# Patient Record
Sex: Male | Born: 1980 | Race: Black or African American | Hispanic: No | Marital: Married | Smoking: Current some day smoker
Health system: Southern US, Community
[De-identification: ages and names within clinical notes are randomized; demographics above are authoritative.]

## PROBLEM LIST (undated history)

## (undated) DIAGNOSIS — K5792 Diverticulitis of intestine, part unspecified, without perforation or abscess without bleeding: Secondary | ICD-10-CM

## (undated) DIAGNOSIS — F419 Anxiety disorder, unspecified: Secondary | ICD-10-CM

## (undated) DIAGNOSIS — K219 Gastro-esophageal reflux disease without esophagitis: Secondary | ICD-10-CM

## (undated) HISTORY — PX: ABDOMINAL SURGERY: SHX537

---

## 2005-01-16 ENCOUNTER — Emergency Department: Payer: Self-pay | Admitting: Emergency Medicine

## 2007-04-15 ENCOUNTER — Emergency Department: Payer: Self-pay | Admitting: Emergency Medicine

## 2007-04-25 ENCOUNTER — Ambulatory Visit: Payer: Self-pay | Admitting: Otolaryngology

## 2008-09-25 HISTORY — PX: NOSE SURGERY: SHX723

## 2010-08-03 ENCOUNTER — Emergency Department: Payer: Self-pay | Admitting: Unknown Physician Specialty

## 2010-09-14 ENCOUNTER — Emergency Department: Payer: Self-pay | Admitting: Emergency Medicine

## 2011-05-23 ENCOUNTER — Emergency Department: Payer: Self-pay | Admitting: *Deleted

## 2011-12-27 DIAGNOSIS — K219 Gastro-esophageal reflux disease without esophagitis: Secondary | ICD-10-CM | POA: Insufficient documentation

## 2011-12-27 DIAGNOSIS — R030 Elevated blood-pressure reading, without diagnosis of hypertension: Secondary | ICD-10-CM | POA: Insufficient documentation

## 2011-12-27 DIAGNOSIS — H6190 Disorder of external ear, unspecified, unspecified ear: Secondary | ICD-10-CM | POA: Insufficient documentation

## 2012-05-15 ENCOUNTER — Emergency Department: Payer: Self-pay | Admitting: *Deleted

## 2012-05-15 LAB — COMPREHENSIVE METABOLIC PANEL
Alkaline Phosphatase: 67 U/L (ref 50–136)
Anion Gap: 5 — ABNORMAL LOW (ref 7–16)
Calcium, Total: 8.7 mg/dL (ref 8.5–10.1)
Co2: 29 mmol/L (ref 21–32)
EGFR (Non-African Amer.): >60
Osmolality: 282 (ref 275–301)
Potassium: 3.7 mmol/L (ref 3.5–5.1)
SGPT (ALT): 30 U/L (ref 12–78)
Sodium: 141 mmol/L (ref 136–145)
Total Protein: 7.8 g/dL (ref 6.4–8.2)

## 2012-05-15 LAB — URINALYSIS, COMPLETE
Bilirubin,UR: NEGATIVE
Glucose,UR: NEGATIVE mg/dL (ref 0–75)
Ketone: NEGATIVE
Nitrite: NEGATIVE
Protein: NEGATIVE
RBC,UR: 1 /HPF (ref 0–5)
Squamous Epithelial: <1
WBC UR: <1 /HPF (ref 0–5)

## 2012-05-15 LAB — CBC
HCT: 44.4 % (ref 40.0–52.0)
HGB: 15.2 g/dL (ref 13.0–18.0)
MCH: 29.7 pg (ref 26.0–34.0)
MCHC: 34.3 g/dL (ref 32.0–36.0)
RDW: 13.3 % (ref 11.5–14.5)

## 2012-08-23 ENCOUNTER — Emergency Department: Payer: Self-pay | Admitting: Emergency Medicine

## 2012-08-23 LAB — CBC WITH DIFFERENTIAL/PLATELET
Basophil #: 0 10*3/uL (ref 0.0–0.1)
Basophil %: 0.4 %
Eosinophil #: 0 10*3/uL (ref 0.0–0.7)
HCT: 47 % (ref 40.0–52.0)
HGB: 16.6 g/dL (ref 13.0–18.0)
Lymphocyte %: 22.5 %
MCHC: 35.3 g/dL (ref 32.0–36.0)
Monocyte %: 10.8 %
Neutrophil #: 7 10*3/uL — ABNORMAL HIGH (ref 1.4–6.5)
Neutrophil %: 65.9 %
RBC: 5.55 10*6/uL (ref 4.40–5.90)
RDW: 13.1 % (ref 11.5–14.5)

## 2012-08-23 LAB — URINALYSIS, COMPLETE
Bacteria: NONE SEEN
Nitrite: NEGATIVE
Ph: 5 (ref 4.5–8.0)
Protein: NEGATIVE
Specific Gravity: 1.027 (ref 1.003–1.030)
Squamous Epithelial: <1
WBC UR: 4 /HPF (ref 0–5)

## 2012-08-23 LAB — COMPREHENSIVE METABOLIC PANEL
Anion Gap: 7 (ref 7–16)
BUN: 18 mg/dL (ref 7–18)
Bilirubin,Total: 1.6 mg/dL — ABNORMAL HIGH (ref 0.2–1.0)
Chloride: 108 mmol/L — ABNORMAL HIGH (ref 98–107)
Co2: 23 mmol/L (ref 21–32)
Creatinine: 1 mg/dL (ref 0.60–1.30)
EGFR (African American): >60
Glucose: 91 mg/dL (ref 65–99)
Osmolality: 277 (ref 275–301)
Potassium: 3.7 mmol/L (ref 3.5–5.1)
SGOT(AST): 50 U/L — ABNORMAL HIGH (ref 15–37)
Sodium: 138 mmol/L (ref 136–145)
Total Protein: 8.5 g/dL — ABNORMAL HIGH (ref 6.4–8.2)

## 2012-08-23 LAB — LIPASE, BLOOD: Lipase: 94 U/L (ref 73–393)

## 2012-11-28 ENCOUNTER — Emergency Department: Payer: Self-pay | Admitting: Emergency Medicine

## 2012-11-28 LAB — COMPREHENSIVE METABOLIC PANEL
Anion Gap: 9 (ref 7–16)
Bilirubin,Total: 0.7 mg/dL (ref 0.2–1.0)
Chloride: 104 mmol/L (ref 98–107)
Creatinine: 0.99 mg/dL (ref 0.60–1.30)
EGFR (Non-African Amer.): >60
Osmolality: 274 (ref 275–301)
Potassium: 3.7 mmol/L (ref 3.5–5.1)
SGOT(AST): 74 U/L — ABNORMAL HIGH (ref 15–37)
SGPT (ALT): 104 U/L — ABNORMAL HIGH (ref 12–78)
Sodium: 137 mmol/L (ref 136–145)
Total Protein: 8.2 g/dL (ref 6.4–8.2)

## 2012-11-28 LAB — CBC
HGB: 15.3 g/dL (ref 13.0–18.0)
MCV: 85 fL (ref 80–100)
Platelet: 188 10*3/uL (ref 150–440)
RBC: 5.28 10*6/uL (ref 4.40–5.90)
WBC: 5.8 10*3/uL (ref 3.8–10.6)

## 2012-11-28 LAB — LIPASE, BLOOD: Lipase: 99 U/L (ref 73–393)

## 2013-03-20 ENCOUNTER — Emergency Department: Payer: Self-pay | Admitting: Emergency Medicine

## 2013-03-20 LAB — URINALYSIS, COMPLETE
Bilirubin,UR: NEGATIVE
Blood: NEGATIVE
Glucose,UR: NEGATIVE mg/dL (ref 0–75)
Leukocyte Esterase: NEGATIVE
Nitrite: NEGATIVE
Protein: NEGATIVE
RBC,UR: 1 /HPF (ref 0–5)

## 2013-03-20 LAB — COMPREHENSIVE METABOLIC PANEL
Albumin: 4.1 g/dL (ref 3.4–5.0)
Alkaline Phosphatase: 81 U/L (ref 50–136)
Anion Gap: 6 — ABNORMAL LOW (ref 7–16)
BUN: 16 mg/dL (ref 7–18)
Bilirubin,Total: 1.5 mg/dL — ABNORMAL HIGH (ref 0.2–1.0)
Co2: 27 mmol/L (ref 21–32)
EGFR (Non-African Amer.): >60
Osmolality: 278 (ref 275–301)
SGOT(AST): 41 U/L — ABNORMAL HIGH (ref 15–37)
SGPT (ALT): 63 U/L (ref 12–78)
Sodium: 139 mmol/L (ref 136–145)

## 2013-03-20 LAB — ETHANOL: Ethanol: <3 mg/dL

## 2013-03-20 LAB — CBC
MCH: 29 pg (ref 26.0–34.0)
Platelet: 191 10*3/uL (ref 150–440)
RDW: 13.6 % (ref 11.5–14.5)
WBC: 4.6 10*3/uL (ref 3.8–10.6)

## 2013-03-20 LAB — DRUG SCREEN, URINE
Barbiturates, Ur Screen: NEGATIVE (ref ?–200)
Cannabinoid 50 Ng, Ur ~~LOC~~: POSITIVE (ref ?–50)
Opiate, Ur Screen: NEGATIVE (ref ?–300)
Phencyclidine (PCP) Ur S: NEGATIVE (ref ?–25)

## 2013-03-20 LAB — TSH: Thyroid Stimulating Horm: 1.21 u[IU]/mL

## 2013-06-10 ENCOUNTER — Emergency Department: Payer: Self-pay | Admitting: Internal Medicine

## 2013-11-07 LAB — COMPREHENSIVE METABOLIC PANEL
ALBUMIN: 4.1 g/dL (ref 3.4–5.0)
Alkaline Phosphatase: 73 U/L
Anion Gap: 8 (ref 7–16)
BUN: 16 mg/dL (ref 7–18)
Bilirubin,Total: 0.7 mg/dL (ref 0.2–1.0)
Calcium, Total: 9 mg/dL (ref 8.5–10.1)
Chloride: 108 mmol/L — ABNORMAL HIGH (ref 98–107)
Co2: 23 mmol/L (ref 21–32)
Creatinine: 1.53 mg/dL — ABNORMAL HIGH (ref 0.60–1.30)
Glucose: 89 mg/dL (ref 65–99)
Osmolality: 278 (ref 275–301)
POTASSIUM: 3.6 mmol/L (ref 3.5–5.1)
SGOT(AST): 14 U/L — ABNORMAL LOW (ref 15–37)
SGPT (ALT): 27 U/L (ref 12–78)
Sodium: 139 mmol/L (ref 136–145)
TOTAL PROTEIN: 9.1 g/dL — AB (ref 6.4–8.2)

## 2013-11-07 LAB — CBC
HCT: 53.3 % — AB (ref 40.0–52.0)
HGB: 18.1 g/dL — AB (ref 13.0–18.0)
MCH: 29.5 pg (ref 26.0–34.0)
MCHC: 33.9 g/dL (ref 32.0–36.0)
MCV: 87 fL (ref 80–100)
Platelet: 206 10*3/uL (ref 150–440)
RBC: 6.13 10*6/uL — ABNORMAL HIGH (ref 4.40–5.90)
RDW: 13.5 % (ref 11.5–14.5)
WBC: 4.9 10*3/uL (ref 3.8–10.6)

## 2013-11-07 LAB — LIPASE, BLOOD: Lipase: 167 U/L (ref 73–393)

## 2013-11-08 LAB — HEMOGLOBIN
HGB: 14.5 g/dL (ref 13.0–18.0)
HGB: 14.2 g/dL (ref 13.0–18.0)

## 2013-11-08 LAB — URINALYSIS, COMPLETE
BACTERIA: NONE SEEN
BILIRUBIN, UR: NEGATIVE
Blood: NEGATIVE
Glucose,UR: NEGATIVE mg/dL (ref 0–75)
Ketone: NEGATIVE
LEUKOCYTE ESTERASE: NEGATIVE
Nitrite: NEGATIVE
PH: 5 (ref 4.5–8.0)
Protein: NEGATIVE
RBC,UR: 1 /HPF (ref 0–5)
Specific Gravity: 1.021 (ref 1.003–1.030)
Squamous Epithelial: NONE SEEN

## 2013-11-08 LAB — SEDIMENTATION RATE
ERYTHROCYTE SED RATE: 2 mm/h (ref 0–15)
ERYTHROCYTE SED RATE: 8 mm/h (ref 0–15)

## 2013-11-08 LAB — CREATININE, SERUM
CREATININE: 1.53 mg/dL — AB (ref 0.60–1.30)
EGFR (African American): >60

## 2013-11-09 ENCOUNTER — Inpatient Hospital Stay: Payer: Self-pay | Admitting: Internal Medicine

## 2013-11-09 LAB — CBC WITH DIFFERENTIAL/PLATELET
Basophil #: 0 10*3/uL (ref 0.0–0.1)
Basophil %: 0.3 %
EOS ABS: 0 10*3/uL (ref 0.0–0.7)
Eosinophil %: 1.1 %
HCT: 40.7 % (ref 40.0–52.0)
HGB: 13.8 g/dL (ref 13.0–18.0)
Lymphocyte #: 1.2 10*3/uL (ref 1.0–3.6)
Lymphocyte %: 28.1 %
MCH: 29.2 pg (ref 26.0–34.0)
MCHC: 34 g/dL (ref 32.0–36.0)
MCV: 86 fL (ref 80–100)
Monocyte #: 0.6 x10 3/mm (ref 0.2–1.0)
Monocyte %: 15.2 %
Neutrophil #: 2.3 10*3/uL (ref 1.4–6.5)
Neutrophil %: 55.3 %
Platelet: 155 10*3/uL (ref 150–440)
RBC: 4.73 10*6/uL (ref 4.40–5.90)
RDW: 13.7 % (ref 11.5–14.5)
WBC: 4.1 10*3/uL (ref 3.8–10.6)

## 2013-11-09 LAB — BASIC METABOLIC PANEL
Anion Gap: 5 — ABNORMAL LOW (ref 7–16)
BUN: 6 mg/dL — AB (ref 7–18)
CALCIUM: 8.4 mg/dL — AB (ref 8.5–10.1)
CHLORIDE: 110 mmol/L — AB (ref 98–107)
CO2: 25 mmol/L (ref 21–32)
Creatinine: 1.4 mg/dL — ABNORMAL HIGH (ref 0.60–1.30)
EGFR (African American): >60
EGFR (Non-African Amer.): >60
GLUCOSE: 85 mg/dL (ref 65–99)
OSMOLALITY: 276 (ref 275–301)
Potassium: 3.5 mmol/L (ref 3.5–5.1)
Sodium: 140 mmol/L (ref 136–145)

## 2013-11-09 LAB — MAGNESIUM: Magnesium: 1.7 mg/dL — ABNORMAL LOW

## 2013-11-09 LAB — PROTEIN, TOTAL: Total Protein: 6.1 g/dL — ABNORMAL LOW (ref 6.4–8.2)

## 2013-11-10 LAB — BASIC METABOLIC PANEL
ANION GAP: 5 — AB (ref 7–16)
BUN: 4 mg/dL — AB (ref 7–18)
CHLORIDE: 108 mmol/L — AB (ref 98–107)
Calcium, Total: 8 mg/dL — ABNORMAL LOW (ref 8.5–10.1)
Co2: 26 mmol/L (ref 21–32)
Creatinine: 1.22 mg/dL (ref 0.60–1.30)
EGFR (Non-African Amer.): >60
Glucose: 86 mg/dL (ref 65–99)
OSMOLALITY: 274 (ref 275–301)
Potassium: 3.3 mmol/L — ABNORMAL LOW (ref 3.5–5.1)
SODIUM: 139 mmol/L (ref 136–145)

## 2013-11-10 LAB — HEMOGLOBIN: HGB: 13.9 g/dL (ref 13.0–18.0)

## 2013-11-10 LAB — PROT IMMUNOELECTROPHORES(ARMC)

## 2014-05-31 ENCOUNTER — Emergency Department: Payer: Self-pay | Admitting: Student

## 2014-05-31 LAB — COMPREHENSIVE METABOLIC PANEL
Albumin: 3.9 g/dL (ref 3.4–5.0)
Alkaline Phosphatase: 83 U/L
Anion Gap: 4 — ABNORMAL LOW (ref 7–16)
BILIRUBIN TOTAL: 1 mg/dL (ref 0.2–1.0)
BUN: 17 mg/dL (ref 7–18)
CO2: 25 mmol/L (ref 21–32)
Calcium, Total: 8.7 mg/dL (ref 8.5–10.1)
Chloride: 108 mmol/L — ABNORMAL HIGH (ref 98–107)
Creatinine: 1.18 mg/dL (ref 0.60–1.30)
Glucose: 83 mg/dL (ref 65–99)
Osmolality: 275 (ref 275–301)
Potassium: 3.5 mmol/L (ref 3.5–5.1)
SGOT(AST): 16 U/L (ref 15–37)
SGPT (ALT): 34 U/L
Sodium: 137 mmol/L (ref 136–145)
Total Protein: 8 g/dL (ref 6.4–8.2)

## 2014-05-31 LAB — CBC WITH DIFFERENTIAL/PLATELET
Basophil #: 0 10*3/uL (ref 0.0–0.1)
Basophil %: 0.5 %
EOS PCT: 0.3 %
Eosinophil #: 0 10*3/uL (ref 0.0–0.7)
HCT: 46.6 % (ref 40.0–52.0)
HGB: 15.4 g/dL (ref 13.0–18.0)
LYMPHS PCT: 25 %
Lymphocyte #: 1.6 10*3/uL (ref 1.0–3.6)
MCH: 28.6 pg (ref 26.0–34.0)
MCHC: 32.9 g/dL (ref 32.0–36.0)
MCV: 87 fL (ref 80–100)
MONO ABS: 0.8 x10 3/mm (ref 0.2–1.0)
MONOS PCT: 12.9 %
NEUTROS ABS: 3.9 10*3/uL (ref 1.4–6.5)
Neutrophil %: 61.3 %
Platelet: 191 10*3/uL (ref 150–440)
RBC: 5.38 10*6/uL (ref 4.40–5.90)
RDW: 13.2 % (ref 11.5–14.5)
WBC: 6.4 10*3/uL (ref 3.8–10.6)

## 2014-05-31 LAB — URINALYSIS, COMPLETE
BLOOD: NEGATIVE
Bilirubin,UR: NEGATIVE
GLUCOSE, UR: NEGATIVE mg/dL (ref 0–75)
Ketone: NEGATIVE
Leukocyte Esterase: NEGATIVE
NITRITE: NEGATIVE
Ph: 5 (ref 4.5–8.0)
SPECIFIC GRAVITY: 1.033 (ref 1.003–1.030)
SQUAMOUS EPITHELIAL: NONE SEEN
WBC UR: NONE SEEN /HPF (ref 0–5)

## 2014-05-31 LAB — LIPASE, BLOOD: Lipase: 106 U/L (ref 73–393)

## 2015-01-15 NOTE — Consult Note (Signed)
Brief Consult Note: Diagnosis: major depression.   Patient was seen by consultant.   Consult note dictated.   Discussed with Attending MD.   Comments: Psychiatry: Patient seen. Patient currently getting treatment for depression and anxiety. Felt panicy and had passing SI but now denies any SI and shows good insight and motivation for treatment. No indication for commitment. Patient prefers to go home and has place to stay. Will suggest release from ER.  Electronic Signatures: Audery Amellapacs, Erabella Kuipers T (MD)  (Signed 27-Jun-14 17:31)  Authored: Brief Consult Note   Last Updated: 27-Jun-14 17:31 by Audery Amellapacs, Renley Gutman T (MD)

## 2015-01-16 NOTE — Consult Note (Signed)
Pt with chills and sweats, soaked his shirt.  VSS afebrile.  WBC 4.1, hgb 13.8, plt 155, creat 1.4, abd still tender LLQ, bowel sounds good.  Plan to continue treatment, often takes 3-4 days to turn diverticulitis around and switch then to oral meds.  Electronic Signatures: Scot JunElliott, Valentine Barney T (MD)  (Signed on 15-Feb-15 08:54)  Authored  Last Updated: 15-Feb-15 08:54 by Scot JunElliott, Gerilynn Mccullars T (MD)

## 2015-01-16 NOTE — H&P (Signed)
PATIENT NAME:  Matthew Chaney, Matthew Chaney MR#:  [id number] DATE OF BIRTH:  10/28/1980  DATE OF ADMISSION:  11/08/2013  REFERRING PHYSICIAN: Mark R. Quale, MD  PRIMARY CARE PHYSICIAN OR CLINIC: None.   CHIEF COMPLAINT: Abdominal pain, bright red blood per rectum.   HISTORY OF PRESENT ILLNESS: This is a 34-year-old male with significant past medical history of gastroesophageal reflux disease, history of 2 episodes of diverticulitis in the past, managed with p.o. antibiotics a few years ago. The patient presents with complaints of abdominal pain over the last 2 to 3 days, nausea, but denies any vomiting, any coffee-ground emesis. As well, the patient reported that he started having bright red blood per rectum as well, having some cold chills as well, which prompted him to come to ED. The patient reports dark tarry stools with bright red blood as well with it. The patient was afebrile upon presentation, but had tachycardia upon presentation at 123. His hemoglobin was elevated at 18.1, and creatinine 1.53. The patient received a total of 2 liters fluid boluses in the ED. Had CT abdomen and pelvis which did show evidence of diverticulosis, but no diverticulitis. Repeat hemoglobin came back at 14.5 after the fluid boluses. The patient still complains of abdominal pain, so ED discussed with GI on-call, Dr. Elliott, who recommended the patient to be admitted, be started on IV antibiotics and to be evaluated by him in the morning. The patient denies any lightheadedness, dizziness, fainting or near-syncope.   PAST MEDICAL HISTORY: GERD.   PAST SURGICAL HISTORY: None.   SOCIAL HISTORY: No smoking. No alcohol. No illicit drug use. Unemployed.   FAMILY HISTORY: Significant for diabetes mellitus. Denies any family history of inflammatory bowel disease.   ALLERGIES: VICODIN.   HOME MEDICATIONS: Nexium.    REVIEW OF SYSTEMS:  CONSTITUTIONAL: Denies any fever. Complains of chills, fatigue, weakness. Denies weight gain,  weight loss.  EYES: Denies blurry vision, double vision, inflammation, glaucoma.  ENT: Denies tinnitus, ear pain, hearing loss, epistaxis or discharge.  RESPIRATORY: Denies cough, wheezing, hemoptysis, dyspnea.  CARDIOVASCULAR: Denies chest pain, edema, arrhythmia, palpitation, syncope.  GASTROINTESTINAL: Denies any coffee-ground emesis or hematemesis. Complains of nausea, had 1 episode of vomiting. Denies any diarrhea. Reports bright red blood per rectum and dark-colored stool.  GENITOURINARY: Denies dysuria, hematuria or renal colic.  ENDOCRINE: Denies polyuria, polydipsia, heat or cold intolerance.  HEMATOLOGY: Denies anemia, easy bruising, bleeding diathesis.  INTEGUMENTARY: Denies acne, rash or skin lesion.  MUSCULOSKELETAL: Denies any swelling, gout or cramps.  NEUROLOGIC: Denies CVA, TIA, ataxia, vertigo, tremor.  PSYCHIATRIC: Denies anxiety, insomnia or depression.   PHYSICAL EXAMINATION:  VITAL SIGNS: Temperature 98.3, pulse 95, respiratory rate 18, blood pressure 128/72, saturating 98% on room air.  GENERAL: Well-nourished male who looks comfortable, in no apparent distress.  HEENT: Head atraumatic, normocephalic. Pupils equally reactive to light. Pink conjunctivae. Anicteric sclerae. Dry oral mucosa.  NECK: Supple. No thyromegaly. No JVD.  CHEST: Good air entry bilaterally. No wheezing, rales, rhonchi.  CARDIOVASCULAR: S1, S2 heard. No rubs, murmurs or gallops.  ABDOMEN: Had tenderness in bilateral right and left lower quadrant. No rebound, no guarding, and hyperperistaltic bowel sounds. No hepatosplenomegaly could be appreciated. No inguinal lymph nodes could be palpated as well.  EXTREMITIES: No edema. No clubbing. No cyanosis.  SKIN: Dry, delay in turgor.  PSYCHIATRIC: Appropriate affect. Awake, alert x3. Intact judgment and insight.  NEUROLOGIC: Cranial nerves grossly intact. Motor 5 out of 5. No focal deficits.  LYMPHATICS: No cervical lymphadenopathy. No inguinal    lymphadenopathy.  MUSCULOSKELETAL: No joint effusion or erythema.   PERTINENT LABORATORY DATA: Glucose 89, BUN 16, creatinine 1.53, sodium 139, potassium 3.6, chloride 108, ALT 27, AST 14, alkaline phosphatase 73. White blood cells 4.9, hemoglobin 18.1, repeat hemoglobin was 14.5 but this was after 2 liters of IV normal saline fluid bolus, hematocrit 53.3, platelets 206. Urinalysis negative for leukocyte esterase and nitrite.   IMAGING STUDIES: CT abdomen and pelvis with contrast showing no acute abnormality in the abdomen or pelvis to explain the patient's clinical symptoms. Moderate colonic diverticulosis, which is advanced for age; however, no CT evidence of active inflammation to suggest acute diverticulitis.   ASSESSMENT AND PLAN:  1. Abdominal pain, nausea and bright red blood per rectum. The patient is most likely having a lower gastrointestinal bleed related to his diverticulosis. His hemoglobin had significant drop, but initially it appears his hemoglobin was elevated due to dehydration, and this is supported by his elevated creatinine level, and most likely the drop in his hemoglobin is dilutional as well. The patient will be started on IV antibiotics as per GI recommendation, unclear if due to diverticulitis or possible inflammatory bowel disease as well. Will continue to monitor his hemoglobin every 8 hours. Will follow the trend as well. Will keep him on clear liquid diet. Will have him on IV Protonix 40 b.i.d. and will add ESR and CRP with the next blood work as well. Will have him on p.r.n. pain and nausea medicine.  2. Gastroesophageal reflux disease. The patient is on PPI.  3. Deep vein thrombosis prophylaxis. Will add on sequential compression devices. Will avoid chemical anticoagulation due to lower gastrointestinal bleed.   CODE STATUS: Full code.   TOTAL TIME SPENT ON ADMISSION AND PATIENT CARE: 45 minutes.   ____________________________ Albertine Patricia,  MD dse:lb D: 11/08/2013 05:15:36 ET T: 11/08/2013 05:44:57 ET JOB#: [id number]  cc: Albertine Patricia, MD, <Dictator> Kyrielle Urbanski Graciela Husbands MD ELECTRONICALLY SIGNED 11/09/2013 0:05

## 2015-01-16 NOTE — Discharge Summary (Signed)
PATIENT NAME:  Matthew LodgeBALDWIN, Ram MR#:  [id number] DATE OF BIRTH:  Feb 18, 1981  DATE OF ADMISSION:  11/09/2013 DATE OF DISCHARGE:  11/11/2013  DISCHARGE DIAGNOSES: 1.  Acute diverticulitis.  2.  Acute blood loss anemia.  3.  Acute renal failure acute renal failure secondary to acute tubular necrosis and dehydration.  4.  Hypokalemia.   CONSULTATIONS:  Dr. Mechele CollinElliott with GI.   PROCEDURES: None.   IMAGING STUDIES DONE: Include a CT scan of the abdomen and pelvis with contrast which showed significant colonic diverticulosis which is advanced for his age. No acute abnormalities.   HISTORY OF PRESENTING ILLNESS: A 34 year old [place] patient with history of prior diverticulitis, presented to the hospital complaining of blood in his stools, acute onset of abdominal pain of the left lower quadrant with fever. The patient was admitted to the hospitalist service with high suspicion for diverticulitis with GI bleed, for acute diverticulitis clinically in spite of being CT negative. The patient was started on broad-spectrum antibiotics. Dr. Mechele CollinElliott with GI was consulted.  The patient, by the day of discharge, has tolerated his food well, does not have any further bleeding. His bleeding was thought to be secondary to diverticulitis. Hemoglobin has decreased secondary to dilution and also blood loss but presently is at 30.5. The patient feels well with minimal abdominal pain well controlled with pain medications and is being discharged back home. The patient will follow up with Dr. Mechele CollinElliott for colonoscopy once his acute illness resolves.   The patient also had acute renal failure secondary to severe dehydration. This has resolved with IV fluids. His hypokalemia was corrected through IV and orally.   Prior to discharge, the patient has mild tenderness in the left lower quadrant, much improved from admission.   DISCHARGE MEDICATIONS: Include:  1.  Nexium 20 mg oral once a day.  2.  Oxycodone 5 mg 1  to 2 tablets every 8 hours as needed, 5-day prescription given.  3.  Ciprofloxacin 500 mg oral 2 times a day for 1 week.  4.  Flagyl 500 mg oral 3 times a day for 1 week.   DISCHARGE INSTRUCTIONS: Regular diet, regular activity. Follow up with Dr. Mechele CollinElliott in 2 to 4 weeks and PCP in 1 week.   TIME SPENT ON DAY OF DISCHARGE IN DISCHARGE ACTIVITY: Was 40 minutes.   ____________________________ Molinda BailiffSrikar R. Rosely Fernandez, MD srs:cs D: 11/12/2013 13:53:54 ET T: 11/12/2013 14:05:43 ET JOB#: [id number]  cc: Wardell HeathSrikar R. Elpidio AnisSudini, MD, <Dictator> Scot Junobert T. Elliott, MD Orie FishermanSRIKAR R Ah Bott MD ELECTRONICALLY SIGNED 11/13/2013 14:14

## 2015-01-16 NOTE — Consult Note (Signed)
Pt consult done.  he had BRBPR and black but he took Pepto Bismol over last 2 days.  His serum protein is elevated so i will order a SPIE, hep C, and also get CRP and sed rate.  Probable diverticulitis not seen on CT.  Will require eventual colonoscopy to rule out other causes of LGI bleeding.  Electronic Signatures: Scot JunElliott, Aaira Oestreicher T (MD)  (Signed on 14-Feb-15 14:29)  Authored  Last Updated: 14-Feb-15 14:29 by Scot JunElliott, Jaquann Guarisco T (MD)

## 2015-01-16 NOTE — Consult Note (Signed)
PATIENT NAME:  Matthew LodgeBALDWIN, Garwood MR#:  [id number] DATE OF BIRTH:  1980-10-05  DATE OF CONSULTATION:  11/08/2013  CONSULTING PHYSICIAN:  Scot Junobert T. Elliott, MD  The patient is a 34 year old black male who presented with abdominal pain over the last few days. Started having bright red blood per rectum. Also reported dark tarry stools with the red blood. Came to the ER, was found to have a tachycardia at 124. Creatinine 1.53. Hemoglobin 18. He received a couple of liters of fluids. Repeat hemoglobin was 14.5. A CAT scan showed diverticulosis but not diverticulitis. I was consulted at 4:30 in the morning by phone and recommended that because of his GI bleeding and CAT scan showing diverticular disease that he should be admitted to the hospital and covered for possible diverticulitis with antibiotics. I was asked to see him in consultation.   FAMILY HISTORY: Mother's side had breast cancer in it. His grandmother had diverticulitis.   The patient has been passing black stools for a day or two, but he also took Pepto-Bismol over the last couple of days.   He complains of heartburn and GERD,  and if he misses Nexium even one day, he gets significant symptoms of heartburn. He denies any normal constipation or diarrhea or recurrent rectal bleeding.   He has had 3 spells of diverticulitis in the past. The last one was 3 years ago. He has not had a colonoscopy before.  REVIEW OF SYSTEMS: Done by the hospitalist and reviewed. Positive for chills, fatigue, weakness. Positive for nausea, vomiting once. Positive for bright red blood and dark-colored stools. The dark could have been coming from Pepto-Bismol. Otherwise, 10-system review of systems was negative.   PHYSICAL EXAMINATION: GENERAL: Black male in no acute distress.  VITAL SIGNS: Temperature 98.3, pulse 90, respirations 18, blood pressure 109/66, pulse oximetry 96% on room air.  HEENT: Sclerae anicteric. Conjunctivae negative. Tongue is negative. Head is  atraumatic.  CHEST: Clear.  HEART: No murmurs, gallops, clicks, or rubs I can hear.  ABDOMEN: Some mild tenderness in the left lower quadrant.  EXTREMITIES: No edema.  SKIN: Warm and dry.  PSYCHIATRIC: Mood and affect are appropriate.   LABORATORY AND RADIOLOGICAL DATA: Glucose 89, BUN 16, creatinine 1.53, sodium 139, potassium 3.6, chloride 108, ALT 27, AST 14, alkaline phosphatase 73. White count 4.9, hemoglobin 18.1; repeat is 14.5 with hydration. Urinalysis negative for indications of infection. CAT scan of the abdomen showed no acute diverticulitis, did show diverticulosis.   ASSESSMENT AND PLAN: Probable gastrointestinal bleeding from diverticular disease. Sometimes a CAT scan can miss focal mild diverticulitis. I have seen this phenomenon a few times. I think it would be reasonable to keep him in the hospital a few days on IV antibiotics and monitor his hemoglobin.   He does have an elevated total protein of 9.1, and even though his liver functions are normal, it might be reasonable to get a hepatitis C study because of the elevated total protein and to get a serum protein immunoelectrophoresis. Recommend a CBC again tomorrow.   I would hold colonoscopy at this time because of the possibility of a colonoscopic perforation being increased with diverticulitis. He should have a colonoscopy sometime in the future, several weeks from now.  ____________________________ Scot Junobert T. Elliott, MD rte:jcm D: 11/08/2013 14:26:52 ET T: 11/08/2013 15:06:50 ET JOB#: [id number]  cc: Scot Junobert T. Elliott, MD, <Dictator> Mark R. Fanny BienQuale, MD Scot JunOBERT T ELLIOTT MD ELECTRONICALLY SIGNED 12/04/2013 16:36

## 2015-06-20 ENCOUNTER — Emergency Department
Admission: EM | Admit: 2015-06-20 | Discharge: 2015-06-20 | Disposition: A | Discharge status: Home / Self Care | Payer: Self-pay | Attending: Student | Admitting: Student

## 2015-06-20 ENCOUNTER — Encounter: Payer: Self-pay | Admitting: Emergency Medicine

## 2015-06-20 DIAGNOSIS — R0981 Nasal congestion: Secondary | ICD-10-CM | POA: Insufficient documentation

## 2015-06-20 DIAGNOSIS — J029 Acute pharyngitis, unspecified: Secondary | ICD-10-CM | POA: Insufficient documentation

## 2015-06-20 HISTORY — DX: Gastro-esophageal reflux disease without esophagitis: K21.9

## 2015-06-20 LAB — POCT RAPID STREP A: STREPTOCOCCUS, GROUP A SCREEN (DIRECT): NEGATIVE

## 2015-06-20 MED ORDER — LIDOCAINE VISCOUS 2 % MT SOLN
OROMUCOSAL | Status: AC
  Filled 2015-06-20: qty 15

## 2015-06-20 MED ORDER — LIDOCAINE VISCOUS 2 % MT SOLN
15.0000 mL | Freq: Once | OROMUCOSAL | Status: AC
  Administered 2015-06-20: 15 mL via OROMUCOSAL

## 2015-06-20 MED ORDER — MAGIC MOUTHWASH: 5.0000 mL | Freq: Four times a day (QID) | ORAL | Status: DC

## 2015-06-20 NOTE — ED Provider Notes (Signed)
[facility] Department Allyana Vogan Note  ____________________________________________  Time seen: Approximately 4:13 PM  I have reviewed the triage vital signs and the nursing notes.   HISTORY  Chief Complaint Sore Throat  HPI Matthew Chaney is a 34 y.o. male is here with complaint of sore throat for the last several days. He is unaware of any fever or chills. He has had some nasal congestion and some mild sneezing. He has not taken any over-the-counter medication for his pain. He denies any exposure to strep throat that he is aware of. Swallowing increases his pain and nothing has helped it.Currently he rates his pain as a 6 or 7 out of 10.   Past Medical History  Diagnosis Date  . GERD (gastroesophageal reflux disease)     There are no active problems to display for this patient.   History reviewed. No pertinent past surgical history.  Current Outpatient Rx  Name  Route  Sig  Dispense  Refill  . magic mouthwash SOLN   Oral   Take 5 mLs by mouth 4 (four) times daily.   100 mL   0     Allergies Vicodin  No family history on file.  Social History Social History  Substance Use Topics  . Smoking status: Never Smoker   . Smokeless tobacco: None  . Alcohol Use: No    Review of Systems Constitutional: No fever/chills Eyes: No visual changes. ENT: Positive sore throat. Cardiovascular: Denies chest pain. Respiratory: Denies shortness of breath. Gastrointestinal:   No nausea, no vomiting.  Musculoskeletal: Negative for back pain. Skin: Negative for rash. Neurological: Negative for headaches, focal weakness or numbness.  10-point ROS otherwise negative.  ____________________________________________   PHYSICAL EXAM:  VITAL SIGNS: ED Triage Vitals  Enc Vitals Group     BP 06/20/15 1433 145/110 mmHg     Pulse Rate 06/20/15 1433 95     Resp 06/20/15 1433 18     Temp 06/20/15 1433 98.5 F (36.9 C)     Temp Source 06/20/15  1433 Oral     SpO2 06/20/15 1433 98 %     Weight 06/20/15 1433 180 lb (81.647 kg)     Height 06/20/15 1433  (1.753 m)     Head Cir --      Peak Flow --      Pain Score --      Pain Loc --      Pain Edu? --      Excl. in GC? --     Constitutional: Alert and oriented. Well appearing and in no acute distress. Eyes: Conjunctivae are normal. PERRL. EOMI. Head: Atraumatic. Nose: No congestion/rhinnorhea.   EACs and TMs are clear bilaterally Mouth/Throat: Mucous membranes are moist.  Oropharynx non-erythematous. Moderate posterior drainage was seen. No exudate is noted Neck: No stridor.   Hematological/Lymphatic/Immunilogical: No cervical lymphadenopathy. Cardiovascular: Normal rate, regular rhythm. Grossly normal heart sounds.  Good peripheral circulation. Respiratory: Normal respiratory effort.  No retractions. Lungs CTAB. Gastrointestinal: Soft and nontender. No distention. Musculoskeletal: No lower extremity tenderness nor edema.  No joint effusions. Neurologic:  Normal speech and language. No gross focal neurologic deficits are appreciated. No gait instability. Skin:  Skin is warm, dry and intact. No rash noted. Psychiatric: Mood and affect are normal. Speech and behavior are normal.  ____________________________________________   LABS (all labs ordered are listed, but only abnormal results are displayed)  Labs Reviewed  CULTURE, GROUP A STREP ([facility] ONLY)  POCT RAPID STREP A  _ PROCEDURES  Procedure(s) performed: None  Critical Care performed: No  ____________________________________________   INITIAL IMPRESSION / ASSESSMENT AND PLAN / ED COURSE  Pertinent labs & imaging results that were available during my care of the patient were reviewed by me and considered in my medical decision making (see chart for details).  Strep test was negative. Patient was told to begin taking Claritin as he has in the past for allergies. He is given prescription for Magic  mouthwash to use when necessary pain prior to eating. He is also to increase fluids. ____________________________________________   FINAL CLINICAL IMPRESSION(S) / ED DIAGNOSES  Final diagnoses:  Acute pharyngitis, unspecified pharyngitis type      Tommi Rumps, PA-C 06/20/15 2117  Gayla Doss, MD 06/21/15 2320

## 2015-06-20 NOTE — Discharge Instructions (Signed)
Again taking Claritin-D for allergies Use Magic mouthwash as needed for mouth pain Tylenol or ibuprofen as needed for throat pain

## 2015-06-20 NOTE — ED Notes (Signed)
Sore throat for the past couple of days  

## 2015-06-22 LAB — CULTURE, GROUP A STREP (THRC)

## 2015-12-23 ENCOUNTER — Emergency Department
Admission: EM | Admit: 2015-12-23 | Discharge: 2015-12-23 | Disposition: A | Discharge status: Home / Self Care | Payer: No Typology Code available for payment source | Attending: Emergency Medicine | Admitting: Emergency Medicine

## 2015-12-23 ENCOUNTER — Emergency Department: Payer: No Typology Code available for payment source

## 2015-12-23 ENCOUNTER — Encounter: Payer: Self-pay | Admitting: Emergency Medicine

## 2015-12-23 DIAGNOSIS — F17200 Nicotine dependence, unspecified, uncomplicated: Secondary | ICD-10-CM | POA: Insufficient documentation

## 2015-12-23 DIAGNOSIS — K5732 Diverticulitis of large intestine without perforation or abscess without bleeding: Secondary | ICD-10-CM | POA: Insufficient documentation

## 2015-12-23 DIAGNOSIS — Z79899 Other long term (current) drug therapy: Secondary | ICD-10-CM | POA: Insufficient documentation

## 2015-12-23 HISTORY — DX: Diverticulitis of intestine, part unspecified, without perforation or abscess without bleeding: K57.92

## 2015-12-23 LAB — URINALYSIS COMPLETE WITH MICROSCOPIC (ARMC ONLY)
Bacteria, UA: NONE SEEN
Bilirubin Urine: NEGATIVE
Glucose, UA: NEGATIVE mg/dL
Hgb urine dipstick: NEGATIVE
KETONES UR: NEGATIVE mg/dL
Leukocytes, UA: NEGATIVE
Nitrite: NEGATIVE
PROTEIN: NEGATIVE mg/dL
Specific Gravity, Urine: 1.034 — ABNORMAL HIGH (ref 1.005–1.030)
pH: 5 (ref 5.0–8.0)

## 2015-12-23 LAB — COMPREHENSIVE METABOLIC PANEL
ALBUMIN: 4.2 g/dL (ref 3.5–5.0)
ALK PHOS: 71 U/L (ref 38–126)
ALT: 31 U/L (ref 17–63)
AST: 27 U/L (ref 15–41)
Anion gap: 6 (ref 5–15)
BUN: 20 mg/dL (ref 6–20)
CALCIUM: 8.6 mg/dL — AB (ref 8.9–10.3)
CO2: 26 mmol/L (ref 22–32)
CREATININE: 1.14 mg/dL (ref 0.61–1.24)
Chloride: 105 mmol/L (ref 101–111)
GFR calc Af Amer: >60 mL/min (ref >60)
GFR calc non Af Amer: >60 mL/min (ref >60)
GLUCOSE: 97 mg/dL (ref 65–99)
Potassium: 3.3 mmol/L — ABNORMAL LOW (ref 3.5–5.1)
SODIUM: 137 mmol/L (ref 135–145)
Total Bilirubin: 1.1 mg/dL (ref 0.3–1.2)
Total Protein: 7.9 g/dL (ref 6.5–8.1)

## 2015-12-23 LAB — CBC
HCT: 44.1 % (ref 40.0–52.0)
HEMOGLOBIN: 14.8 g/dL (ref 13.0–18.0)
MCH: 28.3 pg (ref 26.0–34.0)
MCHC: 33.7 g/dL (ref 32.0–36.0)
MCV: 84.1 fL (ref 80.0–100.0)
PLATELETS: 185 10*3/uL (ref 150–440)
RBC: 5.24 MIL/uL (ref 4.40–5.90)
RDW: 13.7 % (ref 11.5–14.5)
WBC: 7.6 10*3/uL (ref 3.8–10.6)

## 2015-12-23 LAB — LIPASE, BLOOD: Lipase: 24 U/L (ref 11–51)

## 2015-12-23 MED ORDER — DIATRIZOATE MEGLUMINE & SODIUM 66-10 % PO SOLN
15.0000 mL | Freq: Once | ORAL | Status: AC
  Administered 2015-12-23: 15 mL via ORAL

## 2015-12-23 MED ORDER — OXYCODONE-ACETAMINOPHEN 5-325 MG PO TABS: 1.0000 | ORAL_TABLET | ORAL | Status: DC | PRN

## 2015-12-23 MED ORDER — CIPROFLOXACIN HCL 500 MG PO TABS: 500.0000 mg | ORAL_TABLET | Freq: Two times a day (BID) | ORAL | Status: AC

## 2015-12-23 MED ORDER — ONDANSETRON HCL 4 MG/2ML IJ SOLN
4.0000 mg | Freq: Once | INTRAMUSCULAR | Status: AC
  Administered 2015-12-23: 4 mg via INTRAVENOUS
  Filled 2015-12-23: qty 2

## 2015-12-23 MED ORDER — MORPHINE SULFATE (PF) 4 MG/ML IV SOLN
4.0000 mg | Freq: Once | INTRAVENOUS | Status: AC
  Administered 2015-12-23: 4 mg via INTRAVENOUS
  Filled 2015-12-23: qty 1

## 2015-12-23 MED ORDER — METRONIDAZOLE 500 MG PO TABS
500.0000 mg | ORAL_TABLET | Freq: Once | ORAL | Status: AC
  Administered 2015-12-23: 500 mg via ORAL
  Filled 2015-12-23: qty 1

## 2015-12-23 MED ORDER — METRONIDAZOLE 500 MG PO TABS: 500.0000 mg | ORAL_TABLET | Freq: Two times a day (BID) | ORAL | Status: AC

## 2015-12-23 MED ORDER — CIPROFLOXACIN HCL 500 MG PO TABS
500.0000 mg | ORAL_TABLET | Freq: Once | ORAL | Status: AC
Start: 2015-12-23 — End: 2015-12-23
  Administered 2015-12-23: 500 mg via ORAL
  Filled 2015-12-23: qty 1

## 2015-12-23 MED ORDER — IOPAMIDOL (ISOVUE-300) INJECTION 61%
100.0000 mL | Freq: Once | INTRAVENOUS | Status: AC | PRN
  Administered 2015-12-23: 100 mL via INTRAVENOUS

## 2015-12-23 NOTE — ED Provider Notes (Signed)
[facility] Emergency Department Provider Note  ____________________________________________  Time seen: 5:00 AM  I have reviewed the triage vital signs and the nursing notes.   HISTORY  Chief Complaint Abdominal Pain     HPI Jontez Redfield is a 35 y.o. male with history of GERD and diverticulitis presents with left lower quadrant abdominal pain that is currently 9 out of 10 accompanied by nausea however no vomiting or diarrhea. Patient states that he's had a history of diverticulitis in the past which required hospitalization. Patient denies any fever afebrile on presentation 97.4 heart rate of 102.     Past Medical History  Diagnosis Date  . GERD (gastroesophageal reflux disease)   . Diverticulitis     There are no active problems to display for this patient.   Past Surgical History  Procedure Laterality Date  . Nose surgery      Current Outpatient Rx  Name  Route  Sig  Dispense  Refill  . magic mouthwash SOLN   Oral   Take 5 mLs by mouth 4 (four) times daily.   100 mL   0     Allergies Vicodin  No family history on file.  Social History Social History  Substance Use Topics  . Smoking status: Current Some Day Smoker  . Smokeless tobacco: None  . Alcohol Use: Yes    Review of Systems  Constitutional: Negative for fever. Eyes: Negative for visual changes. ENT: Negative for sore throat. Cardiovascular: Negative for chest pain. Respiratory: Negative for shortness of breath. Gastrointestinal: Positive for abdominal pain, negative for vomiting and diarrhea. Genitourinary: Negative for dysuria. Musculoskeletal: Negative for back pain. Skin: Negative for rash. Neurological: Negative for headaches, focal weakness or numbness.   10-point ROS otherwise negative.  ____________________________________________   PHYSICAL EXAM:  VITAL SIGNS: ED Triage Vitals  Enc Vitals Group     BP 12/23/15 0008 107/85 mmHg     Pulse Rate  12/23/15 0008 102     Resp 12/23/15 0008 20     Temp 12/23/15 0008 97.9 F (36.6 C)     Temp Source 12/23/15 0008 Oral     SpO2 12/23/15 0008 97 %     Weight 12/23/15 0008 185 lb (83.915 kg)     Height 12/23/15 0008  (1.753 m)     Head Cir --      Peak Flow --      Pain Score 12/23/15 0009 10     Pain Loc --      Pain Edu? --      Excl. in GC? --      Constitutional: Alert and oriented. Well appearing and in no distress. Eyes: Conjunctivae are normal. PERRL. Normal extraocular movements. ENT   Head: Normocephalic and atraumatic.   Nose: No congestion/rhinnorhea.   Mouth/Throat: Mucous membranes are moist.   Neck: No stridor. Hematological/Lymphatic/Immunilogical: No cervical lymphadenopathy. Cardiovascular: Normal rate, regular rhythm. Normal and symmetric distal pulses are present in all extremities. No murmurs, rubs, or gallops. Respiratory: Normal respiratory effort without tachypnea nor retractions. Breath sounds are clear and equal bilaterally. No wheezes/rales/rhonchi. Gastrointestinal:Left upper quadrant/left lower quadrant tenderness to palpation. No distention. There is no CVA tenderness. Genitourinary: deferred Musculoskeletal: Nontender with normal range of motion in all extremities. No joint effusions.  No lower extremity tenderness nor edema. Neurologic:  Normal speech and language. No gross focal neurologic deficits are appreciated. Speech is normal.  Skin:  Skin is warm, dry and intact. No rash noted. Psychiatric: Mood and  affect are normal. Speech and behavior are normal. Patient exhibits appropriate insight and judgment.  ____________________________________________    LABS (pertinent positives/negatives)  Labs Reviewed  COMPREHENSIVE METABOLIC PANEL - Abnormal; Notable for the following:    Potassium 3.3 (*)    Calcium 8.6 (*)    All other components within normal limits  URINALYSIS COMPLETEWITH MICROSCOPIC ([facility] ONLY) - Abnormal;  Notable for the following:    Color, Urine YELLOW (*)    APPearance CLEAR (*)    Specific Gravity, Urine 1.034 (*)    Squamous Epithelial / LPF 0-5 (*)    All other components within normal limits  LIPASE, BLOOD  CBC       RADIOLOGY CT Abdomen Pelvis W Contrast (Final result) Result time: 12/23/15 07:04:30   Final result by Rad Results In Interface (12/23/15 07:04:30)   Narrative:   CLINICAL DATA: Left upper and lower quadrant pain.  EXAM: CT ABDOMEN AND PELVIS WITH CONTRAST  TECHNIQUE: Multidetector CT imaging of the abdomen and pelvis was performed using the standard protocol following bolus administration of intravenous contrast.  CONTRAST: 100mL ISOVUE-300 IOPAMIDOL (ISOVUE-300) INJECTION 61%  COMPARISON: 10/29/2013  FINDINGS: Lower chest and abdominal wall: Simple intramuscular lipoma in the right posterior body wall measuring 20 mm.  Hepatobiliary: No focal liver abnormality.No evidence of biliary obstruction or stone.  Pancreas: Unremarkable.  Spleen: Unremarkable.  Adrenals/Urinary Tract: Negative adrenals. No hydronephrosis or stone. Unremarkable bladder.  Reproductive:No pathologic findings.  Stomach/Bowel: Extensive colonic diverticulosis, especially for age, with active inflammation around a mid descending colonic diverticulum. In this region there is a short segment of colonic wall thickening with submucosal low density edematous appearance. There is no abscess or perforation. Probable internal hemorrhoids. No appendicitis.  Vascular/Lymphatic: No acute vascular abnormality. No mass or adenopathy.  Peritoneal: No pneumoperitoneum  Musculoskeletal: No acute abnormalities.  IMPRESSION: 1. Uncomplicated descending diverticulitis. 2. Extensive colonic diverticulosis, especially for age.   Electronically Signed By: Marnee SpringJonathon Watts M.D. On: 12/23/2015 07:04           INITIAL IMPRESSION / ASSESSMENT AND PLAN / ED  COURSE  Pertinent labs & imaging results that were available during my care of the patient were reviewed by me and considered in my medical decision making (see chart for details).  Patient received IV morphine and Zofran for analgesia and antiemetic respectively. Given history physical exam we'll obtain CT scan of the abdomen to evaluate for diverticulitis  ____________________________________________   FINAL CLINICAL IMPRESSION(S) / ED DIAGNOSES  Final diagnoses:  Diverticulitis of large intestine without perforation or abscess without bleeding      Darci Currentandolph N Brown, MD 12/23/15 0730

## 2015-12-23 NOTE — Discharge Instructions (Signed)
Diverticulitis °Diverticulitis is inflammation or infection of small pouches in your colon that form when you have a condition called diverticulosis. The pouches in your colon are called diverticula. Your colon, or large intestine, is where water is absorbed and stool is formed. °Complications of diverticulitis can include: °· Bleeding. °· Severe infection. °· Severe pain. °· Perforation of your colon. °· Obstruction of your colon. °CAUSES  °Diverticulitis is caused by bacteria. °Diverticulitis happens when stool becomes trapped in diverticula. This allows bacteria to grow in the diverticula, which can lead to inflammation and infection. °RISK FACTORS °People with diverticulosis are at risk for diverticulitis. Eating a diet that does not include enough fiber from fruits and vegetables may make diverticulitis more likely to develop. °SYMPTOMS  °Symptoms of diverticulitis may include: °· Abdominal pain and tenderness. The pain is normally located on the left side of the abdomen, but may occur in other areas. °· Fever and chills. °· Bloating. °· Cramping. °· Nausea. °· Vomiting. °· Constipation. °· Diarrhea. °· Blood in your stool. °DIAGNOSIS  °Your health care provider will ask you about your medical history and do a physical exam. You may need to have tests done because many medical conditions can cause the same symptoms as diverticulitis. Tests may include: °· Blood tests. °· Urine tests. °· Imaging tests of the abdomen, including X-rays and CT scans. °When your condition is under control, your health care provider may recommend that you have a colonoscopy. A colonoscopy can show how severe your diverticula are and whether something else is causing your symptoms. °TREATMENT  °Most cases of diverticulitis are mild and can be treated at home. Treatment may include: °· Taking over-the-counter pain medicines. °· Following a clear liquid diet. °· Taking antibiotic medicines by mouth for 7-10 days. °More severe cases may  be treated at a hospital. Treatment may include: °· Not eating or drinking. °· Taking prescription pain medicine. °· Receiving antibiotic medicines through an IV tube. °· Receiving fluids and nutrition through an IV tube. °· Surgery. °HOME CARE INSTRUCTIONS  °· Follow your health care provider's instructions carefully. °· Follow a full liquid diet or other diet as directed by your health care provider. After your symptoms improve, your health care provider may tell you to change your diet. He or she may recommend you eat a high-fiber diet. Fruits and vegetables are good sources of fiber. Fiber makes it easier to pass stool. °· Take fiber supplements or probiotics as directed by your health care provider. °· Only take medicines as directed by your health care provider. °· Keep all your follow-up appointments. °SEEK MEDICAL CARE IF:  °· Your pain does not improve. °· You have a hard time eating food. °· Your bowel movements do not return to normal. °SEEK IMMEDIATE MEDICAL CARE IF:  °· Your pain becomes worse. °· Your symptoms do not get better. °· Your symptoms suddenly get worse. °· You have a fever. °· You have repeated vomiting. °· You have bloody or black, tarry stools. °MAKE SURE YOU:  °· Understand these instructions. °· Will watch your condition. °· Will get help right away if you are not doing well or get worse. °  °This information is not intended to replace advice given to you by your health care provider. Make sure you discuss any questions you have with your health care provider. °  °Document Released: 06/21/2005 Document Revised: 09/16/2013 Document Reviewed: 08/06/2013 °Elsevier Interactive Patient Education ©2016 [employer] ° °

## 2015-12-23 NOTE — ED Notes (Signed)
Pt discharged home after verbalizing understanding of discharge instructions; nad noted. 

## 2015-12-23 NOTE — ED Notes (Signed)
[place] alert & oriented with NAD noted; states that his pain medicine "wore off really fast" and he would like some more.
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
## 2016-03-21 DIAGNOSIS — K5732 Diverticulitis of large intestine without perforation or abscess without bleeding: Secondary | ICD-10-CM | POA: Insufficient documentation

## 2016-03-21 DIAGNOSIS — Z79899 Other long term (current) drug therapy: Secondary | ICD-10-CM | POA: Insufficient documentation

## 2016-03-21 DIAGNOSIS — F17200 Nicotine dependence, unspecified, uncomplicated: Secondary | ICD-10-CM | POA: Insufficient documentation

## 2016-03-22 ENCOUNTER — Emergency Department: Payer: Self-pay

## 2016-03-22 ENCOUNTER — Emergency Department
Admission: EM | Admit: 2016-03-22 | Discharge: 2016-03-22 | Disposition: A | Discharge status: Home / Self Care | Payer: Self-pay | Attending: Emergency Medicine | Admitting: Emergency Medicine

## 2016-03-22 DIAGNOSIS — R1032 Left lower quadrant pain: Secondary | ICD-10-CM

## 2016-03-22 DIAGNOSIS — K5732 Diverticulitis of large intestine without perforation or abscess without bleeding: Secondary | ICD-10-CM

## 2016-03-22 LAB — URINALYSIS COMPLETE WITH MICROSCOPIC (ARMC ONLY)
BILIRUBIN URINE: NEGATIVE
Bacteria, UA: NONE SEEN
GLUCOSE, UA: NEGATIVE mg/dL
HGB URINE DIPSTICK: NEGATIVE
Ketones, ur: NEGATIVE mg/dL
Leukocytes, UA: NEGATIVE
NITRITE: NEGATIVE
Protein, ur: NEGATIVE mg/dL
RBC / HPF: NONE SEEN RBC/hpf (ref 0–5)
Specific Gravity, Urine: 1.018 (ref 1.005–1.030)
Squamous Epithelial / LPF: NONE SEEN
pH: 5 (ref 5.0–8.0)

## 2016-03-22 LAB — COMPREHENSIVE METABOLIC PANEL
ALT: 34 U/L (ref 17–63)
ANION GAP: 8 (ref 5–15)
AST: 26 U/L (ref 15–41)
Albumin: 4.3 g/dL (ref 3.5–5.0)
Alkaline Phosphatase: 64 U/L (ref 38–126)
BUN: 15 mg/dL (ref 6–20)
CHLORIDE: 105 mmol/L (ref 101–111)
CO2: 26 mmol/L (ref 22–32)
Calcium: 8.8 mg/dL — ABNORMAL LOW (ref 8.9–10.3)
Creatinine, Ser: 1.1 mg/dL (ref 0.61–1.24)
GFR calc non Af Amer: >60 mL/min (ref >60)
Glucose, Bld: 84 mg/dL (ref 65–99)
Potassium: 3.5 mmol/L (ref 3.5–5.1)
SODIUM: 139 mmol/L (ref 135–145)
Total Bilirubin: 1 mg/dL (ref 0.3–1.2)
Total Protein: 7.6 g/dL (ref 6.5–8.1)

## 2016-03-22 LAB — LIPASE, BLOOD: Lipase: 20 U/L (ref 11–51)

## 2016-03-22 LAB — CBC
HCT: 43 % (ref 40.0–52.0)
Hemoglobin: 14.7 g/dL (ref 13.0–18.0)
MCH: 28.6 pg (ref 26.0–34.0)
MCHC: 34.2 g/dL (ref 32.0–36.0)
MCV: 83.8 fL (ref 80.0–100.0)
PLATELETS: 202 10*3/uL (ref 150–440)
RBC: 5.14 MIL/uL (ref 4.40–5.90)
RDW: 13.5 % (ref 11.5–14.5)
WBC: 9 10*3/uL (ref 3.8–10.6)

## 2016-03-22 LAB — LACTIC ACID, PLASMA: Lactic Acid, Venous: 0.8 mmol/L (ref 0.5–1.9)

## 2016-03-22 MED ORDER — HYDROMORPHONE HCL 1 MG/ML IJ SOLN
1.0000 mg | Freq: Once | INTRAMUSCULAR | Status: AC
  Administered 2016-03-22: 1 mg via INTRAVENOUS
  Filled 2016-03-22: qty 1

## 2016-03-22 MED ORDER — CIPROFLOXACIN IN D5W 400 MG/200ML IV SOLN
400.0000 mg | Freq: Once | INTRAVENOUS | Status: AC
  Administered 2016-03-22: 400 mg via INTRAVENOUS
  Filled 2016-03-22: qty 200

## 2016-03-22 MED ORDER — SODIUM CHLORIDE 0.9 % IV BOLUS (SEPSIS)
1000.0000 mL | Freq: Once | INTRAVENOUS | Status: AC
  Administered 2016-03-22: 1000 mL via INTRAVENOUS

## 2016-03-22 MED ORDER — DIATRIZOATE MEGLUMINE & SODIUM 66-10 % PO SOLN
15.0000 mL | Freq: Once | ORAL | Status: AC
  Administered 2016-03-22: 15 mL via ORAL

## 2016-03-22 MED ORDER — IOPAMIDOL (ISOVUE-300) INJECTION 61%
100.0000 mL | Freq: Once | INTRAVENOUS | Status: AC | PRN
  Administered 2016-03-22: 100 mL via INTRAVENOUS

## 2016-03-22 MED ORDER — METRONIDAZOLE IN NACL 5-0.79 MG/ML-% IV SOLN
500.0000 mg | Freq: Once | INTRAVENOUS | Status: AC
  Administered 2016-03-22: 500 mg via INTRAVENOUS
  Filled 2016-03-22: qty 100

## 2016-03-22 MED ORDER — CIPROFLOXACIN HCL 500 MG PO TABS
500.0000 mg | ORAL_TABLET | Freq: Two times a day (BID) | ORAL | Status: DC
Start: 2016-03-22 — End: 2016-05-07

## 2016-03-22 MED ORDER — OXYCODONE-ACETAMINOPHEN 5-325 MG PO TABS: 1.0000 | ORAL_TABLET | ORAL | Status: DC | PRN

## 2016-03-22 MED ORDER — METRONIDAZOLE 500 MG PO TABS: 500.0000 mg | ORAL_TABLET | Freq: Two times a day (BID) | ORAL | Status: DC

## 2016-03-22 MED ORDER — ONDANSETRON HCL 4 MG/2ML IJ SOLN
4.0000 mg | Freq: Once | INTRAMUSCULAR | Status: AC
  Administered 2016-03-22: 4 mg via INTRAVENOUS
  Filled 2016-03-22: qty 2

## 2016-03-22 MED ORDER — ONDANSETRON 4 MG PO TBDP: 4.0000 mg | ORAL_TABLET | Freq: Three times a day (TID) | ORAL | Status: DC | PRN

## 2016-03-22 NOTE — ED Notes (Signed)
Pt in with co left sided abd pain since yest hx of diverticulitis.

## 2016-03-22 NOTE — ED Notes (Signed)
Pt transported to CT via stretcher.  

## 2016-03-22 NOTE — ED Notes (Addendum)
Pt states hx of diverticulitis. Presents with L sided lower abdominal pain, states "it's kinda on my side." states pain began yesterday, states "it feels like a rock on my side." Reports taking tylenol with no relief. Denies urinary symptoms or hx of kidney stones.

## 2016-03-22 NOTE — ED Provider Notes (Signed)
[facility] Emergency Department Provider Note   ____________________________________________  Time seen: Approximately 2:40 AM  I have reviewed the triage vital signs and the nursing notes.   HISTORY  Chief Complaint Abdominal Pain    HPI Matthew LodgeJoshua Chaney is a 35 y.o. male who presents to the ED from home with a chief complaint abdominal pain. Patient complains of left lower quadrant pain since yesterday. Symptoms are not associated with fever, chills, chest pain, shortness of breath, nausea, vomiting, diarrhea or dysuria. Patient has a history of diverticulitis and states he has had 4 prior episodes. No history of kidney stones. Patient was out of town this weekend and ate a sesame seed hamburger bun as well as drink alcohol. Also states he feels constipated. Usually has a bowel movement daily but did not have one yesterday. Denies recent trauma. Nothing makes his symptoms better or worse.   Past Medical History  Diagnosis Date  . GERD (gastroesophageal reflux disease)   . Diverticulitis     There are no active problems to display for this patient.   Past Surgical History  Procedure Laterality Date  . Nose surgery      Current Outpatient Rx  Name  Route  Sig  Dispense  Refill  . esomeprazole (NEXIUM) 20 MG capsule   Oral   Take 20 mg by mouth daily at 12 noon.         . ciprofloxacin (CIPRO) 500 MG tablet   Oral   Take 1 tablet (500 mg total) by mouth 2 (two) times daily.   14 tablet   0   . metroNIDAZOLE (FLAGYL) 500 MG tablet   Oral   Take 1 tablet (500 mg total) by mouth 2 (two) times daily.   14 tablet   0   . ondansetron (ZOFRAN ODT) 4 MG disintegrating tablet   Oral   Take 1 tablet (4 mg total) by mouth every 8 (eight) hours as needed for nausea or vomiting.   20 tablet   0   . oxyCODONE-acetaminophen (ROXICET) 5-325 MG tablet   Oral   Take 1 tablet by mouth every 4 (four) hours as needed for severe pain.   20 tablet  0     Allergies Vicodin  No family history on file.  Social History Social History  Substance Use Topics  . Smoking status: Current Some Day Smoker  . Smokeless tobacco: None  . Alcohol Use: Yes    Review of Systems  Constitutional: No fever/chills. Eyes: No visual changes. ENT: No sore throat. Cardiovascular: Denies chest pain. Respiratory: Denies shortness of breath. Gastrointestinal: Positive for abdominal pain.  No nausea, no vomiting.  No diarrhea.  Positive for constipation. Genitourinary: Negative for dysuria. Musculoskeletal: Negative for back pain. Skin: Negative for rash. Neurological: Negative for headaches, focal weakness or numbness.  10-point ROS otherwise negative.  ____________________________________________   PHYSICAL EXAM:  VITAL SIGNS: ED Triage Vitals  Enc Vitals Group     BP 03/22/16 0007 118/76 mmHg     Pulse Rate 03/22/16 0006 89     Resp 03/22/16 0006 18     Temp 03/22/16 0006 98.2 F (36.8 C)     Temp Source 03/22/16 0006 Oral     SpO2 03/22/16 0006 100 %     Weight 03/22/16 0006 185 lb (83.915 kg)     Height 03/22/16 0006 5\' 9"  (1.753 m)     Head Cir --      Peak Flow --  Pain Score 03/22/16 0007 9     Pain Loc --      Pain Edu? --      Excl. in GC? --     Constitutional: Alert and oriented. Well appearing and in no acute distress. Eyes: Conjunctivae are normal. PERRL. EOMI. Head: Atraumatic. Nose: No congestion/rhinnorhea. Mouth/Throat: Mucous membranes are moist.  Oropharynx non-erythematous. Neck: No stridor.   Cardiovascular: Normal rate, regular rhythm. Grossly normal heart sounds.  Good peripheral circulation. Respiratory: Normal respiratory effort.  No retractions. Lungs CTAB. Gastrointestinal: Soft and moderately tender to palpation left lower quadrant with guarding, no rebound. No distention. No abdominal bruits. No CVA tenderness. Musculoskeletal: No lower extremity tenderness nor edema.  No joint  effusions. Neurologic:  Normal speech and language. No gross focal neurologic deficits are appreciated. No gait instability. Skin:  Skin is warm, dry and intact. No rash noted. Psychiatric: Mood and affect are normal. Speech and behavior are normal.  ____________________________________________   LABS (all labs ordered are listed, but only abnormal results are displayed)  Labs Reviewed  COMPREHENSIVE METABOLIC PANEL - Abnormal; Notable for the following:    Calcium 8.8 (*)    All other components within normal limits  URINALYSIS COMPLETEWITH MICROSCOPIC ([facility] ONLY) - Abnormal; Notable for the following:    Color, Urine YELLOW (*)    APPearance CLEAR (*)    All other components within normal limits  CBC  LIPASE, BLOOD  LACTIC ACID, PLASMA  LACTIC ACID, PLASMA   ____________________________________________  EKG  None ____________________________________________  RADIOLOGY  CT abdomen and pelvis with contrast interpreted per Dr. Clovis Riley: 1. Acute inflammation of the descending colon. This probably represents diverticulitis, but the involved segment is rather lengthy and colitis or ischemia are not entirely excluded. No abscess. No extraluminal air. No obstruction. 2. Extensive colonic diverticulosis. ____________________________________________   PROCEDURES  Procedure(s) performed: None  Critical Care performed: No  ____________________________________________   INITIAL IMPRESSION / ASSESSMENT AND PLAN / ED COURSE  Pertinent labs & imaging results that were available during my care of the patient were reviewed by me and considered in my medical decision making (see chart for details).  35 year old male with a history of diverticulitis presents with left lower quadrant pain. Despite his afebrile status and normal white count, patient is very tender to abdominal exam. Will administer analgesia and proceed with CT abdomen/pelvis to evaluate for  perforation/abscess.  ----------------------------------------- 5:19 AM on 03/22/2016 -----------------------------------------  Updated patient of CT imaging results. Did note interpretation reads "colitis or ischemia are not entirely excluded". Patient's clinical presentation is more consistent with inflammatory/infectious etiology rather than ischemic; however, will add lactate to help differentiate. Patient tells me he has an aunt who has ulcerative colitis. In this young male with a fifth episode of diverticulitis, along with a family history of UC, it is probable that he may have underlying undiagnosed ulcerative colitis. He will need a GI referral.  ----------------------------------------- 7:04 AM on 03/22/2016 -----------------------------------------  Noted lactate is normal. Patient resting in no acute distress. IV antibiotic infusing. Anticipate discharge home after completion of antibiotics. Will be discharged with prescriptions for antibiotics, analgesia and antiemetic. Strict return precautions given. Patient verbalizes understanding and agrees with plan of care. ____________________________________________   FINAL CLINICAL IMPRESSION(S) / ED DIAGNOSES  Final diagnoses:  Left lower quadrant pain  Diverticulitis of large intestine without perforation or abscess without bleeding      NEW MEDICATIONS STARTED DURING THIS VISIT:  New Prescriptions   CIPROFLOXACIN (CIPRO) 500 MG TABLET  Take 1 tablet (500 mg total) by mouth 2 (two) times daily.   METRONIDAZOLE (FLAGYL) 500 MG TABLET    Take 1 tablet (500 mg total) by mouth 2 (two) times daily.   ONDANSETRON (ZOFRAN ODT) 4 MG DISINTEGRATING TABLET    Take 1 tablet (4 mg total) by mouth every 8 (eight) hours as needed for nausea or vomiting.   OXYCODONE-ACETAMINOPHEN (ROXICET) 5-325 MG TABLET    Take 1 tablet by mouth every 4 (four) hours as needed for severe pain.     Note:  This document was prepared using Dragon voice  recognition software and may include unintentional dictation errors.    Irean HongJade J Sung, MD 03/22/16 [phone]

## 2016-03-22 NOTE — ED Notes (Signed)
Pt resting in bed, watching tv, pt awake and alert in no acute distress

## 2016-03-22 NOTE — Discharge Instructions (Signed)
1. Take antibiotics as prescribed: Cipro 500 mg twice daily 7 days Flagyl 500 mg twice daily 7 days 2. You may take pain and nausea medicines as needed (Percocet/Zofran #20). 3. Please follow-up with the GI specialist for further evaluation of possible ulcerative colitis. 4. Return to the ER for worsening symptoms, persistent vomiting, fever or other concerns.  Abdominal Pain, Adult Many things can cause abdominal pain. Usually, abdominal pain is not caused by a disease and will improve without treatment. It can often be observed and treated at home. Your health care provider will do a physical exam and possibly order blood tests and X-rays to help determine the seriousness of your pain. However, in many cases, more time must pass before a clear cause of the pain can be found. Before that point, your health care provider may not know if you need more testing or further treatment. HOME CARE INSTRUCTIONS Monitor your abdominal pain for any changes. The following actions may help to alleviate any discomfort you are experiencing:  Only take over-the-counter or prescription medicines as directed by your health care provider.  Do not take laxatives unless directed to do so by your health care provider.  Try a clear liquid diet (broth, tea, or water) as directed by your health care provider. Slowly move to a bland diet as tolerated. SEEK MEDICAL CARE IF:  You have unexplained abdominal pain.  You have abdominal pain associated with nausea or diarrhea.  You have pain when you urinate or have a bowel movement.  You experience abdominal pain that wakes you in the night.  You have abdominal pain that is worsened or improved by eating food.  You have abdominal pain that is worsened with eating fatty foods.  You have a fever. SEEK IMMEDIATE MEDICAL CARE IF:  Your pain does not go away within 2 hours.  You keep throwing up (vomiting).  Your pain is felt only in portions of the abdomen, such  as the right side or the left lower portion of the abdomen.  You pass bloody or black tarry stools. MAKE SURE YOU:  Understand these instructions.  Will watch your condition.  Will get help right away if you are not doing well or get worse.   This information is not intended to replace advice given to you by your health care provider. Make sure you discuss any questions you have with your health care provider.   Document Released: 06/21/2005 Document Revised: 06/02/2015 Document Reviewed: 05/21/2013 Elsevier Interactive Patient Education 2016 [employer]  Diverticulitis Diverticulitis is inflammation or infection of small pouches in your colon that form when you have a condition called diverticulosis. The pouches in your colon are called diverticula. Your colon, or large intestine, is where water is absorbed and stool is formed. Complications of diverticulitis can include:  Bleeding.  Severe infection.  Severe pain.  Perforation of your colon.  Obstruction of your colon. CAUSES  Diverticulitis is caused by bacteria. Diverticulitis happens when stool becomes trapped in diverticula. This allows bacteria to grow in the diverticula, which can lead to inflammation and infection. RISK FACTORS People with diverticulosis are at risk for diverticulitis. Eating a diet that does not include enough fiber from fruits and vegetables may make diverticulitis more likely to develop. SYMPTOMS  Symptoms of diverticulitis may include:  Abdominal pain and tenderness. The pain is normally located on the left side of the abdomen, but may occur in other areas.  Fever and chills.  Bloating.  Cramping.  Nausea.  Vomiting.  Constipation.  Diarrhea.  Blood in your stool. DIAGNOSIS  Your health care provider will ask you about your medical history and do a physical exam. You may need to have tests done because many medical conditions can cause the same symptoms as diverticulitis. Tests  may include:  Blood tests.  Urine tests.  Imaging tests of the abdomen, including X-rays and CT scans. When your condition is under control, your health care provider may recommend that you have a colonoscopy. A colonoscopy can show how severe your diverticula are and whether something else is causing your symptoms. TREATMENT  Most cases of diverticulitis are mild and can be treated at home. Treatment may include:  Taking over-the-counter pain medicines.  Following a clear liquid diet.  Taking antibiotic medicines by mouth for 7-10 days. More severe cases may be treated at a hospital. Treatment may include:  Not eating or drinking.  Taking prescription pain medicine.  Receiving antibiotic medicines through an IV tube.  Receiving fluids and nutrition through an IV tube.  Surgery. HOME CARE INSTRUCTIONS   Follow your health care provider's instructions carefully.  Follow a full liquid diet or other diet as directed by your health care provider. After your symptoms improve, your health care provider may tell you to change your diet. He or she may recommend you eat a high-fiber diet. Fruits and vegetables are good sources of fiber. Fiber makes it easier to pass stool.  Take fiber supplements or probiotics as directed by your health care provider.  Only take medicines as directed by your health care provider.  Keep all your follow-up appointments. SEEK MEDICAL CARE IF:   Your pain does not improve.  You have a hard time eating food.  Your bowel movements do not return to normal. SEEK IMMEDIATE MEDICAL CARE IF:   Your pain becomes worse.  Your symptoms do not get better.  Your symptoms suddenly get worse.  You have a fever.  You have repeated vomiting.  You have bloody or black, tarry stools. MAKE SURE YOU:   Understand these instructions.  Will watch your condition.  Will get help right away if you are not doing well or get worse.   This information is not  intended to replace advice given to you by your health care provider. Make sure you discuss any questions you have with your health care provider.   Document Released: 06/21/2005 Document Revised: 09/16/2013 Document Reviewed: 08/06/2013 Elsevier Interactive Patient Education [employer] Elsevier Inc.

## 2016-05-07 ENCOUNTER — Emergency Department
Admission: EM | Admit: 2016-05-07 | Discharge: 2016-05-07 | Disposition: A | Discharge status: Home / Self Care | Payer: Self-pay | Attending: Emergency Medicine | Admitting: Emergency Medicine

## 2016-05-07 DIAGNOSIS — Z79899 Other long term (current) drug therapy: Secondary | ICD-10-CM | POA: Insufficient documentation

## 2016-05-07 DIAGNOSIS — K5792 Diverticulitis of intestine, part unspecified, without perforation or abscess without bleeding: Secondary | ICD-10-CM | POA: Insufficient documentation

## 2016-05-07 DIAGNOSIS — F17200 Nicotine dependence, unspecified, uncomplicated: Secondary | ICD-10-CM | POA: Insufficient documentation

## 2016-05-07 LAB — CBC WITH DIFFERENTIAL/PLATELET
BASOS ABS: 0.1 10*3/uL (ref 0–0.1)
BASOS PCT: 1 %
EOS PCT: 1 %
Eosinophils Absolute: 0.1 10*3/uL (ref 0–0.7)
HCT: 44.1 % (ref 40.0–52.0)
Hemoglobin: 15.2 g/dL (ref 13.0–18.0)
LYMPHS PCT: 28 %
Lymphs Abs: 1.7 10*3/uL (ref 1.0–3.6)
MCH: 29.2 pg (ref 26.0–34.0)
MCHC: 34.4 g/dL (ref 32.0–36.0)
MCV: 84.9 fL (ref 80.0–100.0)
MONO ABS: 0.6 10*3/uL (ref 0.2–1.0)
Monocytes Relative: 10 %
NEUTROS ABS: 3.7 10*3/uL (ref 1.4–6.5)
Neutrophils Relative %: 60 %
PLATELETS: 207 10*3/uL (ref 150–440)
RBC: 5.19 MIL/uL (ref 4.40–5.90)
RDW: 13.2 % (ref 11.5–14.5)
WBC: 6.2 10*3/uL (ref 3.8–10.6)

## 2016-05-07 LAB — COMPREHENSIVE METABOLIC PANEL
ALK PHOS: 91 U/L (ref 38–126)
ALT: 39 U/L (ref 17–63)
ANION GAP: 8 (ref 5–15)
AST: 27 U/L (ref 15–41)
Albumin: 4.1 g/dL (ref 3.5–5.0)
BILIRUBIN TOTAL: 1 mg/dL (ref 0.3–1.2)
BUN: 15 mg/dL (ref 6–20)
CALCIUM: 9 mg/dL (ref 8.9–10.3)
CO2: 25 mmol/L (ref 22–32)
Chloride: 106 mmol/L (ref 101–111)
Creatinine, Ser: 1.05 mg/dL (ref 0.61–1.24)
GFR calc Af Amer: >60 mL/min (ref >60)
Glucose, Bld: 92 mg/dL (ref 65–99)
POTASSIUM: 3.7 mmol/L (ref 3.5–5.1)
Sodium: 139 mmol/L (ref 135–145)
TOTAL PROTEIN: 8.1 g/dL (ref 6.5–8.1)

## 2016-05-07 LAB — URINALYSIS COMPLETE WITH MICROSCOPIC (ARMC ONLY)
BACTERIA UA: NONE SEEN
Bilirubin Urine: NEGATIVE
GLUCOSE, UA: NEGATIVE mg/dL
HGB URINE DIPSTICK: NEGATIVE
LEUKOCYTES UA: NEGATIVE
NITRITE: NEGATIVE
PH: 6 (ref 5.0–8.0)
Protein, ur: NEGATIVE mg/dL
SPECIFIC GRAVITY, URINE: 1.017 (ref 1.005–1.030)
Squamous Epithelial / LPF: NONE SEEN

## 2016-05-07 LAB — LIPASE, BLOOD: Lipase: 24 U/L (ref 11–51)

## 2016-05-07 LAB — LACTIC ACID, PLASMA: Lactic Acid, Venous: 0.7 mmol/L (ref 0.5–1.9)

## 2016-05-07 MED ORDER — ONDANSETRON HCL 4 MG/2ML IJ SOLN
4.0000 mg | Freq: Once | INTRAMUSCULAR | Status: AC
  Administered 2016-05-07: 4 mg via INTRAVENOUS
  Filled 2016-05-07: qty 2

## 2016-05-07 MED ORDER — CIPROFLOXACIN HCL 500 MG PO TABS
500.0000 mg | ORAL_TABLET | Freq: Once | ORAL | Status: AC
  Administered 2016-05-07: 500 mg via ORAL
  Filled 2016-05-07: qty 1

## 2016-05-07 MED ORDER — OXYCODONE-ACETAMINOPHEN 5-325 MG PO TABS
2.0000 | ORAL_TABLET | Freq: Once | ORAL | Status: AC
  Administered 2016-05-07: 2 via ORAL
  Filled 2016-05-07: qty 2

## 2016-05-07 MED ORDER — HYDROMORPHONE HCL 1 MG/ML IJ SOLN
1.0000 mg | Freq: Once | INTRAMUSCULAR | Status: AC
  Administered 2016-05-07: 1 mg via INTRAVENOUS
  Filled 2016-05-07: qty 1

## 2016-05-07 MED ORDER — ONDANSETRON HCL 4 MG PO TABS: 4.0000 mg | ORAL_TABLET | Freq: Three times a day (TID) | ORAL | 0 refills | Status: DC | PRN

## 2016-05-07 MED ORDER — METRONIDAZOLE 500 MG PO TABS: 500.0000 mg | ORAL_TABLET | Freq: Two times a day (BID) | ORAL | 0 refills | Status: DC

## 2016-05-07 MED ORDER — HYDROMORPHONE HCL 1 MG/ML IJ SOLN
0.5000 mg | Freq: Once | INTRAMUSCULAR | Status: AC
Start: 2016-05-07 — End: 2016-05-07
  Administered 2016-05-07: 0.5 mg via INTRAVENOUS
  Filled 2016-05-07: qty 1

## 2016-05-07 MED ORDER — SODIUM CHLORIDE 0.9 % IV BOLUS (SEPSIS)
1000.0000 mL | Freq: Once | INTRAVENOUS | Status: AC
  Administered 2016-05-07: 1000 mL via INTRAVENOUS

## 2016-05-07 MED ORDER — OXYCODONE-ACETAMINOPHEN 5-325 MG PO TABS: 1.0000 | ORAL_TABLET | ORAL | 0 refills | Status: DC | PRN

## 2016-05-07 MED ORDER — METRONIDAZOLE 500 MG PO TABS
500.0000 mg | ORAL_TABLET | Freq: Once | ORAL | Status: AC
  Administered 2016-05-07: 500 mg via ORAL
  Filled 2016-05-07: qty 1

## 2016-05-07 MED ORDER — CIPROFLOXACIN HCL 500 MG PO TABS: 500.0000 mg | ORAL_TABLET | Freq: Two times a day (BID) | ORAL | 0 refills | Status: DC

## 2016-05-07 NOTE — ED Notes (Signed)
Pt aware of need for urine specimen. 

## 2016-05-07 NOTE — Discharge Instructions (Signed)
You're being treated for presumed diverticulitis episode with antibiotic Cipro and Flagyl and nausea medicine Zofran, and pain medicine Roxicet.  Return to the emergency department for any worsening symptoms including fever, worsening abdominal pain, black or bloody stools, vomiting, dizziness or passing out, or any other symptoms concerning to you.  As we discussed, you need to see a gastrologist, please call the office to try to make an appointment. I also think he needs close follow-up with the primary care physician, either you have seen in the past, or the [facility].

## 2016-05-07 NOTE — ED Triage Notes (Signed)
Patient reports abdominal pain.  Patient reports history of diverticulitis.

## 2016-05-07 NOTE — ED Provider Notes (Signed)
[facility] Emergency Department Provider Note ____________________________________________   I have reviewed the triage vital signs and the triage nursing note.  HISTORY  Chief Complaint Abdominal Pain   Historian Patient  HPI Matthew Chaney is a 35 y.o. male with multiple episodes of prior diverticulitis, including 2 episodes this year already confirmed by CT scan here in the emergency department and treated with Cipro and Flagyl. Patient states that his aunt has ulcerative colitis, and that he was told that he should follow-up with a gastroenterologist, and has not followed up there yet. Denies fever. Denies black or bloody stools. Denies nausea or emesis. Pain is located in the left lower quadrant and periumbilical area. This pain has been ongoing for a day or so now and is not going away. It feels similar to prior episodes when he was diagnosed with diverticulitis.  Nothing makes it worse or better. Symptoms are moderate.    Past Medical History:  Diagnosis Date  . Diverticulitis   . GERD (gastroesophageal reflux disease)     There are no active problems to display for this patient.   Past Surgical History:  Procedure Laterality Date  . NOSE SURGERY      Prior to Admission medications   Medication Sig Start Date End Date Taking? Authorizing Provider  esomeprazole (NEXIUM) 20 MG capsule Take 20 mg by mouth every morning.    Yes Historical Provider, MD  Psyllium (METAMUCIL FIBER PO) Take by mouth See admin instructions. Mix 2 tablespoonfuls of powder in 8 oz of water and drink by mouth 3 times a day.   Yes Historical Provider, MD  ciprofloxacin (CIPRO) 500 MG tablet Take 1 tablet (500 mg total) by mouth 2 (two) times daily. 05/07/16 05/21/16  Governor Rooksebecca Niyati Heinke, MD  metroNIDAZOLE (FLAGYL) 500 MG tablet Take 1 tablet (500 mg total) by mouth 2 (two) times daily. 05/07/16   Governor Rooksebecca Alyscia Carmon, MD  ondansetron (ZOFRAN ODT) 4 MG disintegrating tablet Take 1 tablet (4 mg  total) by mouth every 8 (eight) hours as needed for nausea or vomiting. Patient not taking: Reported on 05/07/2016 03/22/16   Irean HongJade J Sung, MD  ondansetron (ZOFRAN) 4 MG tablet Take 1 tablet (4 mg total) by mouth every 8 (eight) hours as needed for nausea or vomiting. 05/07/16   Governor Rooksebecca Stephinie Battisti, MD  oxyCODONE-acetaminophen (ROXICET) 5-325 MG tablet Take 1-2 tablets by mouth every 4 (four) hours as needed for severe pain. 05/07/16   Governor Rooksebecca Christoher Drudge, MD    Allergies  Allergen Reactions  . Vicodin [Hydrocodone-Acetaminophen] Hives and Rash    No family history on file.  Social History Social History  Substance Use Topics  . Smoking status: Current Some Day Smoker  . Smokeless tobacco: Not on file  . Alcohol use Yes    Review of Systems  Constitutional: Negative for fever. Eyes: Negative for visual changes. ENT: Negative for sore throat. Cardiovascular: Negative for chest pain. Respiratory: Negative for shortness of breath. Gastrointestinal: As per history of present illness. No constipation or diarrhea.. Genitourinary: Negative for dysuria. Musculoskeletal: Negative for back pain. Skin: Negative for rash. Neurological: Negative for headache. 10 point Review of Systems otherwise negative ____________________________________________   PHYSICAL EXAM:  VITAL SIGNS: ED Triage Vitals  Enc Vitals Group     BP 05/07/16 0657 120/83     Pulse Rate 05/07/16 0657 90     Resp 05/07/16 0657 20     Temp 05/07/16 0657 97.9 F (36.6 C)     Temp Source 05/07/16 0657 Oral  SpO2 05/07/16 0657 99 %     Weight 05/07/16 0655 195 lb (88.5 kg)     Height 05/07/16 0655  (1.753 m)     Head Circumference --      Peak Flow --      Pain Score 05/07/16 0658 10     Pain Loc --      Pain Edu? --      Excl. in GC? --      Constitutional: Alert and oriented. Well appearing and in no distress. HEENT   Head: Normocephalic and atraumatic.      Eyes: Conjunctivae are normal. PERRL. Normal  extraocular movements.      Ears:         Nose: No congestion/rhinnorhea.   Mouth/Throat: Mucous membranes are moist.   Neck: No stridor. Cardiovascular/Chest: Normal rate, regular rhythm.  No murmurs, rubs, or gallops. Respiratory: Normal respiratory effort without tachypnea nor retractions. Breath sounds are clear and equal bilaterally. No wheezes/rales/rhonchi. Gastrointestinal: Soft. No distention, no guarding, no rebound. Moderate tenderness left side of the umbilicus and left lower quadrant.  Genitourinary/rectal:Deferred Musculoskeletal: Nontender with normal range of motion in all extremities. No joint effusions.  No lower extremity tenderness.  No edema. Neurologic:  Normal speech and language. No gross or focal neurologic deficits are appreciated. Skin:  Skin is warm, dry and intact. No rash noted. Psychiatric: Mood and affect are normal. Speech and behavior are normal. Patient exhibits appropriate insight and judgment.  ____________________________________________   EKG I, Governor Rooks, MD, the attending physician have personally viewed and interpreted all ECGs.  None ____________________________________________  LABS (pertinent positives/negatives)  Labs Reviewed  URINALYSIS COMPLETEWITH MICROSCOPIC ([facility] ONLY) - Abnormal; Notable for the following:       Result Value   Color, Urine YELLOW (*)    APPearance CLEAR (*)    Ketones, ur TRACE (*)    All other components within normal limits  COMPREHENSIVE METABOLIC PANEL  CBC WITH DIFFERENTIAL/PLATELET  LIPASE, BLOOD  LACTIC ACID, PLASMA  LACTIC ACID, PLASMA    ____________________________________________  RADIOLOGY All Xrays were viewed by me. Imaging interpreted by Radiologist.  None __________________________________________  PROCEDURES  Procedure(s) performed: None  Critical Care performed: None  ____________________________________________   ED COURSE / ASSESSMENT AND PLAN  Pertinent labs &  imaging results that were available during my care of the patient were reviewed by me and considered in my medical decision making (see chart for details).   This patient states that he has had several diagnosis of diverticulitis, and indeed when I look at his prior chart, there are 2 CTs from this year showing left-sided diverticulitis, and at those presentations he felt similar to this and had negative/normal laboratory studies including a normal white blood count.  Today he also has normal laboratory studies including a normal white blood cell count. I discussed with him that given this episode feels similar to prior episodes where he was found have diverticulitis/colitis, I would prefer not to CT scan him because he's had already to this year. I'm going to treat presumptively for diverticulitis/colitis with Flagyl and Cipro and send him with symptomatic medications Percocet and Zofran. I again have heavily impressed on him that he needs to see a gastroenterologist, because of recurrent symptoms, because a family history of ulcerative colitis, and my concern that he may actually have an underlying inflammatory bowel disease.  At one point he did state that he had some pain that was sharp down into his rectum. I offered  a rectal exam, but he chose to decline. I again have told him that he definitely needs to see a gastroenterologist and have a rectal exam/anoscopy and likely colonoscopy.  10:40 am, still some pain, but we discussed observation and treatment for acute uncontrolled pain versus outpatient with medication and patient would like to go home.   CONSULTATIONS:   none   Patient / Family / Caregiver informed of clinical course, medical decision-making process, and agree with plan.   I discussed return precautions, follow-up instructions, and discharged instructions with patient and/or family.   ___________________________________________   FINAL CLINICAL IMPRESSION(S) / ED  DIAGNOSES   Final diagnoses:  Acute diverticulitis              Note: This dictation was prepared with Dragon dictation. Any transcriptional errors that result from this process are unintentional    Governor Rooks, MD 05/07/16 1039

## 2016-05-09 DIAGNOSIS — K5732 Diverticulitis of large intestine without perforation or abscess without bleeding: Principal | ICD-10-CM | POA: Diagnosis present

## 2016-05-09 DIAGNOSIS — F17200 Nicotine dependence, unspecified, uncomplicated: Secondary | ICD-10-CM | POA: Diagnosis present

## 2016-05-09 DIAGNOSIS — K219 Gastro-esophageal reflux disease without esophagitis: Secondary | ICD-10-CM | POA: Diagnosis present

## 2016-05-09 DIAGNOSIS — Z885 Allergy status to narcotic agent status: Secondary | ICD-10-CM

## 2016-05-10 ENCOUNTER — Emergency Department: Payer: Self-pay

## 2016-05-10 ENCOUNTER — Inpatient Hospital Stay
Admission: EM | Admit: 2016-05-10 | Discharge: 2016-05-14 | DRG: 392 | Disposition: A | Discharge status: Home / Self Care | Payer: Self-pay | Attending: Internal Medicine | Admitting: Internal Medicine

## 2016-05-10 DIAGNOSIS — K5792 Diverticulitis of intestine, part unspecified, without perforation or abscess without bleeding: Secondary | ICD-10-CM | POA: Diagnosis present

## 2016-05-10 LAB — URINALYSIS COMPLETE WITH MICROSCOPIC (ARMC ONLY)
BACTERIA UA: NONE SEEN
BILIRUBIN URINE: NEGATIVE
GLUCOSE, UA: NEGATIVE mg/dL
HGB URINE DIPSTICK: NEGATIVE
Ketones, ur: NEGATIVE mg/dL
NITRITE: NEGATIVE
PH: 5 (ref 5.0–8.0)
Protein, ur: NEGATIVE mg/dL
RBC / HPF: NONE SEEN RBC/hpf (ref 0–5)
SPECIFIC GRAVITY, URINE: 1.013 (ref 1.005–1.030)

## 2016-05-10 LAB — COMPREHENSIVE METABOLIC PANEL
ALBUMIN: 4.3 g/dL (ref 3.5–5.0)
ALT: 25 U/L (ref 17–63)
AST: 22 U/L (ref 15–41)
Alkaline Phosphatase: 99 U/L (ref 38–126)
Anion gap: 8 (ref 5–15)
BUN: 12 mg/dL (ref 6–20)
CHLORIDE: 104 mmol/L (ref 101–111)
CO2: 25 mmol/L (ref 22–32)
CREATININE: 1.11 mg/dL (ref 0.61–1.24)
Calcium: 9.1 mg/dL (ref 8.9–10.3)
GFR calc Af Amer: >60 mL/min (ref >60)
GLUCOSE: 119 mg/dL — AB (ref 65–99)
Potassium: 3.5 mmol/L (ref 3.5–5.1)
Sodium: 137 mmol/L (ref 135–145)
Total Bilirubin: 0.6 mg/dL (ref 0.3–1.2)
Total Protein: 8.3 g/dL — ABNORMAL HIGH (ref 6.5–8.1)

## 2016-05-10 LAB — CBC
HEMATOCRIT: 41.7 % (ref 40.0–52.0)
HEMOGLOBIN: 14.2 g/dL (ref 13.0–18.0)
MCH: 28.6 pg (ref 26.0–34.0)
MCHC: 34.1 g/dL (ref 32.0–36.0)
MCV: 83.8 fL (ref 80.0–100.0)
Platelets: 248 10*3/uL (ref 150–440)
RBC: 4.97 MIL/uL (ref 4.40–5.90)
RDW: 13.2 % (ref 11.5–14.5)
WBC: 8.5 10*3/uL (ref 3.8–10.6)

## 2016-05-10 LAB — LIPASE, BLOOD: LIPASE: 20 U/L (ref 11–51)

## 2016-05-10 MED ORDER — PIPERACILLIN-TAZOBACTAM 3.375 G IVPB
INTRAVENOUS | Status: AC
  Filled 2016-05-10: qty 50

## 2016-05-10 MED ORDER — OXYCODONE-ACETAMINOPHEN 5-325 MG PO TABS
1.0000 | ORAL_TABLET | ORAL | Status: DC | PRN
  Administered 2016-05-10 – 2016-05-14 (×16): 2 via ORAL
  Filled 2016-05-10 (×5): qty 2
  Filled 2016-05-10: qty 1
  Filled 2016-05-10 (×8): qty 2
  Filled 2016-05-10: qty 1
  Filled 2016-05-10 (×2): qty 2

## 2016-05-10 MED ORDER — HYDROMORPHONE HCL 1 MG/ML IJ SOLN
INTRAMUSCULAR | Status: AC
  Administered 2016-05-10: 0.5 mg via INTRAVENOUS
  Filled 2016-05-10: qty 1

## 2016-05-10 MED ORDER — ONDANSETRON HCL 4 MG/2ML IJ SOLN
INTRAMUSCULAR | Status: AC
  Administered 2016-05-10: 4 mg via INTRAVENOUS
  Filled 2016-05-10: qty 2

## 2016-05-10 MED ORDER — ACETAMINOPHEN 325 MG PO TABS: 650.0000 mg | ORAL_TABLET | Freq: Four times a day (QID) | ORAL | Status: DC | PRN

## 2016-05-10 MED ORDER — OXYCODONE-ACETAMINOPHEN 5-325 MG PO TABS: 1.0000 | ORAL_TABLET | ORAL | Status: DC | PRN

## 2016-05-10 MED ORDER — PIPERACILLIN-TAZOBACTAM 3.375 G IVPB
3.3750 g | Freq: Three times a day (TID) | INTRAVENOUS | Status: DC
  Administered 2016-05-10 – 2016-05-12 (×6): 3.375 g via INTRAVENOUS
  Filled 2016-05-10 (×8): qty 50

## 2016-05-10 MED ORDER — SODIUM CHLORIDE 0.9 % IV SOLN
INTRAVENOUS | Status: DC
  Administered 2016-05-10 – 2016-05-12 (×4): via INTRAVENOUS

## 2016-05-10 MED ORDER — SODIUM CHLORIDE 0.9 % IV BOLUS (SEPSIS)
1000.0000 mL | Freq: Once | INTRAVENOUS | Status: AC
  Administered 2016-05-10: 1000 mL via INTRAVENOUS

## 2016-05-10 MED ORDER — ENOXAPARIN SODIUM 40 MG/0.4ML ~~LOC~~ SOLN
40.0000 mg | SUBCUTANEOUS | Status: DC
  Administered 2016-05-10 – 2016-05-12 (×3): 40 mg via SUBCUTANEOUS
  Filled 2016-05-10 (×4): qty 0.4

## 2016-05-10 MED ORDER — IOPAMIDOL (ISOVUE-300) INJECTION 61%
100.0000 mL | Freq: Once | INTRAVENOUS | Status: AC | PRN
  Administered 2016-05-10: 100 mL via INTRAVENOUS

## 2016-05-10 MED ORDER — HYDROMORPHONE HCL 1 MG/ML IJ SOLN
0.5000 mg | Freq: Once | INTRAMUSCULAR | Status: AC
  Administered 2016-05-10: 0.5 mg via INTRAVENOUS

## 2016-05-10 MED ORDER — ACETAMINOPHEN 650 MG RE SUPP: 650.0000 mg | Freq: Four times a day (QID) | RECTAL | Status: DC | PRN

## 2016-05-10 MED ORDER — PSYLLIUM 95 % PO PACK
PACK | Freq: Three times a day (TID) | ORAL | Status: DC
Start: 2016-05-10 — End: 2016-05-14
  Administered 2016-05-10 – 2016-05-11 (×5): 1 via ORAL
  Administered 2016-05-11: 22:00:00 via ORAL
  Administered 2016-05-12 – 2016-05-14 (×4): 1 via ORAL
  Filled 2016-05-10 (×11): qty 1

## 2016-05-10 MED ORDER — HYDROMORPHONE HCL 1 MG/ML IJ SOLN
1.0000 mg | INTRAMUSCULAR | Status: DC | PRN
  Administered 2016-05-10 – 2016-05-13 (×18): 1 mg via INTRAVENOUS
  Filled 2016-05-10 (×18): qty 1

## 2016-05-10 MED ORDER — HYDROMORPHONE HCL 1 MG/ML IJ SOLN
0.5000 mg | Freq: Once | INTRAMUSCULAR | Status: AC
  Administered 2016-05-10: 0.5 mg via INTRAVENOUS
  Filled 2016-05-10: qty 1

## 2016-05-10 MED ORDER — PANTOPRAZOLE SODIUM 40 MG PO TBEC
40.0000 mg | DELAYED_RELEASE_TABLET | Freq: Every day | ORAL | Status: DC
  Administered 2016-05-10 – 2016-05-14 (×5): 40 mg via ORAL
  Filled 2016-05-10 (×5): qty 1

## 2016-05-10 MED ORDER — ONDANSETRON HCL 4 MG/2ML IJ SOLN
4.0000 mg | Freq: Once | INTRAMUSCULAR | Status: AC
  Administered 2016-05-10: 4 mg via INTRAVENOUS

## 2016-05-10 MED ORDER — ONDANSETRON HCL 4 MG/2ML IJ SOLN
4.0000 mg | Freq: Four times a day (QID) | INTRAMUSCULAR | Status: DC | PRN
Start: 2016-05-10 — End: 2016-05-12
  Administered 2016-05-10 – 2016-05-12 (×5): 4 mg via INTRAVENOUS
  Filled 2016-05-10 (×5): qty 2

## 2016-05-10 MED ORDER — ONDANSETRON HCL 4 MG/2ML IJ SOLN: 4.0000 mg | Freq: Four times a day (QID) | INTRAMUSCULAR | Status: DC

## 2016-05-10 MED ORDER — PIPERACILLIN-TAZOBACTAM 3.375 G IVPB 30 MIN
3.3750 g | Freq: Once | INTRAVENOUS | Status: AC
  Administered 2016-05-10: 3.375 g via INTRAVENOUS

## 2016-05-10 MED ORDER — DIATRIZOATE MEGLUMINE & SODIUM 66-10 % PO SOLN
15.0000 mL | Freq: Once | ORAL | Status: AC
  Administered 2016-05-10: 15 mL via ORAL

## 2016-05-10 NOTE — ED Notes (Signed)
Report to Jill RN to assume care at this time

## 2016-05-10 NOTE — ED Triage Notes (Signed)
Patient ambulatory to triage with steady gait, without difficulty or distress noted; pt reports no BM since Sunday; st hx diverticulitis, seen here recently and rx cipro and flagyl; st mid lower abd pain with no N/V

## 2016-05-10 NOTE — ED Notes (Signed)
[facility]

## 2016-05-10 NOTE — ED Notes (Signed)
Pt presents with ABD pain to LUQand LLQ. Severe stabbing. Jumped on palpation. Pt reports no BM since Sunday. Pt st hx diverticulitis, Pt seen here recently and rx cipro and flagyl. Pt denies vomitng. Pt states nausea and burning in mid chest. Pt states he epiodes of dizziness as well. Will continue to monitor. No distress noted. Breathing even and unlabored.

## 2016-05-10 NOTE — ED Notes (Signed)
MD at bedside. 

## 2016-05-10 NOTE — H&P (Addendum)
[facility] [place] at [facility]   PATIENT NAME: Matthew LodgeJoshua Gang    MR#:  [id number]  DATE OF BIRTH:  [birth year]  DATE OF ADMISSION:  05/10/2016  PRIMARY CARE PHYSICIAN: Lucia EstelleKAYSIN, ALEXANDER, MD   REQUESTING/REFERRING PHYSICIAN: Dr Derrill KayGoodman  CHIEF COMPLAINT:  Ongoing left lower quadrant abdominal pain recurrent.  HISTORY OF PRESENT ILLNESS:  Matthew Chaney  is a 35 y.o. male with a known history of Diverticulitis in the past along with history of GERD comes to the emergency room within a week with complaints of left lower quadrant abdominal pain sharp minutes and aggravation. Having bowel movement. Denies any bloody stools. He was recently given oral antibiotics as outpatient with Cipro Flagyl 2 help with his acute colitis. Patient continued with significant pain CT abdomen showed new sigmoid colon diverticulitis he is being admitted for further nausea management. This is patient's fourth round of diverticulitis since March 2017.  PAST MEDICAL HISTORY:   Past Medical History:  Diagnosis Date  . Diverticulitis   . GERD (gastroesophageal reflux disease)     PAST SURGICAL HISTOIRY:   Past Surgical History:  Procedure Laterality Date  . NOSE SURGERY      SOCIAL HISTORY:   Social History  Substance Use Topics  . Smoking status: Current Some Day Smoker  . Smokeless tobacco: Not on file  . Alcohol use Yes    FAMILY HISTORY:  No family history on file.  DRUG ALLERGIES:   Allergies  Allergen Reactions  . Vicodin [Hydrocodone-Acetaminophen] Hives and Rash    REVIEW OF SYSTEMS:  Review of Systems  Constitutional: Negative for chills, fever and weight loss.  HENT: Negative for ear discharge, ear pain and nosebleeds.   Eyes: Negative for blurred vision, pain and discharge.  Respiratory: Negative for sputum production, shortness of breath, wheezing and stridor.   Cardiovascular: Negative for chest pain, palpitations, orthopnea and PND.   Gastrointestinal: Positive for abdominal pain and nausea. Negative for diarrhea and vomiting.  Genitourinary: Negative for frequency and urgency.  Musculoskeletal: Negative for back pain and joint pain.  Neurological: Negative for sensory change, speech change, focal weakness and weakness.  Psychiatric/Behavioral: Negative for depression and hallucinations. The patient is not nervous/anxious.   All other systems reviewed and are negative.    MEDICATIONS AT HOME:   Prior to Admission medications   Medication Sig Start Date End Date Taking? Authorizing Provider  ciprofloxacin (CIPRO) 500 MG tablet Take 1 tablet (500 mg total) by mouth 2 (two) times daily. 05/07/16 05/21/16 Yes Governor Rooksebecca Lord, MD  esomeprazole (NEXIUM) 20 MG capsule Take 20 mg by mouth every morning.    Yes Historical Provider, MD  metroNIDAZOLE (FLAGYL) 500 MG tablet Take 1 tablet (500 mg total) by mouth 2 (two) times daily. 05/07/16  Yes Governor Rooksebecca Lord, MD  ondansetron (ZOFRAN) 4 MG tablet Take 1 tablet (4 mg total) by mouth every 8 (eight) hours as needed for nausea or vomiting. 05/07/16  Yes Governor Rooksebecca Lord, MD  oxyCODONE-acetaminophen (ROXICET) 5-325 MG tablet Take 1-2 tablets by mouth every 4 (four) hours as needed for severe pain. 05/07/16  Yes Governor Rooksebecca Lord, MD  Psyllium (METAMUCIL FIBER PO) Take by mouth See admin instructions. Mix 2 tablespoonfuls of powder in 8 oz of water and drink by mouth 3 times a day.   Yes Historical Provider, MD      VITAL SIGNS:  Blood pressure 119/79, pulse 88, temperature 98.4 F (36.9 C), resp. rate 20, height 5\' 9"  (1.753 m), weight 190 lb 4.8  oz (86.3 kg), SpO2 97 %.  PHYSICAL EXAMINATION:  GENERAL:  35 y.o.-year-old patient lying in the bed with no acute distress.  EYES: Pupils equal, round, reactive to light and accommodation. No scleral icterus. Extraocular muscles intact.  HEENT: Head atraumatic, normocephalic. Oropharynx and nasopharynx clear.  NECK:  Supple, no jugular venous distention.  No thyroid enlargement, no tenderness.  LUNGS: Normal breath sounds bilaterally, no wheezing, rales,rhonchi or crepitation. No use of accessory muscles of respiration.  CARDIOVASCULAR: S1, S2 normal. No murmurs, rubs, or gallops.  ABDOMEN: Soft, tender left LLQ, nondistended. Bowel sounds present. No organomegaly or mass.  EXTREMITIES: No pedal edema, cyanosis, or clubbing.  NEUROLOGIC: Cranial nerves II through XII are intact. Muscle strength 5/5 in all extremities. Sensation intact. Gait not checked.  PSYCHIATRIC: The patient is alert and oriented x 3.  SKIN: No obvious rash, lesion, or ulcer.   LABORATORY PANEL:   CBC  Recent Labs Lab 05/10/16 0041  WBC 8.5  HGB 14.2  HCT 41.7  PLT 248   ------------------------------------------------------------------------------------------------------------------  Chemistries   Recent Labs Lab 05/10/16 0041  NA 137  K 3.5  CL 104  CO2 25  GLUCOSE 119*  BUN 12  CREATININE 1.11  CALCIUM 9.1  AST 22  ALT 25  ALKPHOS 99  BILITOT 0.6   ------------------------------------------------------------------------------------------------------------------  Cardiac Enzymes No results for input(s): TROPONINI in the last 168 hours. ------------------------------------------------------------------------------------------------------------------  RADIOLOGY:  Dg Abdomen 1 View  Result Date: 05/10/2016 CLINICAL DATA:  Acute onset of lower abdominal pain. Initial encounter. EXAM: ABDOMEN - 1 VIEW COMPARISON:  CT of the abdomen and pelvis from 03/22/2016 FINDINGS: The visualized bowel gas pattern is unremarkable. Scattered air and stool filled loops of colon are seen; no abnormal dilatation of small bowel loops is seen to suggest small bowel obstruction. No free intra-abdominal air is identified, though evaluation for free air is limited on a single supine view. The visualized osseous structures are within normal limits; the sacroiliac joints are  unremarkable in appearance. IMPRESSION: Unremarkable bowel gas pattern; no free intra-abdominal air seen. Moderate amount of stool noted in the colon. Electronically Signed   By: Roanna RaiderJeffery  Chang M.D.   On: 05/10/2016 01:21   Ct Abdomen Pelvis W Contrast  Result Date: 05/10/2016 CLINICAL DATA:  Midline pelvic pain for 5 days. EXAM: CT ABDOMEN AND PELVIS WITH CONTRAST TECHNIQUE: Multidetector CT imaging of the abdomen and pelvis was performed using the standard protocol following bolus administration of intravenous contrast. CONTRAST:  100mL ISOVUE-300 IOPAMIDOL (ISOVUE-300) INJECTION 61% COMPARISON:  03/22/2016 FINDINGS: Lower chest and abdominal wall:  No contributory findings. Hepatobiliary: No focal liver abnormality.No evidence of biliary obstruction or stone. Pancreas: Unremarkable. Spleen: Unremarkable. Adrenals/Urinary Tract: Negative adrenals. No hydronephrosis or stone. Unremarkable bladder. Stomach/Bowel: Inflammation around a focally thickened sigmoid diverticulum with local, new colonic wall thickening. Inflammation is separate from that seen previously in the mid descending colon, where there has been normalization. No abscess or free air. Colonic diverticulosis is extensive, especially for age. Normal appendix. Internal hemorrhoids noted. Reproductive:Negative Vascular/Lymphatic: Negative . Mild enlargement of IMA lymph nodes which are presumably reactive. Other: No ascites or pneumoperitoneum. Musculoskeletal: No acute abnormalities. IMPRESSION: 1. Sigmoid diverticulitis without abscess or free air. Recently seen descending colon diverticulitis is resolved. 2. Extensive colonic diverticulosis for age. Electronically Signed   By: Marnee SpringJonathon  Watts M.D.   On: 05/10/2016 08:18    EKG:    IMPRESSION AND PLAN:  Matthew Chaney  is a 35 y.o. male with a known history of  Diverticulitis in the past along with history of GERD comes to the emergency room within a week with complaints of left lower  quadrant abdominal pain sharp minutes and aggravation. Having bowel movement  1. Recurrent left lower quadrant acute diverticulitis -Fourth episode since March 2017 -CT abdomen shows sigmoid colon diverticulitis. -We'll start patient on IV Zosyn -IV fluids -Clear liquid diet -When necessary pain meds -Surgical consultation given recurrent diverticulitis  2. GERD Continue PPI  3. DVT prophylaxis subcutaneous Lovenox  All the records are reviewed and case discussed with ED provider. Management plans discussed with the patient, family and they are in agreement.  CODE STATUS: full  TOTAL TIME TAKING CARE OF THIS PATIENT: 30 minutes.    Adeel Guiffre M.D on 05/10/2016 at 12:21 PM  Between 7am to 6pm - Pager - [phone]  After 6pm go to [email or web address] - password [email or web address] [facility]  [facility]  Office  [phone]  CC: Primary care physician; Lucia Estelle, MD

## 2016-05-10 NOTE — ED Notes (Signed)
Pt had small amount of stool out and urine output MD aware. Pt tolerated only another 1/4 of enema. Pt states cant take anymore. [facility] after enema. MD made aware.

## 2016-05-10 NOTE — ED Provider Notes (Signed)
[facility] Emergency Department Provider Note    ____________________________________________   I have reviewed the triage vital signs and the nursing notes.   HISTORY  Chief Complaint Abdominal Pain   History limited by: Not Limited   HPI Matthew LodgeJoshua Chaney is a 35 y.o. male with history of diverticulitis who presents to the emergency department today because of continued abdominal pain. He was seen in the emergency department a few days ago and diagnosed with diverticulitis. States he has been taking the Cipro and Flagyl. He is also been taking his pain medication. The pain has gotten worse. He additionally states that he's been having a hard time going to the bathroom. He has not had a good bowel movement since Sunday.   Past Medical History:  Diagnosis Date  . Diverticulitis   . GERD (gastroesophageal reflux disease)     There are no active problems to display for this patient.   Past Surgical History:  Procedure Laterality Date  . NOSE SURGERY      Prior to Admission medications   Medication Sig Start Date End Date Taking? Authorizing Provider  ciprofloxacin (CIPRO) 500 MG tablet Take 1 tablet (500 mg total) by mouth 2 (two) times daily. 05/07/16 05/21/16 Yes Governor Rooksebecca Lord, MD  esomeprazole (NEXIUM) 20 MG capsule Take 20 mg by mouth every morning.    Yes Historical Provider, MD  metroNIDAZOLE (FLAGYL) 500 MG tablet Take 1 tablet (500 mg total) by mouth 2 (two) times daily. 05/07/16  Yes Governor Rooksebecca Lord, MD  ondansetron (ZOFRAN) 4 MG tablet Take 1 tablet (4 mg total) by mouth every 8 (eight) hours as needed for nausea or vomiting. 05/07/16  Yes Governor Rooksebecca Lord, MD  oxyCODONE-acetaminophen (ROXICET) 5-325 MG tablet Take 1-2 tablets by mouth every 4 (four) hours as needed for severe pain. 05/07/16  Yes Governor Rooksebecca Lord, MD  Psyllium (METAMUCIL FIBER PO) Take by mouth See admin instructions. Mix 2 tablespoonfuls of powder in 8 oz of water and drink by mouth 3 times a  day.   Yes Historical Provider, MD    Allergies Vicodin [hydrocodone-acetaminophen]  No family history on file.  Social History Social History  Substance Use Topics  . Smoking status: Current Some Day Smoker  . Smokeless tobacco: Not on file  . Alcohol use Yes    Review of Systems  Constitutional: Negative for fever. Cardiovascular: Negative for chest pain. Respiratory: Negative for shortness of breath. Gastrointestinal: Positive for abdominal pain. Genitourinary: Negative for dysuria. Musculoskeletal: Negative for back pain. Skin: Negative for rash. Neurological: Negative for headaches, focal weakness or numbness.   10-point ROS otherwise negative.  ____________________________________________   PHYSICAL EXAM:  VITAL SIGNS: ED Triage Vitals  Enc Vitals Group     BP 05/10/16 0035 102/74     Pulse Rate 05/10/16 0035 92     Resp 05/10/16 0035 18     Temp 05/10/16 0035 98.1 F (36.7 C)     Temp Source 05/10/16 0035 Oral     SpO2 05/10/16 0035 99 %     Weight 05/10/16 0035 190 lb (86.2 kg)     Height 05/10/16 0035 5\' 9"  (1.753 m)     Head Circumference --      Peak Flow --      Pain Score 05/10/16 0039 10   Constitutional: Alert and oriented. Well appearing and in no distress. Eyes: Conjunctivae are normal. PERRL. Normal extraocular movements. ENT   Head: Normocephalic and atraumatic.   Nose: No congestion/rhinnorhea.   Mouth/Throat: Mucous  membranes are moist.   Neck: No stridor. Hematological/Lymphatic/Immunilogical: No cervical lymphadenopathy. Cardiovascular: Normal rate, regular rhythm.  No murmurs, rubs, or gallops. Respiratory: Normal respiratory effort without tachypnea nor retractions. Breath sounds are clear and equal bilaterally. No wheezes/rales/rhonchi. Gastrointestinal: Soft and tender to palpation somewhat diffusely. No rebound. No guarding. Genitourinary: Deferred Musculoskeletal: Normal range of motion in all extremities. No  joint effusions.  No lower extremity tenderness nor edema. Neurologic:  Normal speech and language. No gross focal neurologic deficits are appreciated.  Skin:  Skin is warm, dry and intact. No rash noted. Psychiatric: Mood and affect are normal. Speech and behavior are normal. Patient exhibits appropriate insight and judgment.  ____________________________________________    LABS (pertinent positives/negatives)  Labs Reviewed  COMPREHENSIVE METABOLIC PANEL - Abnormal; Notable for the following:       Result Value   Glucose, Bld 119 (*)    Total Protein 8.3 (*)    All other components within normal limits  URINALYSIS COMPLETEWITH MICROSCOPIC ([facility] ONLY) - Abnormal; Notable for the following:    Color, Urine YELLOW (*)    APPearance CLEAR (*)    Leukocytes, UA 1+ (*)    Squamous Epithelial / LPF 0-5 (*)    All other components within normal limits  LIPASE, BLOOD  CBC     ____________________________________________   EKG  None  ____________________________________________    RADIOLOGY  CT abd/pel IMPRESSION: 1. Sigmoid diverticulitis without abscess or free air. Recently seen descending colon diverticulitis is resolved. 2. Extensive colonic diverticulosis for age.  ____________________________________________   PROCEDURES  Procedures  ____________________________________________   INITIAL IMPRESSION / ASSESSMENT AND PLAN / ED COURSE  Pertinent labs & imaging results that were available during my care of the patient were reviewed by me and considered in my medical decision making (see chart for details).  History presents to the emergency department today because of continued abdominal pain. CT scan did show diverticulitis without comp case. Given the patient is been on antibiotics without any improvement will admit for IV antibiotics. ____________________________________________   FINAL CLINICAL IMPRESSION(S) / ED DIAGNOSES  Final diagnoses:   Diverticulitis of intestine without perforation or abscess without bleeding     Note: This dictation was prepared with Dragon dictation. Any transcriptional errors that result from this process are unintentional    Phineas SemenGraydon Ernest Popowski, MD 05/10/16 [phone]

## 2016-05-11 DIAGNOSIS — K5792 Diverticulitis of intestine, part unspecified, without perforation or abscess without bleeding: Secondary | ICD-10-CM

## 2016-05-11 MED ORDER — DIPHENHYDRAMINE HCL 25 MG PO CAPS
25.0000 mg | ORAL_CAPSULE | Freq: Four times a day (QID) | ORAL | Status: DC | PRN
  Administered 2016-05-11 – 2016-05-13 (×5): 25 mg via ORAL
  Filled 2016-05-11 (×5): qty 1

## 2016-05-11 MED ORDER — DIPHENHYDRAMINE HCL 25 MG PO CAPS
25.0000 mg | ORAL_CAPSULE | Freq: Three times a day (TID) | ORAL | Status: DC | PRN
  Administered 2016-05-11: 08:00:00 25 mg via ORAL
  Filled 2016-05-11: qty 1

## 2016-05-11 NOTE — Progress Notes (Signed)
[facility] [place] at [facility]   PATIENT NAME: Matthew Chaney    MR#:  [id number]  DATE OF BIRTH:  09/03/1981  SUBJECTIVE:   Continues with intermittent abd pain. Mom in the room. REVIEW OF SYSTEMS:   Review of Systems  Constitutional: Negative for chills, fever and weight loss.  HENT: Negative for ear discharge, ear pain and nosebleeds.   Eyes: Negative for blurred vision, pain and discharge.  Respiratory: Negative for sputum production, shortness of breath, wheezing and stridor.   Cardiovascular: Negative for chest pain, palpitations, orthopnea and PND.  Gastrointestinal: Positive for abdominal pain. Negative for diarrhea, nausea and vomiting.  Genitourinary: Negative for frequency and urgency.  Musculoskeletal: Negative for back pain and joint pain.  Neurological: Negative for sensory change, speech change, focal weakness and weakness.  Psychiatric/Behavioral: Negative for depression and hallucinations. The patient is not nervous/anxious.   All other systems reviewed and are negative.  Tolerating Diet:clear Tolerating PT: ambulatory  DRUG ALLERGIES:   Allergies  Allergen Reactions  . Vicodin [Hydrocodone-Acetaminophen] Hives and Rash    VITALS:  Blood pressure 128/76, pulse 94, temperature 97.8 F (36.6 C), temperature source Oral, resp. rate 19, height 5\' 9"  (1.753 m), weight 190 lb 4.8 oz (86.3 kg), SpO2 100 %.  PHYSICAL EXAMINATION:   Physical Exam  GENERAL:  35 y.o.-year-old patient lying in the bed with no acute distress.  EYES: Pupils equal, round, reactive to light and accommodation. No scleral icterus. Extraocular muscles intact.  HEENT: Head atraumatic, normocephalic. Oropharynx and nasopharynx clear.  NECK:  Supple, no jugular venous distention. No thyroid enlargement, no tenderness.  LUNGS: Normal breath sounds bilaterally, no wheezing, rales, rhonchi. No use of accessory muscles of respiration.  CARDIOVASCULAR: S1, S2 normal.  No murmurs, rubs, or gallops.  ABDOMEN: Soft, tender + LLQ, nondistended. Bowel sounds present. No organomegaly or mass.  EXTREMITIES: No cyanosis, clubbing or edema b/l.    NEUROLOGIC: Cranial nerves II through XII are intact. No focal Motor or sensory deficits b/l.   PSYCHIATRIC:  patient is alert and oriented x 3.  SKIN: No obvious rash, lesion, or ulcer.   LABORATORY PANEL:  CBC  Recent Labs Lab 05/10/16 0041  WBC 8.5  HGB 14.2  HCT 41.7  PLT 248    Chemistries   Recent Labs Lab 05/10/16 0041  NA 137  K 3.5  CL 104  CO2 25  GLUCOSE 119*  BUN 12  CREATININE 1.11  CALCIUM 9.1  AST 22  ALT 25  ALKPHOS 99  BILITOT 0.6   Cardiac Enzymes No results for input(s): TROPONINI in the last 168 hours. RADIOLOGY:  Dg Abdomen 1 View  Result Date: 05/10/2016 CLINICAL DATA:  Acute onset of lower abdominal pain. Initial encounter. EXAM: ABDOMEN - 1 VIEW COMPARISON:  CT of the abdomen and pelvis from 03/22/2016 FINDINGS: The visualized bowel gas pattern is unremarkable. Scattered air and stool filled loops of colon are seen; no abnormal dilatation of small bowel loops is seen to suggest small bowel obstruction. No free intra-abdominal air is identified, though evaluation for free air is limited on a single supine view. The visualized osseous structures are within normal limits; the sacroiliac joints are unremarkable in appearance. IMPRESSION: Unremarkable bowel gas pattern; no free intra-abdominal air seen. Moderate amount of stool noted in the colon. Electronically Signed   By: Roanna RaiderJeffery  Chang M.D.   On: 05/10/2016 01:21   Ct Abdomen Pelvis W Contrast  Result Date: 05/10/2016 CLINICAL DATA:  Midline pelvic pain  for 5 days. EXAM: CT ABDOMEN AND PELVIS WITH CONTRAST TECHNIQUE: Multidetector CT imaging of the abdomen and pelvis was performed using the standard protocol following bolus administration of intravenous contrast. CONTRAST:  100mL ISOVUE-300 IOPAMIDOL (ISOVUE-300) INJECTION 61%  COMPARISON:  03/22/2016 FINDINGS: Lower chest and abdominal wall:  No contributory findings. Hepatobiliary: No focal liver abnormality.No evidence of biliary obstruction or stone. Pancreas: Unremarkable. Spleen: Unremarkable. Adrenals/Urinary Tract: Negative adrenals. No hydronephrosis or stone. Unremarkable bladder. Stomach/Bowel: Inflammation around a focally thickened sigmoid diverticulum with local, new colonic wall thickening. Inflammation is separate from that seen previously in the mid descending colon, where there has been normalization. No abscess or free air. Colonic diverticulosis is extensive, especially for age. Normal appendix. Internal hemorrhoids noted. Reproductive:Negative Vascular/Lymphatic: Negative . Mild enlargement of IMA lymph nodes which are presumably reactive. Other: No ascites or pneumoperitoneum. Musculoskeletal: No acute abnormalities. IMPRESSION: 1. Sigmoid diverticulitis without abscess or free air. Recently seen descending colon diverticulitis is resolved. 2. Extensive colonic diverticulosis for age. Electronically Signed   By: Marnee SpringJonathon  Watts M.D.   On: 05/10/2016 08:18   ASSESSMENT AND PLAN:  Matthew LodgeJoshua Neuroth  is a 35 y.o. male with a known history of Diverticulitis in the past along with history of GERD comes to the emergency room within a week with complaints of left lower quadrant abdominal pain sharp minutes and aggravation. Having bowel movement  1. Recurrent left lower quadrant acute diverticulitis -Fourth episode since March 2017 -CT abdomen shows sigmoid colon diverticulitis. -cont patient on IV Zosyn -IV fluids -Clear liquid diet -When necessary pain meds -Surgical consultation appreciated with Dr Ely-recommends surgical removal of diseased colon when current episode resolves  2. GERD Continue PPI  3. DVT prophylaxis subcutaneous Lovenox  Case discussed with Care Management/Social Worker. Management plans discussed with the patient, family and they are  in agreement.  CODE STATUS: full  DVT Prophylaxis: lovenox TOTAL TIME TAKING CARE OF THIS PATIENT: 40 minutes.  >50% time spent on counselling and coordination of care  POSSIBLE D/C IN 1-2 DAYS, DEPENDING ON CLINICAL CONDITION.  Note: This dictation was prepared with Dragon dictation along with smaller phrase technology. Any transcriptional errors that result from this process are unintentional.  Joniel Graumann M.D on 05/11/2016 at 4:51 PM  Between 7am to 6pm - Pager - [phone]  After 6pm go to [email or web address] - password [email or web address]  [facility]  Office  [phone]  CC: Primary care physician; Lucia EstelleKAYSIN, ALEXANDER, MD

## 2016-05-11 NOTE — Consult Note (Signed)
Matthew LodgeJoshua Chaney is a 35 y.o. male  with recurrent diverticulitis.  HPI: He has had 4 episodes of diverticulitis since beginning 2017. His most recent episode came on several days ago with recurrence of his left lower quadrant left suprapubic abdominal pain. He had mild constipation with mild nausea and 1 episode of vomiting. He denies any fever or chills but reports feeling hot. He went to the emergency room where he was started on antibiotic therapy using Cipro and Flagyl. He's been hospitalized before for this problem has been treated on 3 occasions with outpatient Cipro and Flagyl.  CT scan demonstrated stranding and thickening and pericolonic fluid consistent with diverticulitis with no evidence for perforation or abscess formation. His white cell count was normal. There is no sign of any significant disturbance of his electrolytes.  He denies any history of hepatitis yellow jaundice pancreatitis peptic ulcer disease or gallbladder disease. He has a history gastroesophageal reflux disease. He's had no abdominal surgery.  Denies cardiac disease hypertension diabetes or thyroid problems. He is followed regularly by his primary care physician.  Past Medical History:  Diagnosis Date  . Diverticulitis   . GERD (gastroesophageal reflux disease)    Past Surgical History:  Procedure Laterality Date  . NOSE SURGERY     Social History   Social History  . Marital status: Single    Spouse name: N/A  . Number of children: N/A  . Years of education: N/A   Social History Main Topics  . Smoking status: Current Some Day Smoker  . Smokeless tobacco: Never Used  . Alcohol use Yes     Comment: occasionaly  . Drug use: Unknown  . Sexual activity: Not Asked   Other Topics Concern  . None   Social History Narrative  . None    Review of Systems: Review of Systems  Constitutional: Negative for chills and fever.  HENT: Negative.   Eyes: Negative.   Respiratory: Negative for cough, shortness  of breath and wheezing.   Cardiovascular: Negative for chest pain and palpitations.  Gastrointestinal: Positive for abdominal pain, constipation, heartburn, nausea and vomiting.  Genitourinary: Negative.   Musculoskeletal: Negative.   Skin: Negative.   Neurological: Negative.   Psychiatric/Behavioral: Negative.     PHYSICAL EXAM: BP 128/76 (BP Location: Left Arm)   Pulse 94   Temp 97.8 F (36.6 C) (Oral)   Resp 19   Ht 5\' 9"  (1.753 m)   Wt 86.3 kg (190 lb 4.8 oz)   SpO2 100%   BMI 28.10 kg/m   Physical Exam  Constitutional: He is oriented to person, place, and time. He appears well-developed and well-nourished. No distress.  HENT:  Head: Normocephalic and atraumatic.  Eyes: EOM are normal. Pupils are equal, round, and reactive to light.  Neck: Normal range of motion. Neck supple.  Cardiovascular: Normal rate, regular rhythm and normal heart sounds.   Pulmonary/Chest: Effort normal and breath sounds normal. He has no wheezes. He has no rales.  Abdominal: Soft. Bowel sounds are normal. There is tenderness. There is guarding.  Musculoskeletal: Normal range of motion. He exhibits no edema or deformity.  Neurological: He is alert and oriented to person, place, and time.  Skin: Skin is warm and dry. He is not diaphoretic.  Psychiatric: He has a normal mood and affect. His behavior is normal.   His abdomen is moderately tender in the suprapubic and left lower quadrant area. He has some mild guarding but no rebound. He has hypoactive but present bowel sounds.  Impression/Plan: I have independently reviewed his CT scan. This gentleman appears to have recurrent diverticulitis although it was on a different place. I'm in his left colon that was previously on CT. There is no evidence of perforation at the present time.  I agree with changing his antibiotic mix since he is not responding to his outpatient therapy. He likely has resistant organisms in his colon and it would be of benefit  treated with a different medication. Our goal will be to get him through this episode without considering surgery then set him up for elective procedure when he is resolve the current problems. The outcome would be significantly better and the need for possible colostomy much less if he can have surgery during a period when he is not symptomatic. This plan was outlined to the patient and his family and they're in agreement.   Tiney Rougealph Ely III, MD  05/11/2016, 1:57 PM

## 2016-05-12 MED ORDER — POLYETHYLENE GLYCOL 3350 17 G PO PACK
17.0000 g | PACK | Freq: Two times a day (BID) | ORAL | Status: DC
  Administered 2016-05-12 (×2): 17 g via ORAL
  Filled 2016-05-12 (×2): qty 1

## 2016-05-12 MED ORDER — ONDANSETRON HCL 4 MG/2ML IJ SOLN
4.0000 mg | INTRAMUSCULAR | Status: DC | PRN
  Administered 2016-05-12 – 2016-05-14 (×3): 4 mg via INTRAVENOUS
  Filled 2016-05-12 (×3): qty 2

## 2016-05-12 MED ORDER — SODIUM CHLORIDE 0.9 % IV SOLN
1.0000 g | Freq: Every day | INTRAVENOUS | Status: DC
  Administered 2016-05-12 – 2016-05-14 (×3): 1 g via INTRAVENOUS
  Filled 2016-05-12 (×3): qty 1

## 2016-05-12 MED ORDER — LACTULOSE 10 GM/15ML PO SOLN
30.0000 g | Freq: Two times a day (BID) | ORAL | Status: DC
  Administered 2016-05-12 (×2): 30 g via ORAL
  Filled 2016-05-12 (×2): qty 60

## 2016-05-12 MED ORDER — DOCUSATE SODIUM 100 MG PO CAPS
100.0000 mg | ORAL_CAPSULE | Freq: Two times a day (BID) | ORAL | Status: DC
  Administered 2016-05-12 – 2016-05-14 (×4): 100 mg via ORAL
  Filled 2016-05-12 (×4): qty 1

## 2016-05-12 NOTE — Progress Notes (Signed)
Dr Allena KatzPatel made aware of pt emesis x1 new order to change zofran to every 4 hours

## 2016-05-12 NOTE — Progress Notes (Signed)
[facility] [place] at [facility]   PATIENT NAME: Matthew Chaney    MR#:  [id number]  DATE OF BIRTH:  Mar 12, 1981  SUBJECTIVE:   Continues with intermittent abd pain. No BM so far REVIEW OF SYSTEMS:   Review of Systems  Constitutional: Negative for chills, fever and weight loss.  HENT: Negative for ear discharge, ear pain and nosebleeds.   Eyes: Negative for blurred vision, pain and discharge.  Respiratory: Negative for sputum production, shortness of breath, wheezing and stridor.   Cardiovascular: Negative for chest pain, palpitations, orthopnea and PND.  Gastrointestinal: Positive for abdominal pain. Negative for diarrhea, nausea and vomiting.  Genitourinary: Negative for frequency and urgency.  Musculoskeletal: Negative for back pain and joint pain.  Neurological: Negative for sensory change, speech change, focal weakness and weakness.  Psychiatric/Behavioral: Negative for depression and hallucinations. The patient is not nervous/anxious.   All other systems reviewed and are negative.  Tolerating Diet:clear Tolerating PT: ambulatory  DRUG ALLERGIES:   Allergies  Allergen Reactions  . Vicodin [Hydrocodone-Acetaminophen] Hives and Rash    VITALS:  Blood pressure 126/85, pulse 76, temperature 98.2 F (36.8 C), temperature source Oral, resp. rate 18, height 5\' 9"  (1.753 m), weight 190 lb 4.8 oz (86.3 kg), SpO2 97 %.  PHYSICAL EXAMINATION:   Physical Exam  GENERAL:  35 y.o.-year-old patient lying in the bed with no acute distress.  EYES: Pupils equal, round, reactive to light and accommodation. No scleral icterus. Extraocular muscles intact.  HEENT: Head atraumatic, normocephalic. Oropharynx and nasopharynx clear.  NECK:  Supple, no jugular venous distention. No thyroid enlargement, no tenderness.  LUNGS: Normal breath sounds bilaterally, no wheezing, rales, rhonchi. No use of accessory muscles of respiration.  CARDIOVASCULAR: S1, S2 normal. No  murmurs, rubs, or gallops.  ABDOMEN: Soft, tender + LLQ, nondistended. Bowel sounds present. No organomegaly or mass.  EXTREMITIES: No cyanosis, clubbing or edema b/l.    NEUROLOGIC: Cranial nerves II through XII are intact. No focal Motor or sensory deficits b/l.   PSYCHIATRIC:  patient is alert and oriented x 3.  SKIN: No obvious rash, lesion, or ulcer.   LABORATORY PANEL:  CBC  Recent Labs Lab 05/10/16 0041  WBC 8.5  HGB 14.2  HCT 41.7  PLT 248    Chemistries   Recent Labs Lab 05/10/16 0041  NA 137  K 3.5  CL 104  CO2 25  GLUCOSE 119*  BUN 12  CREATININE 1.11  CALCIUM 9.1  AST 22  ALT 25  ALKPHOS 99  BILITOT 0.6   Cardiac Enzymes No results for input(s): TROPONINI in the last 168 hours. RADIOLOGY:  No results found. ASSESSMENT AND PLAN:  Matthew Chaney  is a 35 y.o. male with a known history of Diverticulitis in the past along with history of GERD comes to the emergency room within a week with complaints of left lower quadrant abdominal pain sharp minutes and aggravation. Having bowel movement  1. Recurrent left lower quadrant acute diverticulitis -Fourth episode since March 2017 -CT abdomen shows sigmoid colon diverticulitis. -on IV Zosyn---d/w Dr Michela Pitcherely recommends change to IV invanz -IV fluids -Clear liquid diet -When necessary pain meds -Surgical consultation appreciated with Dr Michela PitcherEly  2. GERD Continue PPI  3. DVT prophylaxis subcutaneous Lovenox  Case discussed with Care Management/Social Worker. Management plans discussed with the patient, family and they are in agreement.  CODE STATUS: full  DVT Prophylaxis: lovenox TOTAL TIME TAKING CARE OF THIS PATIENT: 25 minutes.  >50% time spent on  counselling and coordination of care  POSSIBLE D/C IN 1-2 DAYS, DEPENDING ON CLINICAL CONDITION.  Note: This dictation was prepared with Dragon dictation along with smaller phrase technology. Any transcriptional errors that result from this process are  unintentional.  Johnnye Sandford M.D on 05/12/2016 at 11:09 AM  Between 7am to 6pm - Pager - [phone]  After 6pm go to [email or web address] - password [email or web address] [facility]  [place]  Office  [phone]  CC: Primary care physician; Lucia EstelleKAYSIN, ALEXANDER, MD

## 2016-05-12 NOTE — Care Management (Signed)
Admitted to [facility] with the diagnosis of acute diverticulitis. Mother is Nedra Haingela Farrington [phone]). Primary care physician is listed as Dr. Johny ChessKaysin. EPIC is showing a visit with Dr. Gillis EndsLisa Streab 05/11/16. EPIC is listing a follow-up appointment with Dr. Mechele CollinElliott 06/05/16. No insurance listed Gwenette GreetBrenda S Graycie Halley RN MSN CCM Care Management [phone]

## 2016-05-12 NOTE — Progress Notes (Signed)
Subjective:   He continues to have significant abdominal pain. He's not had any fever he is not nauseated. He still has not had a bowel movement.  Vital signs in last 24 hours: Temp:  [98.2 F (36.8 C)] 98.2 F (36.8 C) (08/18 0506) Pulse Rate:  [76-90] 76 (08/18 0506) Resp:  [18] 18 (08/18 0506) BP: (117-126)/(85-89) 126/85 (08/18 0506) SpO2:  [94 %-97 %] 97 % (08/18 0506) Last BM Date: 05/07/16  Intake/Output from previous day: No intake/output data recorded.  Exam:  He does have some marked left lower quadrant and suprapubic pain. He has some mild guarding with no rebound. His breathing easily with no distress with no wheezes or rales.  Lab Results:  CBC  Recent Labs  05/10/16 0041  WBC 8.5  HGB 14.2  HCT 41.7  PLT 248   CMP     Component Value Date/Time   NA 137 05/10/2016 0041   NA 137 05/31/2014 1529   K 3.5 05/10/2016 0041   K 3.5 05/31/2014 1529   CL 104 05/10/2016 0041   CL 108 (H) 05/31/2014 1529   CO2 25 05/10/2016 0041   CO2 25 05/31/2014 1529   GLUCOSE 119 (H) 05/10/2016 0041   GLUCOSE 83 05/31/2014 1529   BUN 12 05/10/2016 0041   BUN 17 05/31/2014 1529   CREATININE 1.11 05/10/2016 0041   CREATININE 1.18 05/31/2014 1529   CALCIUM 9.1 05/10/2016 0041   CALCIUM 8.7 05/31/2014 1529   PROT 8.3 (H) 05/10/2016 0041   PROT 8.0 05/31/2014 1529   ALBUMIN 4.3 05/10/2016 0041   ALBUMIN 3.9 05/31/2014 1529   AST 22 05/10/2016 0041   AST 16 05/31/2014 1529   ALT 25 05/10/2016 0041   ALT 34 05/31/2014 1529   ALKPHOS 99 05/10/2016 0041   ALKPHOS 83 05/31/2014 1529   BILITOT 0.6 05/10/2016 0041   BILITOT 1.0 05/31/2014 1529   GFRNONAA >60 05/10/2016 0041   GFRNONAA >60 05/31/2014 1529   GFRAA >60 05/10/2016 0041   GFRAA >60 05/31/2014 1529   PT/INR No results for input(s): LABPROT, INR in the last 72 hours.  Studies/Results: No results found.  Assessment/Plan: He seems to be improving very slowly. I don't see any evidence that he has an abscess.  It may be reasonable consider changing his antibiotic therapy Invanz and then he does not improve in the next 48 hours repeat his CT scan. He continues to have problems with resolution of symptoms or develops perforation and/or abscess we would consider surgical intervention acutely. Otherwise we would like to delay surgery until he has a symptom-free period.

## 2016-05-13 NOTE — Progress Notes (Addendum)
[facility] - [place] [facility]   PATIENT NAME: Matthew Chaney    MR#:  [id number]  DATE OF BIRTH:  10-20-1980  SUBJECTIVE:   Pt reports having 5 stools. Pain better REVIEW OF SYSTEMS:   Review of Systems  Constitutional: Negative for chills, fever and weight loss.  HENT: Negative for ear discharge, ear pain and nosebleeds.   Eyes: Negative for blurred vision, pain and discharge.  Respiratory: Negative for sputum production, shortness of breath, wheezing and stridor.   Cardiovascular: Negative for chest pain, palpitations, orthopnea and PND.  Gastrointestinal: Positive for abdominal pain. Negative for diarrhea, nausea and vomiting.  Genitourinary: Negative for frequency and urgency.  Musculoskeletal: Negative for back pain and joint pain.  Neurological: Negative for sensory change, speech change, focal weakness and weakness.  Psychiatric/Behavioral: Negative for depression and hallucinations. The patient is not nervous/anxious.   All other systems reviewed and are negative.  Tolerating Diet:clear Tolerating PT: ambulatory  DRUG ALLERGIES:   Allergies  Allergen Reactions  . Vicodin [Hydrocodone-Acetaminophen] Hives and Rash    VITALS:  Blood pressure 118/77, pulse 66, temperature 98.2 F (36.8 C), temperature source Oral, resp. rate 18, height 5\' 9"  (1.753 m), weight 190 lb 4.8 oz (86.3 kg), SpO2 100 %.  PHYSICAL EXAMINATION:   Physical Exam  GENERAL:  35 y.o.-year-old patient lying in the bed with no acute distress.  EYES: Pupils equal, round, reactive to light and accommodation. No scleral icterus. Extraocular muscles intact.  HEENT: Head atraumatic, normocephalic. Oropharynx and nasopharynx clear.  NECK:  Supple, no jugular venous distention. No thyroid enlargement, no tenderness.  LUNGS: Normal breath sounds bilaterally, no wheezing, rales, rhonchi. No use of accessory muscles of respiration.  CARDIOVASCULAR: S1, S2 normal. No murmurs,  rubs, or gallops.  ABDOMEN: Soft, tender + LLQ, nondistended. Bowel sounds present. No organomegaly or mass.  EXTREMITIES: No cyanosis, clubbing or edema b/l.    NEUROLOGIC: Cranial nerves II through XII are intact. No focal Motor or sensory deficits b/l.   PSYCHIATRIC:  patient is alert and oriented x 3.  SKIN: No obvious rash, lesion, or ulcer.   LABORATORY PANEL:  CBC  Recent Labs Lab 05/10/16 0041  WBC 8.5  HGB 14.2  HCT 41.7  PLT 248    Chemistries   Recent Labs Lab 05/10/16 0041  NA 137  K 3.5  CL 104  CO2 25  GLUCOSE 119*  BUN 12  CREATININE 1.11  CALCIUM 9.1  AST 22  ALT 25  ALKPHOS 99  BILITOT 0.6   Cardiac Enzymes No results for input(s): TROPONINI in the last 168 hours. RADIOLOGY:  No results found. ASSESSMENT AND PLAN:  Matthew Chaney  is a 35 y.o. male with a known history of Diverticulitis in the past along with history of GERD comes to the emergency room within a week with complaints of left lower quadrant abdominal pain sharp minutes and aggravation. Having bowel movement  1. Recurrent left lower quadrant acute diverticulitis -Fourth episode since March 2017 -CT abdomen shows sigmoid colon diverticulitis. -on IV Zosyn---d/w Dr Michela Pitcherely recommends change to IV invanz -IV fluids -Clear liquid diet -When necessary pain meds -Surgical consultation appreciated with Dr Michela PitcherEly -hold off Bowel prep since pt had several BM's  2. GERD Continue PPI  3. DVT prophylaxis subcutaneous Lovenox  Case discussed with Care Management/Social Worker. Management plans discussed with the patient, family and they are in agreement.  CODE STATUS: full  DVT Prophylaxis: lovenox TOTAL TIME TAKING CARE OF THIS PATIENT:  25 minutes.  >50% time spent on counselling and coordination of care  POSSIBLE D/C IN 1-2 DAYS, DEPENDING ON CLINICAL CONDITION.  Note: This dictation was prepared with Dragon dictation along with smaller phrase technology. Any transcriptional errors  that result from this process are unintentional.  Ameka Krigbaum M.D on 05/13/2016 at 8:09 AM  Between 7am to 6pm - Pager - [phone]  After 6pm go to [email or web address] - password [email or web address] [facility]  [place] [place] [facility]  Office  [phone]  CC: Primary care physician; Lucia EstelleKAYSIN, ALEXANDER, MD

## 2016-05-14 MED ORDER — AMOXICILLIN-POT CLAVULANATE 875-125 MG PO TABS: 1.0000 | ORAL_TABLET | Freq: Two times a day (BID) | ORAL | 0 refills | Status: DC

## 2016-05-14 MED ORDER — AMOXICILLIN-POT CLAVULANATE 875-125 MG PO TABS: 1.0000 | ORAL_TABLET | Freq: Two times a day (BID) | ORAL | Status: DC

## 2016-05-14 MED ORDER — HYDROMORPHONE HCL 1 MG/ML IJ SOLN: 1.0000 mg | Freq: Four times a day (QID) | INTRAMUSCULAR | Status: DC | PRN

## 2016-05-14 NOTE — Discharge Summary (Signed)
[facility] - [place] [facility]   PATIENT NAME: Matthew Chaney    MR#:  [id number]  DATE OF BIRTH:  30-Nov-1980  DATE OF ADMISSION:  05/10/2016 ADMITTING PHYSICIAN: Enedina FinnerSona Hallie Ishida, MD  DATE OF DISCHARGE: 8/201/7  PRIMARY CARE PHYSICIAN: Lucia EstelleKAYSIN, ALEXANDER, MD    ADMISSION DIAGNOSIS:  Diverticulitis of intestine without perforation or abscess without bleeding [K57.92]  DISCHARGE DIAGNOSIS:  Acute recurrent sigmoid diverticulitis (4th episode this year)  SECONDARY DIAGNOSIS:   Past Medical History:  Diagnosis Date  . Diverticulitis   . GERD (gastroesophageal reflux disease)     HOSPITAL COURSE:  Matthew DillJoshua Baldwinis a 35 y.o. malewith a known history of Diverticulitis in the past along with history of GERD comes to the emergency room within a week with complaints of left lower quadrant abdominal pain sharp minutes and aggravation. Having bowel movement  1. Recurrent left lower quadrant acute diverticulitis -Fourth episode since March 2017 -CT abdomen shows sigmoid colon diverticulitis. -on IV Zosyn---d/w Dr Michela Pitcherely recommends change to IV invanz---much improved now. Change to po augmentin at discharge -received IV fluids -Clear liquid diet---FLD and now soft diet -When necessary pain meds -Surgical consultation appreciated with Dr Ely-pt will f/u as out pt  2. GERD Continue PPI  3. DVT prophylaxis subcutaneous Lovenox  D/c home today after IV abx CONSULTS OBTAINED:  Treatment Team:  Tiney Rougealph Ely III, MD  DRUG ALLERGIES:   Allergies  Allergen Reactions  . Vicodin [Hydrocodone-Acetaminophen] Hives and Rash    DISCHARGE MEDICATIONS:   Current Discharge Medication List    START taking these medications   Details  amoxicillin-clavulanate (AUGMENTIN) 875-125 MG tablet Take 1 tablet by mouth every 12 (twelve) hours. Qty: 12 tablet, Refills: 0      CONTINUE these medications which have NOT CHANGED   Details  esomeprazole (NEXIUM) 20 MG  capsule Take 20 mg by mouth every morning.     ondansetron (ZOFRAN) 4 MG tablet Take 1 tablet (4 mg total) by mouth every 8 (eight) hours as needed for nausea or vomiting. Qty: 10 tablet, Refills: 0    oxyCODONE-acetaminophen (ROXICET) 5-325 MG tablet Take 1-2 tablets by mouth every 4 (four) hours as needed for severe pain. Qty: 10 tablet, Refills: 0    Psyllium (METAMUCIL FIBER PO) Take by mouth See admin instructions. Mix 2 tablespoonfuls of powder in 8 oz of water and drink by mouth 3 times a day.      STOP taking these medications     ciprofloxacin (CIPRO) 500 MG tablet      metroNIDAZOLE (FLAGYL) 500 MG tablet         If you experience worsening of your admission symptoms, develop shortness of breath, life threatening emergency, suicidal or homicidal thoughts you must seek medical attention immediately by calling 911 or calling your MD immediately  if symptoms less severe.  You Must read complete instructions/literature along with all the possible adverse reactions/side effects for all the Medicines you take and that have been prescribed to you. Take any new Medicines after you have completely understood and accept all the possible adverse reactions/side effects.   Please note  You were cared for by a hospitalist during your hospital stay. If you have any questions about your discharge medications or the care you received while you were in the hospital after you are discharged, you can call the unit and asked to speak with the hospitalist on call if the hospitalist that took care of you is not available. Once you are  discharged, your primary care physician will handle any further medical issues. Please note that NO REFILLS for any discharge medications will be authorized once you are discharged, as it is imperative that you return to your primary care physician (or establish a relationship with a primary care physician if you do not have one) for your aftercare needs so that they can  reassess your need for medications and monitor your lab values. Today   SUBJECTIVE   Feels a lot better  VITAL SIGNS:  Blood pressure 108/80, pulse 81, temperature 98.8 F (37.1 C), temperature source Oral, resp. rate 18, height 5\' 9"  (1.753 m), weight 190 lb 4.8 oz (86.3 kg), SpO2 96 %.  I/O:    Intake/Output Summary (Last 24 hours) at 05/14/16 1031 Last data filed at 05/14/16 0400  Gross per 24 hour  Intake              100 ml  Output                0 ml  Net              100 ml    PHYSICAL EXAMINATION:  GENERAL:  35 y.o.-year-old patient lying in the bed with no acute distress.  EYES: Pupils equal, round, reactive to light and accommodation. No scleral icterus. Extraocular muscles intact.  HEENT: Head atraumatic, normocephalic. Oropharynx and nasopharynx clear.  NECK:  Supple, no jugular venous distention. No thyroid enlargement, no tenderness.  LUNGS: Normal breath sounds bilaterally, no wheezing, rales,rhonchi or crepitation. No use of accessory muscles of respiration.  CARDIOVASCULAR: S1, S2 normal. No murmurs, rubs, or gallops.  ABDOMEN: Soft, non-tender, non-distended. Bowel sounds present. No organomegaly or mass.  EXTREMITIES: No pedal edema, cyanosis, or clubbing.  NEUROLOGIC: Cranial nerves II through XII are intact. Muscle strength 5/5 in all extremities. Sensation intact. Gait not checked.  PSYCHIATRIC: The patient is alert and oriented x 3.  SKIN: No obvious rash, lesion, or ulcer.   DATA REVIEW:   CBC   Recent Labs Lab 05/10/16 0041  WBC 8.5  HGB 14.2  HCT 41.7  PLT 248    Chemistries   Recent Labs Lab 05/10/16 0041  NA 137  K 3.5  CL 104  CO2 25  GLUCOSE 119*  BUN 12  CREATININE 1.11  CALCIUM 9.1  AST 22  ALT 25  ALKPHOS 99  BILITOT 0.6    Microbiology Results   No results found for this or any previous visit (from the past 240 hour(s)).  RADIOLOGY:  No results found.   Management plans discussed with the patient, family and  they are in agreement.  CODE STATUS:     Code Status Orders        Start     Ordered   05/10/16 1018  Full code  Continuous     05/10/16 1017    Code Status History    Date Active Date Inactive Code Status Order ID Comments User Context   This patient has a current code status but no historical code status.      TOTAL TIME TAKING CARE OF THIS PATIENT: 40 minutes.    Inara Dike M.D on 05/14/2016 at 10:31 AM  Between 7am to 6pm - Pager - [phone] After 6pm go to [email or web address] - password [email or web address] [facility]  [place] [place] [facility]  Office  [phone]  CC: Primary care physician; Lucia EstelleKAYSIN, ALEXANDER, MD

## 2016-05-14 NOTE — Progress Notes (Addendum)
Pt is being discharged home. Discharge instructions given and explained to pt. Pt verbalized understanding. Meds and f/u appointments reviewed with pt. RX given.

## 2016-05-14 NOTE — Discharge Instructions (Signed)
Soft diet

## 2016-05-23 ENCOUNTER — Other Ambulatory Visit
Admission: RE | Admit: 2016-05-23 | Discharge: 2016-05-23 | Disposition: A | Discharge status: Home / Self Care | Payer: Self-pay | Source: Ambulatory Visit | Attending: Surgery | Admitting: Surgery

## 2016-05-23 ENCOUNTER — Ambulatory Visit (INDEPENDENT_AMBULATORY_CARE_PROVIDER_SITE_OTHER): Payer: Self-pay | Admitting: Surgery

## 2016-05-23 ENCOUNTER — Telehealth: Payer: Self-pay

## 2016-05-23 ENCOUNTER — Encounter: Payer: Self-pay | Admitting: Surgery

## 2016-05-23 ENCOUNTER — Other Ambulatory Visit: Payer: Self-pay

## 2016-05-23 VITALS — BP 125/84 | HR 123 | Temp 97.3°F | Ht 69.0 in | Wt 181.0 lb

## 2016-05-23 DIAGNOSIS — K5792 Diverticulitis of intestine, part unspecified, without perforation or abscess without bleeding: Secondary | ICD-10-CM

## 2016-05-23 DIAGNOSIS — G8929 Other chronic pain: Secondary | ICD-10-CM | POA: Insufficient documentation

## 2016-05-23 DIAGNOSIS — R1031 Right lower quadrant pain: Secondary | ICD-10-CM

## 2016-05-23 LAB — URINALYSIS COMPLETE WITH MICROSCOPIC (ARMC ONLY)
Bacteria, UA: NONE SEEN
GLUCOSE, UA: NEGATIVE mg/dL
HGB URINE DIPSTICK: NEGATIVE
Leukocytes, UA: NEGATIVE
Nitrite: NEGATIVE
Protein, ur: 30 mg/dL — AB
SQUAMOUS EPITHELIAL / LPF: NONE SEEN
Specific Gravity, Urine: 1.025 (ref 1.005–1.030)
pH: 5 (ref 5.0–8.0)

## 2016-05-23 LAB — CBC WITH DIFFERENTIAL/PLATELET
BASOS PCT: 1 %
Basophils Absolute: 0.1 10*3/uL (ref 0–0.1)
EOS ABS: 0 10*3/uL (ref 0–0.7)
EOS PCT: 0 %
HCT: 43.3 % (ref 40.0–52.0)
Hemoglobin: 14.4 g/dL (ref 13.0–18.0)
LYMPHS ABS: 1.5 10*3/uL (ref 1.0–3.6)
Lymphocytes Relative: 15 %
MCH: 27.9 pg (ref 26.0–34.0)
MCHC: 33.2 g/dL (ref 32.0–36.0)
MCV: 83.9 fL (ref 80.0–100.0)
Monocytes Absolute: 0.9 10*3/uL (ref 0.2–1.0)
Monocytes Relative: 10 %
NEUTROS PCT: 74 %
Neutro Abs: 7.1 10*3/uL — ABNORMAL HIGH (ref 1.4–6.5)
PLATELETS: 380 10*3/uL (ref 150–440)
RBC: 5.16 MIL/uL (ref 4.40–5.90)
RDW: 13.2 % (ref 11.5–14.5)
WBC: 9.6 10*3/uL (ref 3.8–10.6)

## 2016-05-23 LAB — CREATININE, SERUM
CREATININE: 1.2 mg/dL (ref 0.61–1.24)
GFR calc Af Amer: >60 mL/min (ref >60)

## 2016-05-23 LAB — BUN: BUN: 14 mg/dL (ref 6–20)

## 2016-05-23 MED ORDER — AMOXICILLIN-POT CLAVULANATE 875-125 MG PO TABS: 1.0000 | ORAL_TABLET | Freq: Two times a day (BID) | ORAL | 0 refills | Status: DC

## 2016-05-23 NOTE — Telephone Encounter (Signed)
LVM fpr patient to call regarding labs that were done today.  All are normal.

## 2016-05-23 NOTE — Progress Notes (Signed)
Outpatient Surgical Follow Up  05/23/2016  Matthew Chaney is an 35 y.o. male.   CC: Left lower quadrant abdominal pain  HPI: This patient with a history of left lower quadrant abdominal pain and a diagnosis of diverticulitis. He had no sign of perforation on prior CT scan he denies fevers or chills he vomited one time yesterday but thinks it was in relation to the castor oil that he took yesterday. He has taken cast roll oil twice to aid in bowel movements stating that he is been constipated. He describes left lower quadrant pain which is no better than it was previously. A nice melena or hematochezia  Past Medical History:  Diagnosis Date  . Diverticulitis   . GERD (gastroesophageal reflux disease)     Past Surgical History:  Procedure Laterality Date  . NOSE SURGERY    . NOSE SURGERY  2010    Family History  Problem Relation Age of Onset  . Diverticulitis Father   . Breast cancer Maternal Grandmother     Social History:  reports that he has been smoking.  He has never used smokeless tobacco. He reports that he drinks alcohol. His drug history is not on file.  Allergies:  Allergies  Allergen Reactions  . Vicodin [Hydrocodone-Acetaminophen] Hives and Rash  . Other Other (See Comments)    Seasonal allergies:  Sneezing, watery eyes, itchy throat. Seasonal allergies:  Sneezing, watery eyes, itchy throat.    Medications reviewed.   Review of Systems:   Review of Systems  Constitutional: Negative for chills and fever.  HENT: Negative.   Eyes: Negative.   Respiratory: Negative.   Cardiovascular: Negative.   Gastrointestinal: Positive for abdominal pain, constipation, nausea and vomiting. Negative for blood in stool, diarrhea, heartburn and melena.  Genitourinary: Negative.   Musculoskeletal: Negative.   Skin: Negative.   Neurological: Negative.   Endo/Heme/Allergies: Negative.   Psychiatric/Behavioral: Negative.      Physical Exam:  BP 125/84 (BP Location: Right  Arm, Patient Position: Sitting, Cuff Size: Normal)   Pulse (!) 123   Temp 97.3 F (36.3 C) (Oral)   Ht 5\' 9"  (1.753 m)   Wt 181 lb (82.1 kg)   BMI 26.73 kg/m   Physical Exam  Constitutional: He is oriented to person, place, and time and well-developed, well-nourished, and in no distress. No distress.  HENT:  Head: Normocephalic and atraumatic.  Eyes: Right eye exhibits no discharge. Left eye exhibits no discharge. No scleral icterus.  Neck: Normal range of motion.  Cardiovascular: Normal rate, regular rhythm and normal heart sounds.   Pulmonary/Chest: Effort normal and breath sounds normal. No respiratory distress. He has no wheezes. He has no rales. He exhibits no tenderness.  Abdominal: Soft. He exhibits no distension. There is tenderness. There is no rebound and no guarding.  Tenderness in the left lower quadrant without peritoneal signs  Musculoskeletal: Normal range of motion. He exhibits no edema.  Lymphadenopathy:    He has no cervical adenopathy.  Neurological: He is alert and oriented to person, place, and time.  Skin: Skin is warm and dry. No rash noted. He is not diaphoretic. No erythema.  Psychiatric: Mood and affect normal.  Vitals reviewed.     No results found for this or any previous visit (from the past 48 hour(s)). No results found.  Assessment/Plan:  CT scan is reviewed as are her hospital records. There was no sign of perforation on prior CT scan. His last CT scan was 12 days ago and I  would like to repeat that at this point and refill his Augmentin. We will call him with the results. This patient may require surgical intervention at some point because of his young age. Also of note he has a strong family history of diverticulitis but also a fairly strong family history of for ulcerative colitis. Patient also asked for refill on his narcotics but I believe that that may be contributing to his constipation which puts him into a vicious cycle of constipation and  diverticulitis and pain. This was discussed with him in my rationale for not refilling narcotics was reviewed. He is instructed to go to the emergency room should he worsen at any time.  Lattie Haw, MD, FACS

## 2016-05-23 NOTE — Patient Instructions (Signed)
We have scheduled a Ct [facility] tomorrow [facility] . We need for you to pick up your prep today and follow the instructions that will be provided. We will send you to the lab today for blood work. We sent your medicine to your pharmacy. We will call you with your results. Please call our office if you have questions.

## 2016-05-24 ENCOUNTER — Ambulatory Visit
Admission: RE | Admit: 2016-05-24 | Discharge: 2016-05-24 | Disposition: A | Discharge status: Home / Self Care | Payer: Self-pay | Source: Ambulatory Visit | Attending: Surgery | Admitting: Surgery

## 2016-05-24 ENCOUNTER — Telehealth: Payer: Self-pay

## 2016-05-24 DIAGNOSIS — K5730 Diverticulosis of large intestine without perforation or abscess without bleeding: Secondary | ICD-10-CM | POA: Insufficient documentation

## 2016-05-24 DIAGNOSIS — G8929 Other chronic pain: Secondary | ICD-10-CM | POA: Insufficient documentation

## 2016-05-24 DIAGNOSIS — R1031 Right lower quadrant pain: Secondary | ICD-10-CM | POA: Insufficient documentation

## 2016-05-24 DIAGNOSIS — R600 Localized edema: Secondary | ICD-10-CM | POA: Insufficient documentation

## 2016-05-24 DIAGNOSIS — K5732 Diverticulitis of large intestine without perforation or abscess without bleeding: Secondary | ICD-10-CM | POA: Insufficient documentation

## 2016-05-24 MED ORDER — IOPAMIDOL (ISOVUE-300) INJECTION 61%
100.0000 mL | Freq: Once | INTRAVENOUS | Status: AC | PRN
  Administered 2016-05-24: 100 mL via INTRAVENOUS

## 2016-05-24 MED ORDER — CIPROFLOXACIN HCL 500 MG PO TABS: 500.0000 mg | ORAL_TABLET | Freq: Two times a day (BID) | ORAL | 0 refills | Status: DC

## 2016-05-24 MED ORDER — METRONIDAZOLE 500 MG PO TABS: 500.0000 mg | ORAL_TABLET | Freq: Three times a day (TID) | ORAL | 0 refills | Status: DC

## 2016-05-24 NOTE — Telephone Encounter (Signed)
Spoke with patient to let him know all labs were normal. Patient verbalized understanding.

## 2016-05-24 NOTE — Telephone Encounter (Signed)
Spoke with Dr. Orvis BrillLoflin regarding CT Scan. She would like for patient to stop Augmentin, begin taking Cipro and Flagyl, and be seen by Dr. Excell Seltzerooper next week. With the understanding that if he becomes worse then he will need to be seen immediately or go to the Emergency Room if we are not open.

## 2016-05-31 ENCOUNTER — Encounter: Payer: Self-pay | Admitting: Surgery

## 2016-05-31 ENCOUNTER — Ambulatory Visit (INDEPENDENT_AMBULATORY_CARE_PROVIDER_SITE_OTHER): Payer: Self-pay | Admitting: Surgery

## 2016-05-31 VITALS — BP 122/85 | HR 103 | Temp 98.0°F | Wt 180.0 lb

## 2016-05-31 DIAGNOSIS — K5792 Diverticulitis of intestine, part unspecified, without perforation or abscess without bleeding: Secondary | ICD-10-CM

## 2016-05-31 MED ORDER — CIPROFLOXACIN HCL 500 MG PO TABS: 500.0000 mg | ORAL_TABLET | Freq: Two times a day (BID) | ORAL | 0 refills | Status: DC

## 2016-05-31 MED ORDER — METRONIDAZOLE 500 MG PO TABS: 500.0000 mg | ORAL_TABLET | Freq: Three times a day (TID) | ORAL | 0 refills | Status: DC

## 2016-05-31 NOTE — Patient Instructions (Signed)
We have refilled your Cipro and Flagyl. Continue these medications until they are completed.  We will have you follow-up next week with Dr. Excell Seltzerooper in clinic as scheduled below.  Please call with any questions or concerns that you may have in the meantime.

## 2016-05-31 NOTE — Progress Notes (Signed)
Outpatient Surgical Follow Up  05/31/2016  Matthew Chaney is an 35 y.o. male.   CC: Diverticulitis  HPI: This patient with a history of diverticulitis his CAT scan last week showed additional and worsening stranding but overall the patient states he feels better now he's not had any fevers he has had some night sweats but has not taken his temperature he is currently taking Cipro Flagyl. He is having bowel movements which are normal. He has had some weight loss because he is not eating very much but is taking liquids and fruits. Dietary discussion was held as well. Abdominal pain is much improved  Past Medical History:  Diagnosis Date  . Diverticulitis   . GERD (gastroesophageal reflux disease)     Past Surgical History:  Procedure Laterality Date  . NOSE SURGERY    . NOSE SURGERY  2010    Family History  Problem Relation Age of Onset  . Diverticulitis Father   . Breast cancer Maternal Grandmother     Social History:  reports that he has been smoking.  He has never used smokeless tobacco. He reports that he drinks alcohol. He reports that he does not use drugs.  Allergies:  Allergies  Allergen Reactions  . Vicodin [Hydrocodone-Acetaminophen] Hives and Rash  . Other Other (See Comments)    Seasonal allergies:  Sneezing, watery eyes, itchy throat. Seasonal allergies:  Sneezing, watery eyes, itchy throat.    Medications reviewed.   Review of Systems:   Review of Systems  Constitutional: Positive for weight loss. Negative for chills, fever and malaise/fatigue.  HENT: Negative.   Eyes: Negative.   Respiratory: Negative.   Cardiovascular: Negative.   Gastrointestinal: Positive for abdominal pain. Negative for blood in stool, constipation, diarrhea, heartburn, melena, nausea and vomiting.  Genitourinary: Negative.   Musculoskeletal: Negative.   Skin: Negative.   Neurological: Negative.   Endo/Heme/Allergies: Negative.   Psychiatric/Behavioral: Negative.       Physical Exam:  BP 122/85   Pulse (!) 103   Temp 98 F (36.7 C) (Oral)   Wt 180 lb (81.6 kg)   BMI 26.58 kg/m   Physical Exam  Constitutional: He is oriented to person, place, and time and well-developed, well-nourished, and in no distress. No distress.  HENT:  Head: Normocephalic and atraumatic.  Eyes: Right eye exhibits no discharge. Left eye exhibits no discharge. No scleral icterus.  Neck: Normal range of motion.  Cardiovascular: Normal rate and regular rhythm.   Pulmonary/Chest: Effort normal. No respiratory distress.  Abdominal: Soft. He exhibits no distension. There is tenderness. There is no rebound and no guarding.  Less tender left lower quadrant with no peritoneal signs  Musculoskeletal: Normal range of motion. He exhibits no edema or tenderness.  Lymphadenopathy:    He has no cervical adenopathy.  Neurological: He is alert and oriented to person, place, and time.  Skin: Skin is warm and dry. No rash noted. He is not diaphoretic. No erythema.  Psychiatric: Mood and affect normal.  Vitals reviewed.     No results found for this or any previous visit (from the past 48 hour(s)). No results found.  Assessment/Plan:  Last week's CT scan suggested some worsening but no sign of perforation. He feels better now however and I see no sign this suggest that he has a perforation. He is to continue his oral antibiotics and I will see him next week. He wants to have resection because he is tired of this problem but I reminded him that resection too  soon could end up in a colostomy. He understood that and we'll see him next week  [name] E Merina Behrendt, MD, FACS

## 2016-06-07 ENCOUNTER — Encounter: Payer: Self-pay | Admitting: Surgery

## 2016-06-07 ENCOUNTER — Ambulatory Visit (INDEPENDENT_AMBULATORY_CARE_PROVIDER_SITE_OTHER): Payer: Self-pay | Admitting: Surgery

## 2016-06-07 VITALS — BP 119/84 | HR 98 | Temp 100.0°F | Ht 69.0 in | Wt 182.2 lb

## 2016-06-07 DIAGNOSIS — K5792 Diverticulitis of intestine, part unspecified, without perforation or abscess without bleeding: Secondary | ICD-10-CM

## 2016-06-07 DIAGNOSIS — R197 Diarrhea, unspecified: Secondary | ICD-10-CM

## 2016-06-07 DIAGNOSIS — A09 Infectious gastroenteritis and colitis, unspecified: Secondary | ICD-10-CM

## 2016-06-07 NOTE — Patient Instructions (Signed)
We will send you to [facility] today for a stool test and we will call you with the results and orders if we need to change your antibiotics.  Directions to Medical Mall: When leaving our office, go right. Go all of the way down to the very end of the hallway. You will have a purple wall in front of you. You will now have a tunnel to the hospital on your left hand side. Go through this tunnel and the elevators will be on your left. Go down to the 1st floor and take a slight left. The very first desk on the right hand side is the registration desk.  Please refer to your [facility] Pre-care sheet for further information.

## 2016-06-07 NOTE — Progress Notes (Signed)
Outpatient Surgical Follow Up  06/07/2016  Matthew Chaney is an 35 y.o. male.   CC: Acute diverticulitis  HPI: This patient with acute diverticulitis his pain has completely resolved he has no further left lower quadrant pain. However he is having increasing diarrhea and had a low-grade temp in the office today. He is currently on antibiotics.  Past Medical History:  Diagnosis Date  . Diverticulitis   . GERD (gastroesophageal reflux disease)     Past Surgical History:  Procedure Laterality Date  . NOSE SURGERY    . NOSE SURGERY  2010    Family History  Problem Relation Age of Onset  . Diverticulitis Father   . Breast cancer Maternal Grandmother     Social History:  reports that he has been smoking.  He has never used smokeless tobacco. He reports that he drinks alcohol. He reports that he does not use drugs.  Allergies:  Allergies  Allergen Reactions  . Vicodin [Hydrocodone-Acetaminophen] Hives and Rash  . Other Other (See Comments)    Seasonal allergies:  Sneezing, watery eyes, itchy throat. Seasonal allergies:  Sneezing, watery eyes, itchy throat.    Medications reviewed.   Review of Systems:   Review of Systems  Constitutional: Positive for fever. Negative for chills and weight loss.  HENT: Negative.   Eyes: Negative.   Respiratory: Negative.   Cardiovascular: Negative.   Gastrointestinal: Positive for diarrhea. Negative for abdominal pain, blood in stool, constipation, heartburn, nausea and vomiting.  Genitourinary: Negative.   Musculoskeletal: Negative.   Skin: Negative.   Neurological: Negative.   Endo/Heme/Allergies: Negative.   Psychiatric/Behavioral: Negative.      Physical Exam:  BP 119/84   Pulse 98   Temp 100 F (37.8 C) (Oral)   Ht 5\' 9"  (1.753 m)   Wt 182 lb 3.2 oz (82.6 kg)   BMI 26.91 kg/m   Physical Exam  Constitutional: He is oriented to person, place, and time and well-developed, well-nourished, and in no distress. No distress.   HENT:  Head: Normocephalic and atraumatic.  Eyes: Right eye exhibits no discharge. Left eye exhibits no discharge. No scleral icterus.  Abdominal: Soft. He exhibits no distension. There is no tenderness. There is no rebound and no guarding.  Musculoskeletal: Normal range of motion. He exhibits no edema or tenderness.  Neurological: He is alert and oriented to person, place, and time.  Skin: Skin is warm and dry. He is not diaphoretic. No erythema.  Psychiatric: Mood and affect normal.  Vitals reviewed.     No results found for this or any previous visit (from the past 48 hour(s)). No results found.  Assessment/Plan:  Acute divided diverticulitis is resolving. He is currently on antibiotics and expressing diarrhea with low-grade fever I will check a C. difficile PCR and call him with results and adjust his antibiotics accordingly and follow him up with me in 2 weeks.  [name] E Cooper, MD, FACS

## 2016-06-12 ENCOUNTER — Telehealth: Payer: Self-pay | Admitting: Surgery

## 2016-06-12 NOTE — Telephone Encounter (Signed)
Returned phone call to patient at this time to let him know that the specimen was rejected by the lab and will need to submit a new specimen.  No answer. Left voicemail asking for return phone call.

## 2016-06-12 NOTE — Telephone Encounter (Signed)
Spoke with patient at this time. He was clear [facility] He signed specimen in with the lab technician. There is not a result in system for this lab.   Called Lab at hospital and spoke with Iani. She will investigate what has happened with the specimen and will call me back as soon as this has been determined.

## 2016-06-12 NOTE — Telephone Encounter (Signed)
Patient saw Dr Excell Seltzerooper on 9/13. He is calling to check on his test results from his stool sample. Please call and advise if those test results are in. Thanks.

## 2016-06-13 ENCOUNTER — Other Ambulatory Visit
Admission: RE | Admit: 2016-06-13 | Discharge: 2016-06-13 | Disposition: A | Discharge status: Home / Self Care | Payer: Self-pay | Source: Other Acute Inpatient Hospital | Attending: Surgery | Admitting: Surgery

## 2016-06-13 ENCOUNTER — Other Ambulatory Visit: Payer: Self-pay

## 2016-06-13 DIAGNOSIS — R197 Diarrhea, unspecified: Secondary | ICD-10-CM

## 2016-06-13 DIAGNOSIS — A09 Infectious gastroenteritis and colitis, unspecified: Secondary | ICD-10-CM | POA: Insufficient documentation

## 2016-06-13 LAB — C DIFFICILE QUICK SCREEN W PCR REFLEX
C DIFFICILE (CDIFF) INTERP: NOT DETECTED
C DIFFICILE (CDIFF) TOXIN: NEGATIVE
C Diff antigen: NEGATIVE

## 2016-06-13 NOTE — Telephone Encounter (Signed)
Called patient once again at this time to let him know that he would need to stop by and give another sample. Patient is not very happy but understands the process. He will stop by today to get this done.  Will call patient as soon as results are received.

## 2016-06-14 NOTE — Telephone Encounter (Signed)
Results from C-Diff testing received. Called patient to notify him of this result.  I explained to patient that he can take Imodium to help with the diarrhea and if this is not better within 24 hours of taking the doses of Imodium that he should call back so that I may call him in another medication to help with this. I encouraged him to eat more potassium rich foods until diarrhea has stopped, drink Gatorade to help with all electrolytes, and to replace water as much as possible. Patient verbalizes understanding of this and will call with any further questions or concerns.

## 2016-06-20 ENCOUNTER — Telehealth: Payer: Self-pay | Admitting: Surgery

## 2016-06-20 NOTE — Telephone Encounter (Signed)
Patient would like for you to call him regarding a medication. He said he only wanted to talk to you. That was it.

## 2016-06-21 NOTE — Telephone Encounter (Signed)
Spoke with patient at this time. He is feeling much better since stopping Cipro and also has been using Imodium intermittently. Denies pain. Denies nausea and vomiting. Informed patient to follow-up as scheduled with Dr. Excell Seltzerooper and call before should be begin to have symptoms once again. Pt verbalized understanding.

## 2016-06-27 ENCOUNTER — Ambulatory Visit (INDEPENDENT_AMBULATORY_CARE_PROVIDER_SITE_OTHER): Payer: Self-pay | Admitting: Surgery

## 2016-06-27 ENCOUNTER — Encounter: Payer: Self-pay | Admitting: Surgery

## 2016-06-27 VITALS — BP 141/75 | HR 85 | Temp 98.6°F | Ht 69.0 in | Wt 182.0 lb

## 2016-06-27 DIAGNOSIS — K5792 Diverticulitis of intestine, part unspecified, without perforation or abscess without bleeding: Secondary | ICD-10-CM

## 2016-06-27 MED ORDER — NA SULFATE-K SULFATE-MG SULF 17.5-3.13-1.6 GM/177ML PO SOLN: 1.0000 | ORAL | 0 refills | Status: DC

## 2016-06-27 NOTE — Patient Instructions (Addendum)
You will need to have a Colonoscopy. This has been scheduled for 07/13/16. Your Prep will be sent to your pharmacy. Please see information packet given. Please see your follow up appointment with Dr.Cooper listed below.

## 2016-06-27 NOTE — Progress Notes (Signed)
Outpatient Surgical Follow Up  06/27/2016  Matthew Chaney is an 35 y.o. male.   CC: Diverticulitis  HPI: Is patient with known diverticulitis he is completed his antibiotic course. His diarrhea has resolved and he was C. difficile negative. He's feeling much better and has no abdominal pain today. Chills.  Past Medical History:  Diagnosis Date  . Diverticulitis   . GERD (gastroesophageal reflux disease)     Past Surgical History:  Procedure Laterality Date  . NOSE SURGERY    . NOSE SURGERY  2010    Family History  Problem Relation Age of Onset  . Diverticulitis Father   . Breast cancer Maternal Grandmother     Social History:  reports that he has been smoking Cigars.  He has never used smokeless tobacco. He reports that he drinks alcohol. He reports that he does not use drugs.  Allergies:  Allergies  Allergen Reactions  . Vicodin [Hydrocodone-Acetaminophen] Hives and Rash  . Other Other (See Comments)    Seasonal allergies:  Sneezing, watery eyes, itchy throat. Seasonal allergies:  Sneezing, watery eyes, itchy throat.    Medications reviewed.   Review of Systems:   Review of Systems  Constitutional: Negative for chills and fever.  HENT: Negative.   Eyes: Negative.   Respiratory: Negative.   Cardiovascular: Negative.   Gastrointestinal: Negative for abdominal pain, blood in stool, diarrhea, heartburn, nausea and vomiting.  Genitourinary: Negative.   Musculoskeletal: Negative.   Skin: Negative.   Neurological: Negative.   Endo/Heme/Allergies: Negative.   Psychiatric/Behavioral: Negative.      Physical Exam:  BP (!) 141/75   Pulse 85   Temp 98.6 F (37 C) (Oral)   Ht 5\' 9"  (1.753 m)   Wt 182 lb (82.6 kg)   BMI 26.88 kg/m   Physical Exam  Constitutional: He is oriented to person, place, and time and well-developed, well-nourished, and in no distress. No distress.  HENT:  Head: Normocephalic and atraumatic.  Eyes: Right eye exhibits no discharge.  Left eye exhibits no discharge. No scleral icterus.  Neck: Normal range of motion.  Abdominal: Soft. He exhibits no distension. There is no tenderness. There is no rebound and no guarding.  Musculoskeletal: Normal range of motion. He exhibits no edema.  Lymphadenopathy:    He has no cervical adenopathy.  Neurological: He is alert and oriented to person, place, and time.  Skin: Skin is warm and dry. He is not diaphoretic.  Psychiatric: Mood and affect normal.  Vitals reviewed.     No results found for this or any previous visit (from the past 48 hour(s)). No results found.  Assessment/Plan:  Patient doing very very well after treatment for acute diverticulitis. He was C. difficile negative. Require resection at some point due to his young age. He has a high risk of recurrent diverticulitis. Without a mind I would like to have him see a GI physician concerning colonoscopy and follow-up after that procedure for planning of elective colon resection  [name] E Mariela Rex, MD, FACS

## 2016-07-11 NOTE — Discharge Instructions (Signed)

## 2016-07-13 ENCOUNTER — Ambulatory Visit: Payer: Self-pay | Admitting: Anesthesiology

## 2016-07-13 ENCOUNTER — Encounter
Admission: RE | Disposition: A | Discharge status: Home / Self Care | Payer: Self-pay | Source: Ambulatory Visit | Attending: Gastroenterology

## 2016-07-13 ENCOUNTER — Ambulatory Visit
Admission: RE | Admit: 2016-07-13 | Discharge: 2016-07-13 | Disposition: A | Discharge status: Home / Self Care | Payer: Self-pay | Source: Ambulatory Visit | Attending: Gastroenterology | Admitting: Gastroenterology

## 2016-07-13 DIAGNOSIS — K219 Gastro-esophageal reflux disease without esophagitis: Secondary | ICD-10-CM | POA: Insufficient documentation

## 2016-07-13 DIAGNOSIS — F17290 Nicotine dependence, other tobacco product, uncomplicated: Secondary | ICD-10-CM | POA: Insufficient documentation

## 2016-07-13 DIAGNOSIS — K5730 Diverticulosis of large intestine without perforation or abscess without bleeding: Secondary | ICD-10-CM | POA: Insufficient documentation

## 2016-07-13 DIAGNOSIS — K640 First degree hemorrhoids: Secondary | ICD-10-CM | POA: Insufficient documentation

## 2016-07-13 DIAGNOSIS — Z8379 Family history of other diseases of the digestive system: Secondary | ICD-10-CM | POA: Insufficient documentation

## 2016-07-13 HISTORY — PX: COLONOSCOPY WITH PROPOFOL: SHX5780

## 2016-07-13 SURGERY — COLONOSCOPY WITH PROPOFOL
Anesthesia: Monitor Anesthesia Care | Wound class: Contaminated

## 2016-07-13 MED ORDER — PROPOFOL 10 MG/ML IV BOLUS
INTRAVENOUS | Status: DC | PRN
  Administered 2016-07-13: 50 mg via INTRAVENOUS
  Administered 2016-07-13: 70 mg via INTRAVENOUS
  Administered 2016-07-13: 10 mg via INTRAVENOUS
  Administered 2016-07-13: 100 mg via INTRAVENOUS
  Administered 2016-07-13: 40 mg via INTRAVENOUS
  Administered 2016-07-13: 50 mg via INTRAVENOUS
  Administered 2016-07-13: 30 mg via INTRAVENOUS

## 2016-07-13 MED ORDER — ACETAMINOPHEN 325 MG PO TABS: 325.0000 mg | ORAL_TABLET | ORAL | Status: DC | PRN

## 2016-07-13 MED ORDER — LIDOCAINE HCL (CARDIAC) 20 MG/ML IV SOLN
INTRAVENOUS | Status: DC | PRN
  Administered 2016-07-13: 50 mg via INTRAVENOUS

## 2016-07-13 MED ORDER — STERILE WATER FOR IRRIGATION IR SOLN
Status: DC | PRN
  Administered 2016-07-13: 10:00:00

## 2016-07-13 MED ORDER — LACTATED RINGERS IV SOLN
INTRAVENOUS | Status: DC
  Administered 2016-07-13: 10:00:00 via INTRAVENOUS

## 2016-07-13 MED ORDER — ACETAMINOPHEN 160 MG/5ML PO SOLN: 325.0000 mg | ORAL | Status: DC | PRN

## 2016-07-13 SURGICAL SUPPLY — 23 items

## 2016-07-13 NOTE — H&P (Signed)
  Lucilla Lame, MD [facility] [address] [place], [place] [address] Phone: [phone] Fax : [phone]  Primary Care Physician:  Charlann Boxer, MD Primary Gastroenterologist:  Dr. Allen Norris  Pre-Procedure History & Physical: HPI:  Matthew Chaney is a 35 y.o. male is here for an colonoscopy.   Past Medical History:  Diagnosis Date  . Diverticulitis   . GERD (gastroesophageal reflux disease)     Past Surgical History:  Procedure Laterality Date  . NOSE SURGERY  2010    Prior to Admission medications   Medication Sig Start Date End Date Taking? Authorizing Provider  esomeprazole (NEXIUM) 20 MG capsule Take 20 mg by mouth every morning.    Yes Historical Provider, MD  Na Sulfate-K Sulfate-Mg Sulf (SUPREP BOWEL PREP KIT) 17.5-3.13-1.6 GM/180ML SOLN Take 1 kit by mouth as directed. 06/27/16  Yes Lucilla Lame, MD    Allergies as of 06/27/2016 - Review Complete 06/27/2016  Allergen Reaction Noted  . Vicodin [hydrocodone-acetaminophen] Hives and Rash 06/20/2015  . Other Other (See Comments) 12/27/2011    Family History  Problem Relation Age of Onset  . Diverticulitis Father   . Breast cancer Maternal Grandmother     Social History   Social History  . Marital status: Single    Spouse name: N/A  . Number of children: N/A  . Years of education: N/A   Occupational History  . Not on file.   Social History Main Topics  . Smoking status: Current Some Day Smoker    Types: Cigars  . Smokeless tobacco: Never Used     Comment: once a month  . Alcohol use No  . Drug use: No  . Sexual activity: Not on file   Other Topics Concern  . Not on file   Social History Narrative  . No narrative on file    Review of Systems: See HPI, otherwise negative ROS  Physical Exam: BP (!) 116/91   Pulse 88   Temp 98 F (36.7 C) (Tympanic)   Resp 16   Ht _0  (1.753 m)   Wt 181 lb (82.1 kg)   SpO2 98%   BMI 26.73 kg/m  General:   Alert,  pleasant and cooperative in  NAD Head:  Normocephalic and atraumatic. Neck:  Supple; no masses or thyromegaly. Lungs:  Clear throughout to auscultation.    Heart:  Regular rate and rhythm. Abdomen:  Soft, nontender and nondistended. Normal bowel sounds, without guarding, and without rebound.   Neurologic:  Alert and  oriented x4;  grossly normal neurologically.  Impression/Plan: Matthew Chaney is here for an colonoscopy to be performed for diverticulitis  Risks, benefits, limitations, and alternatives regarding  colonoscopy have been reviewed with the patient.  Questions have been answered.  All parties agreeable.   Lucilla Lame, MD  07/13/2016, 9:48 AM

## 2016-07-13 NOTE — Op Note (Signed)
[facility]astroenterology Patient Name: Matthew Chaney Procedure Date: 07/13/2016 9:39 AM MRN: [id number] Account #: [id number] Date of Birth: 12-13-1980 Admit Type: Outpatient Age: 34 Room: [facility] OR ROOM 01 Gender: Male Note Status: Finalized Procedure:            Colonoscopy Indications:          Follow-up of diverticulitis Providers:            Midge Minium MD, MD Referring MD:         Adah Salvage. Excell Seltzer (Referring MD) Medicines:            Propofol per Anesthesia Complications:        No immediate complications. Procedure:            Pre-Anesthesia Assessment:                       - Prior to the procedure, a History and Physical was                        performed, and patient medications and allergies were                        reviewed. The patient's tolerance of previous                        anesthesia was also reviewed. The risks and benefits of                        the procedure and the sedation options and risks were                        discussed with the patient. All questions were                        answered, and informed consent was obtained. Prior                        Anticoagulants: The patient has taken no previous                        anticoagulant or antiplatelet agents. ASA Grade                        Assessment: II - A patient with mild systemic disease.                        After reviewing the risks and benefits, the patient was                        deemed in satisfactory condition to undergo the                        procedure.                       After obtaining informed consent, the colonoscope was                        passed under direct vision. Throughout the procedure,  the patient's blood pressure, pulse, and oxygen                        saturations were monitored continuously. The Olympus                        CF-HQ190L Colonoscope (S#. [id number]) was introduced                         through the anus and advanced to the the cecum,                        identified by appendiceal orifice and ileocecal valve.                        The colonoscopy was performed without difficulty. The                        patient tolerated the procedure well. The quality of                        the bowel preparation was excellent. Findings:      The perianal and digital rectal examinations were normal.      Multiple small-mouthed diverticula were found in the entire colon.      Non-bleeding internal hemorrhoids were found during retroflexion. The       hemorrhoids were Grade II (internal hemorrhoids that prolapse but reduce       spontaneously). Impression:           - Diverticulosis in the entire examined colon.                       - Non-bleeding internal hemorrhoids.                       - No specimens collected. Recommendation:       - Discharge patient to home.                       - Resume previous diet.                       - Continue present medications. Procedure Code(s):    --- Professional ---                       [phone], Colonoscopy, flexible; diagnostic, including                        collection of specimen(s) by brushing or washing, when                        performed (separate procedure) Diagnosis Code(s):    --- Professional ---                       W11.91K57.32, Diverticulitis of large intestine without                        perforation or abscess without bleeding CPT copyright 2016 American Medical Association. All rights reserved. The codes documented in this report are preliminary and upon coder review may  be revised to meet current compliance  requirements. Midge Minium MD, MD 07/13/2016 10:08:16 AM This report has been signed electronically. Number of Addenda: 0 Note Initiated On: 07/13/2016 9:39 AM Scope Withdrawal Time: 0 hours 6 minutes 9 seconds  Total Procedure Duration: 0 hours 7 minutes 46 seconds       [facility]

## 2016-07-13 NOTE — Anesthesia Preprocedure Evaluation (Addendum)
Anesthesia Evaluation  Patient identified by MRN, date of birth, ID band Patient awake    Reviewed: Allergy & Precautions, H&P , NPO status , Patient's Chart, lab work & pertinent test results, reviewed documented beta blocker date and time   Airway Mallampati: II  TM Distance: >3 FB Neck ROM: full    Dental no notable dental hx.    Pulmonary Current Smoker,    Pulmonary exam normal breath sounds clear to auscultation       Cardiovascular Exercise Tolerance: Good negative cardio ROS   Rhythm:regular Rate:Normal     Neuro/Psych negative neurological ROS  negative psych ROS   GI/Hepatic Neg liver ROS, GERD  ,  Endo/Other  negative endocrine ROS  Renal/GU negative Renal ROS  negative genitourinary   Musculoskeletal   Abdominal   Peds  Hematology negative hematology ROS (+)   Anesthesia Other Findings   Reproductive/Obstetrics negative OB ROS                            Anesthesia Physical Anesthesia Plan  ASA: II  Anesthesia Plan: MAC   Post-op Pain Management:    Induction:   Airway Management Planned:   Additional Equipment:   Intra-op Plan:   Post-operative Plan:   Informed Consent: I have reviewed the patients History and Physical, chart, labs and discussed the procedure including the risks, benefits and alternatives for the proposed anesthesia with the patient or authorized representative who has indicated his/her understanding and acceptance.   Dental Advisory Given  Plan Discussed with: CRNA and Anesthesiologist  Anesthesia Plan Comments:        Anesthesia Quick Evaluation

## 2016-07-13 NOTE — Anesthesia Postprocedure Evaluation (Signed)
Anesthesia Post Note  Patient: Matthew Chaney  Procedure(s) Performed: Procedure(s) (LRB): COLONOSCOPY WITH PROPOFOL (N/A)  Patient location during evaluation: PACU Anesthesia Type: MAC Level of consciousness: awake and alert Pain management: pain level controlled Vital Signs Assessment: post-procedure vital signs reviewed and stable Respiratory status: spontaneous breathing, nonlabored ventilation and respiratory function stable Cardiovascular status: stable and blood pressure returned to baseline Anesthetic complications: no    Alta CorningBacon, Ioan Landini S

## 2016-07-13 NOTE — Anesthesia Procedure Notes (Signed)
Procedure Name: MAC Date/Time: 07/13/2016 9:53 AM Performed by: Maryan RuedWILSON, Tericka Devincenzi M Pre-anesthesia Checklist: Patient identified, Emergency Drugs available, Suction available and Patient being monitored Patient Re-evaluated:Patient Re-evaluated prior to inductionOxygen Delivery Method: Nasal cannula

## 2016-07-13 NOTE — Transfer of Care (Signed)
Immediate Anesthesia Transfer of Care Note  Patient: Matthew Chaney  Procedure(s) Performed: Procedure(s): COLONOSCOPY WITH PROPOFOL (N/A)  Patient Location: PACU  Anesthesia Type: MAC  Level of Consciousness: awake, alert  and patient cooperative  Airway and Oxygen Therapy: Patient Spontanous Breathing and Patient connected to supplemental oxygen  Post-op Assessment: Post-op Vital signs reviewed, Patient's Cardiovascular Status Stable, Respiratory Function Stable, Patent Airway and No signs of Nausea or vomiting  Post-op Vital Signs: Reviewed and stable  Complications: No apparent anesthesia complications

## 2016-07-14 ENCOUNTER — Encounter: Payer: Self-pay | Admitting: Gastroenterology

## 2016-07-19 ENCOUNTER — Ambulatory Visit (INDEPENDENT_AMBULATORY_CARE_PROVIDER_SITE_OTHER): Payer: Self-pay | Admitting: Surgery

## 2016-07-19 ENCOUNTER — Encounter: Payer: Self-pay | Admitting: Surgery

## 2016-07-19 VITALS — BP 141/88 | HR 93 | Temp 99.2°F | Ht 69.0 in | Wt 186.4 lb

## 2016-07-19 DIAGNOSIS — K5792 Diverticulitis of intestine, part unspecified, without perforation or abscess without bleeding: Secondary | ICD-10-CM

## 2016-07-19 MED ORDER — POLYETHYLENE GLYCOL 3350 17 GM/SCOOP PO POWD: 1.0000 | Freq: Once | ORAL | 0 refills | Status: AC

## 2016-07-19 MED ORDER — BISACODYL 5 MG PO TBEC
5.0000 mg | DELAYED_RELEASE_TABLET | Freq: Once | ORAL | 0 refills | Status: AC
Start: 2016-07-19 — End: 2016-07-19

## 2016-07-19 MED ORDER — POLYETHYLENE GLYCOL 3350 17 GM/SCOOP PO POWD: 1.0000 | Freq: Once | ORAL | Status: DC

## 2016-07-19 NOTE — Patient Instructions (Signed)
We have scheduled your surgery for 08/15/16. Please see your bowel prep instruction sheet. [facility]. Your medicines have been sent to the pharmacy. Please call our office if you have any questions or concerns.

## 2016-07-19 NOTE — Progress Notes (Signed)
Outpatient Surgical Follow Up  07/19/2016  Matthew Chaney is an 35 y.o. male.   CC: Left lower quadrant pain  HPI: This a patient with a history of left lower quadrant pain her workup showing acute diverticulitis. He has completely resolved his pain and had a recent colonoscopy which was negative. Have considerable diverticulosis. Currently he has no pain no fevers no chills no nausea vomiting or diarrhea. Had no melena or hematochezia.  Past Medical History:  Diagnosis Date  . Diverticulitis   . GERD (gastroesophageal reflux disease)     Past Surgical History:  Procedure Laterality Date  . COLONOSCOPY WITH PROPOFOL N/A 07/13/2016   Procedure: COLONOSCOPY WITH PROPOFOL;  Surgeon: Midge Minium, MD;  Location: [facility];  Service: Endoscopy;  Laterality: N/A;  . NOSE SURGERY  2010    Family History  Problem Relation Age of Onset  . Diverticulitis Father   . Breast cancer Maternal Grandmother     Social History:  reports that he has been smoking Cigars.  He has never used smokeless tobacco. He reports that he does not drink alcohol or use drugs.  Allergies:  Allergies  Allergen Reactions  . Vicodin [Hydrocodone-Acetaminophen] Hives and Rash  . Other Other (See Comments)    Seasonal allergies:  Sneezing, watery eyes, itchy throat. Seasonal allergies:  Sneezing, watery eyes, itchy throat.    Medications reviewed.   Review of Systems:   Review of Systems  Constitutional: Negative.   HENT: Negative.   Eyes: Negative.   Respiratory: Negative.   Cardiovascular: Negative.   Gastrointestinal: Negative.   Genitourinary: Negative.   Musculoskeletal: Negative.   Skin: Negative.   Neurological: Negative.   Endo/Heme/Allergies: Negative.   Psychiatric/Behavioral: Negative.      Physical Exam:  BP (!) 141/88   Pulse 93   Temp 99.2 F (37.3 C) (Oral)   Ht 5\' 9"  (1.753 m)   Wt 186 lb 6.4 oz (84.6 kg)   BMI 27.53 kg/m   Physical Exam  Constitutional: He  is oriented to person, place, and time and well-developed, well-nourished, and in no distress. No distress.  HENT:  Head: Normocephalic and atraumatic.  Eyes: Pupils are equal, round, and reactive to light. Right eye exhibits no discharge. Left eye exhibits no discharge. No scleral icterus.  Neck: Normal range of motion. Neck supple.  Cardiovascular: Normal rate and regular rhythm.   Pulmonary/Chest: Effort normal. No respiratory distress.  Abdominal: Soft. He exhibits no distension. There is no tenderness. There is no rebound and no guarding.  Musculoskeletal: Normal range of motion. He exhibits no edema.  Lymphadenopathy:    He has no cervical adenopathy.  Neurological: He is alert and oriented to person, place, and time.  Skin: Skin is warm and dry. No rash noted. He is not diaphoretic. No erythema.  Tattoos  Psychiatric: Mood and affect normal.  Vitals reviewed.     No results found for this or any previous visit (from the past 48 hour(s)). No results found.  Assessment/Plan:  This a patient with a history of acute diverticulitis which has resolved. He is had a recent colonoscopy showing extensive diverticulosis. At his young age M recommending sigmoid colon resection and only sigmoid resection. I discussed why you don't remove the entire colon. I also discussed with him the options of observation and the rationale for surgery and the risks of bleeding infection recurrence of symptoms failure to resolve his symptoms anastomotic leak and ileostomy or colostomy multiple questions were answered for him he  understood and agreed to proceed.  He will require another bowel prep and hospitalization postop.  [name] E Cooper, MD, FACS

## 2016-07-19 NOTE — Addendum Note (Signed)
Addended by: Adela PortsBONICHE, Oriya Kettering on: 07/19/2016 11:30 AM   Modules accepted: Orders

## 2016-07-20 ENCOUNTER — Telehealth: Payer: Self-pay | Admitting: Surgery

## 2016-07-20 NOTE — Telephone Encounter (Signed)
Pt advised of pre op date/time and sx date. Sx: 08/15/16 with Dr Cooper--Open Colon Resection Sigmoid. Pre op: 08/07/16 @ 8:00am--office.   Patient made aware to call [phone], between 1-3:00pm the day before surgery, to find out what time to arrive.    Prep instructions given.

## 2016-07-26 ENCOUNTER — Other Ambulatory Visit: Payer: Self-pay | Admitting: Surgery

## 2016-07-26 DIAGNOSIS — K5792 Diverticulitis of intestine, part unspecified, without perforation or abscess without bleeding: Secondary | ICD-10-CM

## 2016-07-26 MED ORDER — BISACODYL 5 MG PO TBEC: 5.0000 mg | DELAYED_RELEASE_TABLET | Freq: Once | ORAL | 0 refills | Status: AC

## 2016-08-07 ENCOUNTER — Inpatient Hospital Stay: Admission: RE | Admit: 2016-08-07 | Payer: Self-pay | Source: Ambulatory Visit

## 2016-08-08 ENCOUNTER — Encounter
Admission: RE | Admit: 2016-08-08 | Discharge: 2016-08-08 | Disposition: A | Discharge status: Home / Self Care | Payer: Self-pay | Source: Ambulatory Visit | Attending: Surgery | Admitting: Surgery

## 2016-08-08 DIAGNOSIS — Z01818 Encounter for other preprocedural examination: Secondary | ICD-10-CM | POA: Insufficient documentation

## 2016-08-08 HISTORY — DX: Anxiety disorder, unspecified: F41.9

## 2016-08-08 LAB — COMPREHENSIVE METABOLIC PANEL
ALT: 29 U/L (ref 17–63)
ANION GAP: 9 (ref 5–15)
AST: 34 U/L (ref 15–41)
Albumin: 4.2 g/dL (ref 3.5–5.0)
Alkaline Phosphatase: 43 U/L (ref 38–126)
BILIRUBIN TOTAL: 0.4 mg/dL (ref 0.3–1.2)
BUN: 15 mg/dL (ref 6–20)
CHLORIDE: 105 mmol/L (ref 101–111)
CO2: 26 mmol/L (ref 22–32)
Calcium: 9.2 mg/dL (ref 8.9–10.3)
Creatinine, Ser: 1.09 mg/dL (ref 0.61–1.24)
GFR calc Af Amer: >60 mL/min (ref >60)
Glucose, Bld: 103 mg/dL — ABNORMAL HIGH (ref 65–99)
POTASSIUM: 4.1 mmol/L (ref 3.5–5.1)
Sodium: 140 mmol/L (ref 135–145)
TOTAL PROTEIN: 7.9 g/dL (ref 6.5–8.1)

## 2016-08-08 LAB — CBC WITH DIFFERENTIAL/PLATELET
BASOS PCT: 1 %
Basophils Absolute: 0 10*3/uL (ref 0–0.1)
EOS PCT: 1 %
Eosinophils Absolute: 0.1 10*3/uL (ref 0–0.7)
HEMATOCRIT: 45.9 % (ref 40.0–52.0)
Hemoglobin: 15.5 g/dL (ref 13.0–18.0)
Lymphocytes Relative: 31 %
Lymphs Abs: 1.7 10*3/uL (ref 1.0–3.6)
MCH: 28.6 pg (ref 26.0–34.0)
MCHC: 33.7 g/dL (ref 32.0–36.0)
MCV: 84.9 fL (ref 80.0–100.0)
MONO ABS: 0.6 10*3/uL (ref 0.2–1.0)
MONOS PCT: 12 %
NEUTROS ABS: 3.1 10*3/uL (ref 1.4–6.5)
Neutrophils Relative %: 55 %
PLATELETS: 202 10*3/uL (ref 150–440)
RBC: 5.41 MIL/uL (ref 4.40–5.90)
RDW: 16.3 % — AB (ref 11.5–14.5)
WBC: 5.5 10*3/uL (ref 3.8–10.6)

## 2016-08-08 LAB — SURGICAL PCR SCREEN
MRSA, PCR: NEGATIVE
Staphylococcus aureus: NEGATIVE

## 2016-08-08 NOTE — Pre-Procedure Instructions (Signed)
PATIENT VERY ANXIOUS, PLEASE ASK ANESTHESIA FOR SEDATION ASAP DAY OF SURGERY

## 2016-08-08 NOTE — Patient Instructions (Signed)
  Your procedure is scheduled on: 08/15/16 Report to Day Surgery.[facility]OND FLOOR To find out your arrival time please call [phone] between 1PM - 3PM on 08/14/16.  Remember: Instructions that are not followed completely may result in serious medical risk, up to and including death, or upon the discretion of your surgeon and anesthesiologist your surgery may need to be rescheduled.    __X__ 1. Do not eat food or drink liquids after midnight. No gum chewing or hard candies.     __X__ 2. No Alcohol for 24 hours before or after surgery.   __X_ 3. Do Not Smoke For 24 Hours Prior to Your Surgery.   ____ 4. Bring all medications with you on the day of surgery if instructed.    __X__ 5. Notify your doctor if there is any change in your medical condition     (cold, fever, infections).       Do not wear jewelry, make-up, hairpins, clips or nail polish.  Do not wear lotions, powders, or perfumes. You may wear deodorant.  Do not shave 48 hours prior to surgery. Men may shave face and neck.  Do not bring valuables to the hospital.    [facility] any belongings or valuables.               Contacts, dentures or bridgework may not be worn into surgery.  Leave your suitcase in the car. After surgery it may be brought to your room.  For patients admitted to the hospital, discharge time is determined by your                treatment team.   Patients discharged the day of surgery will not be allowed to drive home.   Please read over the following fact sheets that you were given: MRSA   _X___ Take these medicines the morning of surgery with A SIP OF WATER:    1.NEXIUM  2.   3.   4.  5.  6.  ____ Fleet Enema (as directed)   __X__ Use CHG Soap as directed  ____ Use inhalers on the day of surgery  ____ Stop metformin 2 days prior to surgery    ____ Take 1/2 of usual insulin dose the night before surgery and none on the morning of surgery.   ____ Stop  Coumadin/Plavix/aspirin on  ____ Stop Anti-inflammatories on    ____ Stop supplements until after surgery.    ____ Bring C-Pap to the hospital.

## 2016-08-15 ENCOUNTER — Encounter
Admission: RE | Disposition: A | Discharge status: Home / Self Care | Payer: Self-pay | Source: Ambulatory Visit | Attending: Surgery

## 2016-08-15 ENCOUNTER — Inpatient Hospital Stay: Payer: Self-pay | Admitting: *Deleted

## 2016-08-15 ENCOUNTER — Inpatient Hospital Stay
Admission: RE | Admit: 2016-08-15 | Discharge: 2016-08-20 | DRG: 331 | Disposition: A | Discharge status: Home / Self Care | Payer: Self-pay | Source: Ambulatory Visit | Attending: Surgery | Admitting: Surgery

## 2016-08-15 DIAGNOSIS — K219 Gastro-esophageal reflux disease without esophagitis: Secondary | ICD-10-CM | POA: Diagnosis present

## 2016-08-15 DIAGNOSIS — K5730 Diverticulosis of large intestine without perforation or abscess without bleeding: Secondary | ICD-10-CM | POA: Diagnosis present

## 2016-08-15 DIAGNOSIS — K5732 Diverticulitis of large intestine without perforation or abscess without bleeding: Principal | ICD-10-CM | POA: Diagnosis present

## 2016-08-15 DIAGNOSIS — F17200 Nicotine dependence, unspecified, uncomplicated: Secondary | ICD-10-CM | POA: Diagnosis present

## 2016-08-15 HISTORY — PX: COLON RESECTION SIGMOID: SHX6737

## 2016-08-15 LAB — CBC
HEMATOCRIT: 44.4 % (ref 40.0–52.0)
HEMOGLOBIN: 15.1 g/dL (ref 13.0–18.0)
MCH: 29 pg (ref 26.0–34.0)
MCHC: 33.9 g/dL (ref 32.0–36.0)
MCV: 85.5 fL (ref 80.0–100.0)
Platelets: 248 10*3/uL (ref 150–440)
RBC: 5.19 MIL/uL (ref 4.40–5.90)
RDW: 15.6 % — ABNORMAL HIGH (ref 11.5–14.5)
WBC: 12.6 10*3/uL — ABNORMAL HIGH (ref 3.8–10.6)

## 2016-08-15 LAB — CREATININE, SERUM: Creatinine, Ser: 0.94 mg/dL (ref 0.61–1.24)

## 2016-08-15 SURGERY — COLECTOMY, SIGMOID, OPEN
Anesthesia: General | Wound class: Clean Contaminated

## 2016-08-15 MED ORDER — MORPHINE SULFATE (PF) 4 MG/ML IV SOLN
2.0000 mg | INTRAVENOUS | Status: DC | PRN
  Administered 2016-08-15: 2 mg via INTRAVENOUS
  Filled 2016-08-15: qty 1

## 2016-08-15 MED ORDER — HEPARIN SODIUM (PORCINE) 5000 UNIT/ML IJ SOLN
5000.0000 [IU] | Freq: Three times a day (TID) | INTRAMUSCULAR | Status: DC
  Administered 2016-08-15 – 2016-08-20 (×13): 5000 [IU] via SUBCUTANEOUS
  Filled 2016-08-15 (×13): qty 1

## 2016-08-15 MED ORDER — SUGAMMADEX SODIUM 200 MG/2ML IV SOLN
INTRAVENOUS | Status: DC | PRN
  Administered 2016-08-15: 200 mg via INTRAVENOUS

## 2016-08-15 MED ORDER — OXYCODONE HCL 5 MG PO TABS: 5.0000 mg | ORAL_TABLET | Freq: Once | ORAL | Status: DC | PRN

## 2016-08-15 MED ORDER — DIPHENHYDRAMINE HCL 50 MG/ML IJ SOLN: 12.5000 mg | Freq: Four times a day (QID) | INTRAMUSCULAR | Status: DC | PRN

## 2016-08-15 MED ORDER — MEPERIDINE HCL 25 MG/ML IJ SOLN
6.2500 mg | INTRAMUSCULAR | Status: DC | PRN
  Administered 2016-08-15 (×2): 12.5 mg via INTRAVENOUS

## 2016-08-15 MED ORDER — CELECOXIB 200 MG PO CAPS
400.0000 mg | ORAL_CAPSULE | ORAL | Status: AC
  Administered 2016-08-15: 400 mg via ORAL

## 2016-08-15 MED ORDER — ONDANSETRON HCL 4 MG PO TABS: 4.0000 mg | ORAL_TABLET | Freq: Four times a day (QID) | ORAL | Status: DC | PRN

## 2016-08-15 MED ORDER — DIPHENHYDRAMINE HCL 12.5 MG/5ML PO ELIX: 12.5000 mg | ORAL_SOLUTION | Freq: Four times a day (QID) | ORAL | Status: DC | PRN

## 2016-08-15 MED ORDER — CELECOXIB 200 MG PO CAPS
ORAL_CAPSULE | ORAL | Status: AC
  Administered 2016-08-15: 400 mg via ORAL
  Filled 2016-08-15: qty 2

## 2016-08-15 MED ORDER — HEPARIN SODIUM (PORCINE) 5000 UNIT/ML IJ SOLN
INTRAMUSCULAR | Status: AC
  Administered 2016-08-15: 5000 [IU] via SUBCUTANEOUS
  Filled 2016-08-15: qty 1

## 2016-08-15 MED ORDER — PANTOPRAZOLE SODIUM 40 MG IV SOLR
40.0000 mg | Freq: Two times a day (BID) | INTRAVENOUS | Status: DC
  Administered 2016-08-15 – 2016-08-20 (×10): 40 mg via INTRAVENOUS
  Filled 2016-08-15 (×10): qty 40

## 2016-08-15 MED ORDER — LACTATED RINGERS IV SOLN
INTRAVENOUS | Status: DC | PRN
  Administered 2016-08-15 (×2): via INTRAVENOUS

## 2016-08-15 MED ORDER — FENTANYL CITRATE (PF) 100 MCG/2ML IJ SOLN
INTRAMUSCULAR | Status: AC
Start: 2016-08-15 — End: 2016-08-16
  Filled 2016-08-15: qty 2

## 2016-08-15 MED ORDER — ONDANSETRON HCL 4 MG/2ML IJ SOLN
INTRAMUSCULAR | Status: DC | PRN
  Administered 2016-08-15: 4 mg via INTRAVENOUS

## 2016-08-15 MED ORDER — NALOXONE HCL 0.4 MG/ML IJ SOLN: 0.4000 mg | INTRAMUSCULAR | Status: DC | PRN

## 2016-08-15 MED ORDER — CHLORHEXIDINE GLUCONATE CLOTH 2 % EX PADS: 6.0000 | MEDICATED_PAD | Freq: Once | CUTANEOUS | Status: DC

## 2016-08-15 MED ORDER — PROPOFOL 10 MG/ML IV BOLUS
INTRAVENOUS | Status: DC | PRN
  Administered 2016-08-15: 50 mg via INTRAVENOUS
  Administered 2016-08-15: 180 mg via INTRAVENOUS

## 2016-08-15 MED ORDER — LACTATED RINGERS IV SOLN
INTRAVENOUS | Status: DC
  Administered 2016-08-15 – 2016-08-19 (×9): via INTRAVENOUS

## 2016-08-15 MED ORDER — BUPIVACAINE-EPINEPHRINE (PF) 0.25% -1:200000 IJ SOLN
INTRAMUSCULAR | Status: AC
  Filled 2016-08-15: qty 30

## 2016-08-15 MED ORDER — MEPERIDINE HCL 25 MG/ML IJ SOLN
INTRAMUSCULAR | Status: AC
  Filled 2016-08-15: qty 1

## 2016-08-15 MED ORDER — MIDAZOLAM HCL 2 MG/2ML IJ SOLN
INTRAMUSCULAR | Status: DC | PRN
  Administered 2016-08-15 (×2): 2 mg via INTRAVENOUS

## 2016-08-15 MED ORDER — LACTATED RINGERS IV SOLN
INTRAVENOUS | Status: DC
  Administered 2016-08-15: 09:00:00 via INTRAVENOUS

## 2016-08-15 MED ORDER — HYDROMORPHONE HCL 1 MG/ML IJ SOLN
0.2500 mg | INTRAMUSCULAR | Status: DC | PRN
  Administered 2016-08-15 (×2): 0.5 mg via INTRAVENOUS

## 2016-08-15 MED ORDER — FENTANYL CITRATE (PF) 100 MCG/2ML IJ SOLN
INTRAMUSCULAR | Status: AC
  Filled 2016-08-15: qty 2

## 2016-08-15 MED ORDER — HYDROMORPHONE 1 MG/ML IV SOLN
INTRAVENOUS | Status: DC
  Administered 2016-08-15: 14:00:00 via INTRAVENOUS
  Administered 2016-08-15: 1.5 mg via INTRAVENOUS
  Administered 2016-08-16: 19:00:00 via INTRAVENOUS
  Administered 2016-08-16 (×2): 2.7 mg via INTRAVENOUS
  Filled 2016-08-15 (×2): qty 25

## 2016-08-15 MED ORDER — ONDANSETRON HCL 4 MG/2ML IJ SOLN
4.0000 mg | Freq: Four times a day (QID) | INTRAMUSCULAR | Status: DC | PRN
  Administered 2016-08-15 – 2016-08-17 (×3): 4 mg via INTRAVENOUS

## 2016-08-15 MED ORDER — BUPIVACAINE-EPINEPHRINE (PF) 0.25% -1:200000 IJ SOLN
INTRAMUSCULAR | Status: DC | PRN
  Administered 2016-08-15: 30 mL via PERINEURAL

## 2016-08-15 MED ORDER — HEPARIN SODIUM (PORCINE) 5000 UNIT/ML IJ SOLN
5000.0000 [IU] | Freq: Once | INTRAMUSCULAR | Status: AC
  Administered 2016-08-15: 5000 [IU] via SUBCUTANEOUS

## 2016-08-15 MED ORDER — DEXAMETHASONE SODIUM PHOSPHATE 10 MG/ML IJ SOLN
INTRAMUSCULAR | Status: DC | PRN
  Administered 2016-08-15: 4 mg via INTRAVENOUS

## 2016-08-15 MED ORDER — PROMETHAZINE HCL 25 MG/ML IJ SOLN: 6.2500 mg | INTRAMUSCULAR | Status: DC | PRN

## 2016-08-15 MED ORDER — ONDANSETRON HCL 4 MG/2ML IJ SOLN
4.0000 mg | Freq: Four times a day (QID) | INTRAMUSCULAR | Status: DC | PRN
  Administered 2016-08-20: 4 mg via INTRAVENOUS
  Filled 2016-08-15 (×4): qty 2

## 2016-08-15 MED ORDER — OXYCODONE HCL 5 MG/5ML PO SOLN: 5.0000 mg | Freq: Once | ORAL | Status: DC | PRN

## 2016-08-15 MED ORDER — FENTANYL CITRATE (PF) 100 MCG/2ML IJ SOLN
25.0000 ug | INTRAMUSCULAR | Status: DC | PRN
  Administered 2016-08-15 (×3): 50 ug via INTRAVENOUS

## 2016-08-15 MED ORDER — ROCURONIUM BROMIDE 100 MG/10ML IV SOLN
INTRAVENOUS | Status: DC | PRN
  Administered 2016-08-15: 20 mg via INTRAVENOUS
  Administered 2016-08-15: 50 mg via INTRAVENOUS
  Administered 2016-08-15: 20 mg via INTRAVENOUS
  Administered 2016-08-15: 10 mg via INTRAVENOUS

## 2016-08-15 MED ORDER — SODIUM CHLORIDE 0.9% FLUSH: 9.0000 mL | INTRAVENOUS | Status: DC | PRN

## 2016-08-15 MED ORDER — ESMOLOL HCL 100 MG/10ML IV SOLN
INTRAVENOUS | Status: DC | PRN
  Administered 2016-08-15 (×2): 20 mg via INTRAVENOUS

## 2016-08-15 MED ORDER — HYDROMORPHONE HCL 1 MG/ML IJ SOLN
INTRAMUSCULAR | Status: AC
  Administered 2016-08-15: 0.5 mg via INTRAVENOUS
  Filled 2016-08-15: qty 1

## 2016-08-15 MED ORDER — FENTANYL CITRATE (PF) 100 MCG/2ML IJ SOLN
INTRAMUSCULAR | Status: DC | PRN
  Administered 2016-08-15: 50 ug via INTRAVENOUS
  Administered 2016-08-15: 100 ug via INTRAVENOUS
  Administered 2016-08-15: 50 ug via INTRAVENOUS
  Administered 2016-08-15: 100 ug via INTRAVENOUS

## 2016-08-15 MED ORDER — SODIUM CHLORIDE 0.9 % IV SOLN
1.0000 g | INTRAVENOUS | Status: AC
  Administered 2016-08-15: 1 g via INTRAVENOUS
  Filled 2016-08-15: qty 1

## 2016-08-15 SURGICAL SUPPLY — 51 items
ADHESIVE MASTISOL STRL (MISCELLANEOUS) ×3 IMPLANT
APPLIER CLIP 13 LRG OPEN (CLIP) ×6
CANISTER SUCT 1200ML W/VALVE (MISCELLANEOUS) ×3 IMPLANT
CATH TRAY 16F METER LATEX (MISCELLANEOUS) ×3 IMPLANT
CHLORAPREP W/TINT 26ML (MISCELLANEOUS) ×3 IMPLANT
CLIP APPLIE 13 LRG OPEN (CLIP) ×2 IMPLANT
CLOSURE WOUND 1/2 X4 (GAUZE/BANDAGES/DRESSINGS) ×4
DRAPE LAPAROTOMY 100X77 ABD (DRAPES) ×3 IMPLANT
DRESSING TELFA 4X3 1S ST N-ADH (GAUZE/BANDAGES/DRESSINGS) ×3 IMPLANT
DRSG OPSITE POSTOP 3X4 (GAUZE/BANDAGES/DRESSINGS) ×3 IMPLANT
DRSG OPSITE POSTOP 4X10 (GAUZE/BANDAGES/DRESSINGS) ×3 IMPLANT
DRSG OPSITE POSTOP 4X8 (GAUZE/BANDAGES/DRESSINGS) ×3 IMPLANT
DRSG TELFA 3X8 NADH (GAUZE/BANDAGES/DRESSINGS) ×3 IMPLANT
ELECT BLADE 6.5 EXT (BLADE) ×3 IMPLANT
ELECT CAUTERY BLADE 6.4 (BLADE) ×3 IMPLANT
ELECT REM PT RETURN 9FT ADLT (ELECTROSURGICAL) ×3
ELECTRODE REM PT RTRN 9FT ADLT (ELECTROSURGICAL) ×1 IMPLANT
GAUZE SPONGE 4X4 12PLY STRL (GAUZE/BANDAGES/DRESSINGS) ×3 IMPLANT
GLOVE BIO SURGEON STRL SZ8 (GLOVE) ×18 IMPLANT
GLOVE INDICATOR 8.0 STRL GRN (GLOVE) ×18 IMPLANT
GOWN STRL REUS W/ TWL LRG LVL3 (GOWN DISPOSABLE) ×4 IMPLANT
GOWN STRL REUS W/ TWL XL LVL3 (GOWN DISPOSABLE) ×4 IMPLANT
GOWN STRL REUS W/TWL LRG LVL3 (GOWN DISPOSABLE) ×8
GOWN STRL REUS W/TWL XL LVL3 (GOWN DISPOSABLE) ×8
KIT RM TURNOVER STRD PROC AR (KITS) ×3 IMPLANT
LABEL OR SOLS (LABEL) ×3 IMPLANT
NDL SAFETY 22GX1.5 (NEEDLE) ×3 IMPLANT
NS IRRIG 1000ML POUR BTL (IV SOLUTION) ×3 IMPLANT
PACK BASIN MAJOR ARMC (MISCELLANEOUS) ×3 IMPLANT
PACK COLON CLEAN CLOSURE (MISCELLANEOUS) ×3 IMPLANT
RELOAD PROXIMATE 30MM BLUE (ENDOMECHANICALS) ×6 IMPLANT
RELOAD STAPLER LINEAR PROX 30 (STAPLE) ×2 IMPLANT
SEPRAFILM MEMBRANE 5X6 (MISCELLANEOUS) ×3 IMPLANT
SET YANKAUER POOLE SUCT (MISCELLANEOUS) ×3 IMPLANT
SPONGE LAP 18X18 5 PK (GAUZE/BANDAGES/DRESSINGS) ×9 IMPLANT
STAPLER GUN LINEAR PROX 60 (STAPLE) ×3 IMPLANT
STAPLER PROXIMATE 75MM BLUE (STAPLE) ×6 IMPLANT
STAPLER RELOAD LINEAR PROX 30 (STAPLE) ×6
STAPLER SKIN PROX 35W (STAPLE) ×3 IMPLANT
STRIP CLOSURE SKIN 1/2X4 (GAUZE/BANDAGES/DRESSINGS) ×8 IMPLANT
SUT MNCRL 3-0 UNDYED SH (SUTURE) ×2 IMPLANT
SUT MONOCRYL 3-0 UNDYED (SUTURE) ×4
SUT PDS AB 1 CT1 27 (SUTURE) ×3 IMPLANT
SUT PDS AB 1 TP1 54 (SUTURE) ×12 IMPLANT
SUT SILK 0 (SUTURE) ×8
SUT SILK 0 30XBRD TIE 6 (SUTURE) ×4 IMPLANT
SUT SILK 3-0 (SUTURE) ×15 IMPLANT
SUT VIC AB 1 CTX 27 (SUTURE) ×9 IMPLANT
SUT VICRYL 0 TIES 12 18 (SUTURE) ×6 IMPLANT
SYRINGE 10CC LL (SYRINGE) ×3 IMPLANT
TAPE CLOTH SOFT 2X10 (GAUZE/BANDAGES/DRESSINGS) ×3 IMPLANT

## 2016-08-15 NOTE — Anesthesia Preprocedure Evaluation (Signed)
Anesthesia Evaluation  Patient identified by MRN, date of birth, ID band Patient awake    Reviewed: Allergy & Precautions, NPO status , Patient's Chart, lab work & pertinent test results  History of Anesthesia Complications Negative for: history of anesthetic complications  Airway Mallampati: II  TM Distance: >3 FB Neck ROM: Full    Dental  (+) Chipped,    Pulmonary neg sleep apnea, neg COPD, Current Smoker (quit 1 week ago),    breath sounds clear to auscultation- rhonchi (-) wheezing      Cardiovascular Exercise Tolerance: Good (-) hypertension(-) CAD and (-) Past MI  Rhythm:Regular Rate:Normal - Systolic murmurs and - Diastolic murmurs    Neuro/Psych Anxiety negative neurological ROS     GI/Hepatic Neg liver ROS, GERD  ,  Endo/Other  negative endocrine ROSneg diabetes  Renal/GU negative Renal ROS     Musculoskeletal negative musculoskeletal ROS (+)   Abdominal (+) - obese,   Peds  Hematology negative hematology ROS (+)   Anesthesia Other Findings Past Medical History: No date: Anxiety No date: Diverticulitis No date: GERD (gastroesophageal reflux disease)   Reproductive/Obstetrics                             Anesthesia Physical Anesthesia Plan  ASA: II  Anesthesia Plan: General   Post-op Pain Management:    Induction: Intravenous  Airway Management Planned: Oral ETT  Additional Equipment:   Intra-op Plan:   Post-operative Plan: Extubation in OR  Informed Consent: I have reviewed the patients History and Physical, chart, labs and discussed the procedure including the risks, benefits and alternatives for the proposed anesthesia with the patient or authorized representative who has indicated his/her understanding and acceptance.   Dental advisory given  Plan Discussed with: CRNA and Anesthesiologist  Anesthesia Plan Comments:         Anesthesia Quick  Evaluation

## 2016-08-15 NOTE — Anesthesia Procedure Notes (Signed)
Procedure Name: Intubation Date/Time: 08/15/2016 9:27 AM Performed by: Edyth GunnelsGILBERT, Daysie Helf Pre-anesthesia Checklist: Patient identified Patient Re-evaluated:Patient Re-evaluated prior to inductionOxygen Delivery Method: Circle system utilized Preoxygenation: Pre-oxygenation with 100% oxygen Intubation Type: IV induction Ventilation: Mask ventilation without difficulty Laryngoscope Size: Mac and 3 Grade View: Grade II Tube type: Oral Tube size: 7.5 mm Number of attempts: 1 Airway Equipment and Method: Stylet Placement Confirmation: ETT inserted through vocal cords under direct vision,  positive ETCO2 and breath sounds checked- equal and bilateral Secured at: 22 cm Tube secured with: Tape Dental Injury: Teeth and Oropharynx as per pre-operative assessment

## 2016-08-15 NOTE — Anesthesia Postprocedure Evaluation (Signed)
Anesthesia Post Note  Patient: Leilani AbleJoshua A Braddy  Procedure(s) Performed: Procedure(s) (LRB): COLON RESECTION SIGMOID (N/A)  Patient location during evaluation: PACU Anesthesia Type: General Level of consciousness: awake and alert and oriented Pain management: pain level controlled Vital Signs Assessment: post-procedure vital signs reviewed and stable Respiratory status: spontaneous breathing, nonlabored ventilation and respiratory function stable Cardiovascular status: blood pressure returned to baseline and stable Postop Assessment: no signs of nausea or vomiting Anesthetic complications: no    Last Vitals:  Vitals:   08/15/16 1245 08/15/16 1303  BP: (!) 149/109 (!) 154/106  Pulse: 78 86  Resp: 16 14  Temp:  36.4 C    Last Pain:  Vitals:   08/15/16 1303  TempSrc:   PainSc: Asleep                 Nautika Cressey

## 2016-08-15 NOTE — Progress Notes (Signed)
Preoperative Review   Patient is met in the preoperative holding area. The history is reviewed in the chart and with the patient. I personally reviewed the options and rationale as well as the risks of this procedure that have been previously discussed with the patient. All questions asked by the patient and/or family were answered to their satisfaction.  We discussed specifically the procedure and the potential risk of a colostomy and transfusion at [facility]. Patient questions concerning HIV testing. I will research consent issues and assist him in obtaining proper HIV consenting in this hospitalization  Patient agrees to proceed with this procedure at this time.  Florene Glen M.D. FACS

## 2016-08-15 NOTE — Transfer of Care (Signed)
Immediate Anesthesia Transfer of Care Note  Patient: Matthew Chaney  Procedure(s) Performed: Procedure(s): COLON RESECTION SIGMOID (N/A)  Patient Location: PACU  Anesthesia Type:General  Level of Consciousness: oriented and sedated  Airway & Oxygen Therapy: Patient Spontanous Breathing and Patient connected to face mask oxygen  Post-op Assessment: Report given to RN and Post -op Vital signs reviewed and stable  Post vital signs: Reviewed and stable  Last Vitals:  Vitals:   08/15/16 0808 08/15/16 0911  BP: (!) 143/104   Pulse: 76   Resp: 16   Temp: 37.1 C 36.9 C    Last Pain:  Vitals:   08/15/16 0911  TempSrc: Tympanic         Complications: No apparent anesthesia complications

## 2016-08-15 NOTE — Op Note (Signed)
Pre-operative Diagnosis: History of diverticulitis  Post-operative Diagnosis: Diverticulosis  Surgeon: Dionne Miloichard Louay Myrie   Assistants: Westley Gamblesim Oaks  Surgeon: Adah Salvageichard E. Excell Seltzerooper, MD FACS  Anesthesia: Gen. with endotracheal tube  Procedure: Exploratory laparotomy, sigmoid and left hemicolectomy's, takedown of splenic flexure.  Procedure Details  The patient was seen again in the Holding Room. The benefits, complications, treatment options, and expected outcomes were discussed with the patient. The risks of bleeding, infection, recurrence of symptoms, failure to resolve symptoms,  bowel injury, any of which could require further surgery were reviewed with the patient.   The patient was taken to Operating Room, identified as Matthew Chaney and the procedure verified.  A Time Out was held and the above information confirmed.  Prior to the induction of general anesthesia, antibiotic prophylaxis was administered. VTE prophylaxis was in place. General endotracheal anesthesia was then administered and tolerated well. After the induction, the abdomen was prepped with Chloraprep and draped in the sterile fashion. The patient was positioned in the supine position.  A Foley catheter had been placed.  A midline incision was utilized to open and explore the abdominal cavity and an area of acute diverticulitis had been identified at the rectosigmoid junction this was acute on chronic with no sign of perforation. However there was a segment of small bowel densely adherent to this and as this was taken down it was noted to be densely adherent but not full-thickness suggestive of early entero-colonic fistula formation. The serosa of the small bowel was inverted with 3-0 silk Lembert sutures. A separate area of small bowel was adherent as well and that serosa was taken down and covered with Lembert sutures as well no full-thickness entry of the small bowel was encountered.  The avascular line was taken down  laterally on the left and the left ureter was identified and kept in view. A site for division distal to the area of acute and chronic diverticulitis was identified and a TA stapler was fired in this area this allowed for elevation of the sigmoid colon into the wound and further dissection demonstrated that the area of acute and chronic diverticulitis was short however there was extensive and frequent signs of diverticulosis all the way up towards the splenic flexure the lateral extent of dissection was continued up to the splenic flexure and the splenic flexure was taken down with blunt and electrocautery dissection some small vessels were doubly clipped and divided. This dissection continued along the transverse colon to allow for adequate length for an anastomosis. A site was chosen above the area of diverticulosis along the left colon which was divided with a GIA stapling device.  Appropriate time was utilized to ensure that there was no sign of ischemia the bowel appeared and continued to appear viable the mesentery vessels had been divided between clamps and tied or doubly tied with 0 silk ligatures and the specimen had been sent off for examination. Further evaluation of the distal segment of the bowel demonstrated no sign of ischemia. A bowel clamp was placed and the staple line was removed sharply without electrocautery and there was good bleeding at the edges of good arterial blood. The same was performed on the rectum. Then a triangulated TA stapling technique was utilized to approximate the proximal bowel to the rectum at the rectosigmoid junction. This was reinforced with 3-0 silk Lembert type sutures and appeared to be patent without signs of obvious leak. Hemostasis had been maintained at this point.  The area was irrigated with  copious amounts normal saline and checked for any bleeding no bleeding was identified.  The omentum was placed over the small bowel.  Clean closure technique was then  utilized. Gowns gloves and drapes were placed and a piece of Seprafilm was placed over the omentum and the wound was closed with running #1 PDS the subcutaneous tissues tissues was infiltrated with 30 cc of Marcaine with epinephrine and then irrigated and skin staples were placed a sterile dressing was placed  Patient out of this procedure well the workup occasions the sponge lap needle count was correct on both the primary case and the clean closure section and the estimated blood loss was 250 cc. Patient was taken to recovery room in stable condition to be admitted for continued care  Findings: Extensive diverticulosis well up to the splenic flexure with signs of previous acute diverticulitis at the rectosigmoid junction's. Signs of early entero-colinic fistula formation   Estimated Blood Loss: 250 cc         Drains: None         Specimens: Left and sigmoid colon         Complications: None                  Condition: Stable   Nollan Muldrow E. Excell Seltzerooper, MD, FACS

## 2016-08-15 NOTE — Progress Notes (Signed)
Postop check Patient feels well moderate pain using his PCA I'll signs are stable abdominal binder not yet on. Nontender calves  Patient doing well discussed with family and patient continue current care

## 2016-08-16 LAB — BASIC METABOLIC PANEL
ANION GAP: 8 (ref 5–15)
BUN: 9 mg/dL (ref 6–20)
CALCIUM: 8.6 mg/dL — AB (ref 8.9–10.3)
CO2: 26 mmol/L (ref 22–32)
Chloride: 101 mmol/L (ref 101–111)
Creatinine, Ser: 0.89 mg/dL (ref 0.61–1.24)
Glucose, Bld: 117 mg/dL — ABNORMAL HIGH (ref 65–99)
Potassium: 3.8 mmol/L (ref 3.5–5.1)
SODIUM: 135 mmol/L (ref 135–145)

## 2016-08-16 LAB — CBC
HCT: 42.9 % (ref 40.0–52.0)
HEMOGLOBIN: 14.9 g/dL (ref 13.0–18.0)
MCH: 29.4 pg (ref 26.0–34.0)
MCHC: 34.8 g/dL (ref 32.0–36.0)
MCV: 84.5 fL (ref 80.0–100.0)
Platelets: 206 10*3/uL (ref 150–440)
RBC: 5.08 MIL/uL (ref 4.40–5.90)
RDW: 15.5 % — ABNORMAL HIGH (ref 11.5–14.5)
WBC: 6.9 10*3/uL (ref 3.8–10.6)

## 2016-08-16 MED ORDER — HYDROMORPHONE 1 MG/ML IV SOLN
INTRAVENOUS | Status: DC
  Administered 2016-08-16: 1.8 mg via INTRAVENOUS
  Administered 2016-08-16: 0.6 mg via INTRAVENOUS
  Administered 2016-08-17: 3.6 mg via INTRAVENOUS
  Administered 2016-08-17: 2.4 mg via INTRAVENOUS
  Administered 2016-08-17: 2.1 mg via INTRAVENOUS
  Administered 2016-08-17: 1.2 mg via INTRAVENOUS
  Administered 2016-08-17: 2.1 mg via INTRAVENOUS
  Administered 2016-08-17: 1.5 mg via INTRAVENOUS
  Administered 2016-08-18: 3 mg via INTRAVENOUS
  Administered 2016-08-18: 2.7 mg via INTRAVENOUS
  Administered 2016-08-18: 2.4 mg via INTRAVENOUS
  Administered 2016-08-18: 1.8 mg via INTRAVENOUS
  Administered 2016-08-18: 0.9 mg via INTRAVENOUS
  Administered 2016-08-19: 2.4 mg via INTRAVENOUS
  Administered 2016-08-19: 1.2 mg via INTRAVENOUS
  Administered 2016-08-19: 0.6 mg via INTRAVENOUS
  Administered 2016-08-19: 1.2 mg via INTRAVENOUS
  Filled 2016-08-16: qty 25

## 2016-08-16 MED ORDER — MORPHINE SULFATE (PF) 4 MG/ML IV SOLN
2.0000 mg | INTRAVENOUS | Status: DC | PRN
  Administered 2016-08-16 (×2): 4 mg via INTRAVENOUS
  Administered 2016-08-16 – 2016-08-18 (×6): 2 mg via INTRAVENOUS
  Administered 2016-08-19 – 2016-08-20 (×4): 4 mg via INTRAVENOUS
  Filled 2016-08-16 (×13): qty 1

## 2016-08-16 NOTE — Progress Notes (Signed)
Night shift informed me that at midnight patients diet order changed to NPO in epic on its own.  I informed Dr Excell Seltzerooper of this and he said that the pt should be on clear liquid diet.  Order changed

## 2016-08-16 NOTE — Progress Notes (Signed)
1 Day Post-Op  Subjective: Visual moderate incisional pain he thinks that the PCA is not helping very much. 0 vomiting  Objective: Vital signs in last 24 hours: Temp:  [97.2 F (36.2 C)-98.8 F (37.1 C)] 98.5 F (36.9 C) (11/22 0521) Pulse Rate:  [75-100] 100 (11/22 0521) Resp:  [11-17] 12 (11/22 0800) BP: (137-156)/(99-111) 137/99 (11/22 0521) SpO2:  [92 %-100 %] 98 % (11/22 0800) Last BM Date: 08/15/16  Intake/Output from previous day: 11/21 0701 - 11/22 0700 In: 9231.1 [I.V.:9231.1] Out: 1500 [Urine:1200; Blood:300] Intake/Output this shift: No intake/output data recorded.  Physical exam:  Wound is clean no erythema or drainage patient appears more awake now than he was yesterday.  Lab Results: CBC   Recent Labs  08/15/16 1335 08/16/16 0502  WBC 12.6* 6.9  HGB 15.1 14.9  HCT 44.4 42.9  PLT 248 206   BMET  Recent Labs  08/15/16 1335 08/16/16 0502  NA  --  135  K  --  3.8  CL  --  101  CO2  --  26  GLUCOSE  --  117*  BUN  --  9  CREATININE 0.94 0.89  CALCIUM  --  8.6*   PT/INR No results for input(s): LABPROT, INR in the last 72 hours. ABG No results for input(s): PHART, HCO3 in the last 72 hours.  Invalid input(s): PCO2, PO2  Studies/Results: No results found.  Anti-infectives: Anti-infectives    Start     Dose/Rate Route Frequency Ordered Stop   08/15/16 0340  ertapenem (INVANZ) 1 g in sodium chloride 0.9 % 50 mL IVPB     1 g 100 mL/hr over 30 Minutes Intravenous On call to O.R. 08/15/16 0340 08/15/16 0940      Assessment/Plan: s/p Procedure(s): COLON RESECTION SIGMOID   Labs reviewed. Patient doing very well would consider increasing the breakthrough pain medication if he continues to have a otherwise would stay on the PCA present levels.  [name] E Ainsley Deakins, MD, FACS  08/16/2016

## 2016-08-16 NOTE — Progress Notes (Signed)
Dr Excell Seltzerooper notified that pt was requesting something oral for pain.  He did not want to do that at this time.  Morphine dose increased

## 2016-08-16 NOTE — Progress Notes (Signed)
Orders in place for pt to be NPO after midnight. Called to ask if pt can continue with clear liquids, pt went to OR today and I am unaware of any additional procedures scheduled for 11/22. Dr. Orvis BrillLoflin said she does not know plan of care, keep pt NPO tonight per MD order.

## 2016-08-17 LAB — HIV ANTIBODY (ROUTINE TESTING W REFLEX): HIV SCREEN 4TH GENERATION: NONREACTIVE

## 2016-08-17 LAB — CBC WITH DIFFERENTIAL/PLATELET
BASOS ABS: 0 10*3/uL (ref 0–0.1)
BASOS PCT: 0 %
EOS PCT: 0 %
Eosinophils Absolute: 0 10*3/uL (ref 0–0.7)
HEMATOCRIT: 38.7 % — AB (ref 40.0–52.0)
Hemoglobin: 13.2 g/dL (ref 13.0–18.0)
LYMPHS PCT: 15 %
Lymphs Abs: 1 10*3/uL (ref 1.0–3.6)
MCH: 28.9 pg (ref 26.0–34.0)
MCHC: 34.2 g/dL (ref 32.0–36.0)
MCV: 84.5 fL (ref 80.0–100.0)
Monocytes Absolute: 1 10*3/uL (ref 0.2–1.0)
Monocytes Relative: 15 %
NEUTROS ABS: 4.4 10*3/uL (ref 1.4–6.5)
Neutrophils Relative %: 70 %
PLATELETS: 175 10*3/uL (ref 150–440)
RBC: 4.58 MIL/uL (ref 4.40–5.90)
RDW: 15 % — ABNORMAL HIGH (ref 11.5–14.5)
WBC: 6.4 10*3/uL (ref 3.8–10.6)

## 2016-08-17 LAB — BASIC METABOLIC PANEL
ANION GAP: 6 (ref 5–15)
BUN: 5 mg/dL — ABNORMAL LOW (ref 6–20)
CHLORIDE: 100 mmol/L — AB (ref 101–111)
CO2: 28 mmol/L (ref 22–32)
Calcium: 8.4 mg/dL — ABNORMAL LOW (ref 8.9–10.3)
Creatinine, Ser: 0.89 mg/dL (ref 0.61–1.24)
GFR calc Af Amer: >60 mL/min (ref >60)
Glucose, Bld: 115 mg/dL — ABNORMAL HIGH (ref 65–99)
POTASSIUM: 3.6 mmol/L (ref 3.5–5.1)
SODIUM: 134 mmol/L — AB (ref 135–145)

## 2016-08-17 NOTE — Progress Notes (Signed)
Dr Excell Seltzerooper requested that IVF be turned down to 14600ml/hr

## 2016-08-17 NOTE — Progress Notes (Signed)
2 Days Post-Op  Subjective: Status post extended left and sigmoid colectomy. Patient doing well but not passing any gas yet he is pain is better controlled today  Objective: Vital signs in last 24 hours: Temp:  [98.6 F (37 C)-99.6 F (37.6 C)] 98.7 F (37.1 C) (11/23 0603) Pulse Rate:  [95-108] 108 (11/23 0603) Resp:  [13-20] 18 (11/23 0603) BP: (123-139)/(76-96) 133/96 (11/23 0603) SpO2:  [94 %-98 %] 95 % (11/23 0603) Last BM Date: 08/14/16  Intake/Output from previous day: 11/22 0701 - 11/23 0700 In: 3097.8 [I.V.:3097.8] Out: 2200 [Urine:2200] Intake/Output this shift: No intake/output data recorded.  Physical exam:  Abdomen is soft slightly distended nontender wound is clean no erythema no drainage calves are nontender  Lab Results: CBC   Recent Labs  08/16/16 0502 08/17/16 0449  WBC 6.9 6.4  HGB 14.9 13.2  HCT 42.9 38.7*  PLT 206 175   BMET  Recent Labs  08/16/16 0502 08/17/16 0449  NA 135 134*  K 3.8 3.6  CL 101 100*  CO2 26 28  GLUCOSE 117* 115*  BUN 9 5*  CREATININE 0.89 0.89  CALCIUM 8.6* 8.4*   PT/INR No results for input(s): LABPROT, INR in the last 72 hours. ABG No results for input(s): PHART, HCO3 in the last 72 hours.  Invalid input(s): PCO2, PO2  Studies/Results: No results found.  Anti-infectives: Anti-infectives    Start     Dose/Rate Route Frequency Ordered Stop   08/15/16 0340  ertapenem (INVANZ) 1 g in sodium chloride 0.9 % 50 mL IVPB     1 g 100 mL/hr over 30 Minutes Intravenous On call to O.R. 08/15/16 0340 08/15/16 0940      Assessment/Plan: s/p Procedure(s): COLON RESECTION SIGMOID   Patient doing quite well and pain is better controlled now will discontinue Foley catheter but continue IV fluids for now. Volume has been decreased.  [name] E Leniya Breit, MD, FACS  08/17/2016

## 2016-08-18 LAB — CBC WITH DIFFERENTIAL/PLATELET
BASOS ABS: 0 10*3/uL (ref 0–0.1)
Basophils Relative: 1 %
EOS PCT: 1 %
Eosinophils Absolute: 0.1 10*3/uL (ref 0–0.7)
HEMATOCRIT: 37.4 % — AB (ref 40.0–52.0)
Hemoglobin: 13.2 g/dL (ref 13.0–18.0)
LYMPHS PCT: 14 %
Lymphs Abs: 1 10*3/uL (ref 1.0–3.6)
MCH: 29.9 pg (ref 26.0–34.0)
MCHC: 35.2 g/dL (ref 32.0–36.0)
MCV: 85 fL (ref 80.0–100.0)
Monocytes Absolute: 1 10*3/uL (ref 0.2–1.0)
Monocytes Relative: 13 %
NEUTROS ABS: 5.2 10*3/uL (ref 1.4–6.5)
NEUTROS PCT: 71 %
PLATELETS: 192 10*3/uL (ref 150–440)
RBC: 4.4 MIL/uL (ref 4.40–5.90)
RDW: 14.9 % — ABNORMAL HIGH (ref 11.5–14.5)
WBC: 7.3 10*3/uL (ref 3.8–10.6)

## 2016-08-18 LAB — BASIC METABOLIC PANEL
ANION GAP: 10 (ref 5–15)
BUN: 8 mg/dL (ref 6–20)
CO2: 26 mmol/L (ref 22–32)
Calcium: 8.7 mg/dL — ABNORMAL LOW (ref 8.9–10.3)
Chloride: 100 mmol/L — ABNORMAL LOW (ref 101–111)
Creatinine, Ser: 0.77 mg/dL (ref 0.61–1.24)
GFR calc Af Amer: >60 mL/min (ref >60)
GLUCOSE: 98 mg/dL (ref 65–99)
POTASSIUM: 3.7 mmol/L (ref 3.5–5.1)
Sodium: 136 mmol/L (ref 135–145)

## 2016-08-18 MED ORDER — TRAMADOL HCL 50 MG PO TABS
50.0000 mg | ORAL_TABLET | Freq: Four times a day (QID) | ORAL | Status: DC | PRN
  Administered 2016-08-19: 50 mg via ORAL
  Filled 2016-08-18: qty 1

## 2016-08-18 NOTE — Progress Notes (Signed)
Pt BP 132/97. Primary nurse notified Dr. Orvis BrillLoflin. No new orders. Primary nurse to continue to monitor.

## 2016-08-18 NOTE — Progress Notes (Signed)
3 Days Post-Op  Subjective: Minimal abdominal pain. Patient overall feeling better he has passed some gas.  Objective: Vital signs in last 24 hours: Temp:  [97.9 F (36.6 C)-99.1 F (37.3 C)] 98.7 F (37.1 C) (11/24 0510) Pulse Rate:  [98-111] 98 (11/24 0635) Resp:  [12-18] 18 (11/24 0510) BP: (124-142)/(90-107) 132/97 (11/24 0649) SpO2:  [92 %-100 %] 100 % (11/24 0635) Last BM Date: 08/15/16  Intake/Output from previous day: 11/23 0701 - 11/24 0700 In: 4373.7 [P.O.:1440; I.V.:2933.7] Out: 925 [Urine:925] Intake/Output this shift: No intake/output data recorded.  Physical exam:  Distended abdomen and slightly tympanitic wound is clean nontender abdomen  Lab Results: CBC   Recent Labs  08/17/16 0449 08/18/16 0548  WBC 6.4 7.3  HGB 13.2 13.2  HCT 38.7* 37.4*  PLT 175 192   BMET  Recent Labs  08/17/16 0449 08/18/16 0548  NA 134* 136  K 3.6 3.7  CL 100* 100*  CO2 28 26  GLUCOSE 115* 98  BUN 5* 8  CREATININE 0.89 0.77  CALCIUM 8.4* 8.7*   PT/INR No results for input(s): LABPROT, INR in the last 72 hours. ABG No results for input(s): PHART, HCO3 in the last 72 hours.  Invalid input(s): PCO2, PO2  Studies/Results: No results found.  Anti-infectives: Anti-infectives    Start     Dose/Rate Route Frequency Ordered Stop   08/15/16 0340  ertapenem (INVANZ) 1 g in sodium chloride 0.9 % 50 mL IVPB     1 g 100 mL/hr over 30 Minutes Intravenous On call to O.R. 08/15/16 0340 08/15/16 0940      Assessment/Plan: s/p Procedure(s): COLON RESECTION SIGMOID   Labs are reviewed. We'll advance to full liquid diet today and decrease IV fluids.  [name] E Marvyn Torrez, MD, FACS  08/18/2016

## 2016-08-19 LAB — SURGICAL PATHOLOGY

## 2016-08-19 NOTE — Plan of Care (Signed)
Problem: Bowel/Gastric: Goal: Will not experience complications related to bowel motility Outcome: Progressing Still passing flatus, but no BM

## 2016-08-19 NOTE — Progress Notes (Signed)
4 Days Post-Op  Subjective: Patient feels well today tolerating a diet having minimal pain if any. His PCA much. Passing gas.  Objective: Vital signs in last 24 hours: Temp:  [98.1 F (36.7 C)-98.7 F (37.1 C)] 98.4 F (36.9 C) (11/25 0543) Pulse Rate:  [110-128] 112 (11/25 0543) Resp:  [13-20] 13 (11/25 0857) BP: (136-142)/(79-88) 142/88 (11/25 0543) SpO2:  [35 %-100 %] 98 % (11/25 0857) FiO2 (%):  [0 %] 0 % (11/25 0400) Last BM Date: 08/15/16  Intake/Output from previous day: 11/24 0701 - 11/25 0700 In: 2311.2 [P.O.:860; I.V.:1451.2] Out: 600 [Urine:600] Intake/Output this shift: Total I/O In: 261 [I.V.:261] Out: 0   Physical exam:  Abdomen is distended tympanitic but nontender wound is clean no erythema no drainage calves are nontender  Lab Results: CBC   Recent Labs  08/17/16 0449 08/18/16 0548  WBC 6.4 7.3  HGB 13.2 13.2  HCT 38.7* 37.4*  PLT 175 192   BMET  Recent Labs  08/17/16 0449 08/18/16 0548  NA 134* 136  K 3.6 3.7  CL 100* 100*  CO2 28 26  GLUCOSE 115* 98  BUN 5* 8  CREATININE 0.89 0.77  CALCIUM 8.4* 8.7*   PT/INR No results for input(s): LABPROT, INR in the last 72 hours. ABG No results for input(s): PHART, HCO3 in the last 72 hours.  Invalid input(s): PCO2, PO2  Studies/Results: No results found.  Anti-infectives: Anti-infectives    Start     Dose/Rate Route Frequency Ordered Stop   08/15/16 0340  ertapenem (INVANZ) 1 g in sodium chloride 0.9 % 50 mL IVPB     1 g 100 mL/hr over 30 Minutes Intravenous On call to O.R. 08/15/16 0340 08/15/16 0940      Assessment/Plan: s/p Procedure(s): COLON RESECTION SIGMOID   Patient doing quite well at this point we'll advance diet and DC PCA and saline lock. Probably home tomorrow.  [name] E Chenell Lozon, MD, FACS  08/19/2016

## 2016-08-20 MED ORDER — OXYCODONE-ACETAMINOPHEN 5-325 MG PO TABS: 1.0000 | ORAL_TABLET | ORAL | 0 refills | Status: DC | PRN

## 2016-08-20 NOTE — Progress Notes (Signed)
Per RN patient wants to be discharged this morning as opposed to this afternoon. He feels well and is tolerating a diet.

## 2016-08-20 NOTE — Discharge Summary (Signed)
Physician Discharge Summary  Patient ID: Leilani AbleJoshua A Haros MRN: [id number] DOB/AGE: Oct 09, 1980 35 y.o.  Admit date: 08/15/2016 Discharge date: 08/20/2016   Discharge Diagnoses:  Active Problems:   Diverticulosis large intestine w/o perforation or abscess w/o bleeding   Procedures:Extended colectomy  Hospital Course: This patient with a history of acute diverticulitis here for elective colon resection. Prior workup suggested a very extensive diverticulosis. He was taken the operating room where sigmoid colectomy and extended left hemicolectomy was performed. A primary anastomosis was performed. He tolerated a postoperative diet advancement and wants to be discharged at this point he will follow-up in our office in 10 days is given oral analgesics. He states he is allergic to Vicodin but has been able to take oral Percocet in the past without difficulty and prefers that. He will follow-up in my office in 10 days.  Consults: None  Disposition: 01-Home or Self Care     Medication List    TAKE these medications   oxyCODONE-acetaminophen 5-325 MG tablet Commonly known as:  ROXICET Take 1 tablet by mouth every 4 (four) hours as needed for moderate pain.        [name] E Cheryl Chay, MD, FACS

## 2016-08-20 NOTE — Progress Notes (Signed)
08/20/2016 12:22 PM  BP (!) 136/94 (BP Location: Right Arm)   Pulse (!) 114   Temp 98.2 F (36.8 C) (Oral)   Resp 18   Ht 5\' 9"  (1.753 m)   Wt 83.9 kg (185 lb)   SpO2 98%   BMI 27.32 kg/m  Patient discharged per MD orders. Discharge instructions reviewed with patient and patient verbalized understanding. IV removed per policy. Prescriptions discussed and given to patient. Discharged ambulatory.   Ron ParkerHerron, Tavarion Babington D, RN

## 2016-08-20 NOTE — Progress Notes (Signed)
Addendum  Discussed tramadol with patient and his allergy to Vicodin. He states that he can take Percocet and prefers Percocet. He been taking Percocet as an outpatient without any difficulty at all. Therefore on discharge she will be given Percocet as opposed tramadol. His request.

## 2016-08-20 NOTE — Progress Notes (Signed)
5 Days Post-Op  Subjective: Status post extended colectomy for severe diverticulosisand a history of diverticulitis. He has been tolerating a regular diet this morning he states that he vomited. When I questioned the amount of vomitus he states it was only when he took a simple water and then spit up that it of water therefore it was not true vomitus. A nausea since then is passing gas and having bowel movements and has minimal pain if any. In fact he did not take any pain medicine last night.  Objective: Vital signs in last 24 hours: Temp:  [98.1 F (36.7 C)-98.5 F (36.9 C)] 98.2 F (36.8 C) (11/26 0510) Pulse Rate:  [113-123] 114 (11/26 0510) Resp:  [18] 18 (11/26 0510) BP: (126-136)/(86-94) 136/94 (11/26 0510) SpO2:  [95 %-99 %] 98 % (11/26 0510) Last BM Date: 08/15/16  Intake/Output from previous day: 11/25 0701 - 11/26 0700 In: 441 [P.O.:180; I.V.:261] Out: 600 [Urine:600] Intake/Output this shift: No intake/output data recorded.  Physical exam:  Wound is clean abdomen is soft nondistended nontympanitic and nontender. Calves are nontender  Lab Results: CBC   Recent Labs  08/18/16 0548  WBC 7.3  HGB 13.2  HCT 37.4*  PLT 192   BMET  Recent Labs  08/18/16 0548  NA 136  K 3.7  CL 100*  CO2 26  GLUCOSE 98  BUN 8  CREATININE 0.77  CALCIUM 8.7*   PT/INR No results for input(s): LABPROT, INR in the last 72 hours. ABG No results for input(s): PHART, HCO3 in the last 72 hours.  Invalid input(s): PCO2, PO2  Studies/Results: No results found.  Anti-infectives: Anti-infectives    Start     Dose/Rate Route Frequency Ordered Stop   08/15/16 0340  ertapenem (INVANZ) 1 g in sodium chloride 0.9 % 50 mL IVPB     1 g 100 mL/hr over 30 Minutes Intravenous On call to O.R. 08/15/16 0340 08/15/16 0940      Assessment/Plan: s/p Procedure(s): COLON RESECTION SIGMOID   Patient doing quite well and not sure exactly why he is tachycardic but his abdominal exam and  his exam is much improved and he feels well and thinks he is ready to go home. The plan would be for him to go home on oral analgesics to follow up in my office at the end of the week. Instructions concerning showering etc. been given to him he understood and agreed with this plan.  [name] E Waylon Hershey, MD, FACS  08/20/2016

## 2016-08-22 ENCOUNTER — Telehealth: Payer: Self-pay | Admitting: Surgery

## 2016-08-22 MED ORDER — ONDANSETRON HCL 4 MG PO TABS: 4.0000 mg | ORAL_TABLET | Freq: Three times a day (TID) | ORAL | 0 refills | Status: DC | PRN

## 2016-08-22 NOTE — Telephone Encounter (Signed)
Spoke with Dr.Loflin at this time and Zofran was approved to call in for the patient.  Patient notified at this time.

## 2016-08-22 NOTE — Telephone Encounter (Signed)
Spoke with patient at this time. He had Colon Resection Sigmoid on 08/15/16 Dr.Cooper. He states he has vomited the last two nights and would like something for the nausea. He denies fever. Doing ok otherwise.

## 2016-08-22 NOTE — Telephone Encounter (Signed)
Patient has a post op Thursday but is very nauseated and needs something for it. Surgery was 11/21 with Dr. Excell Seltzerooper

## 2016-08-24 ENCOUNTER — Ambulatory Visit (INDEPENDENT_AMBULATORY_CARE_PROVIDER_SITE_OTHER): Payer: Self-pay | Admitting: Surgery

## 2016-08-24 ENCOUNTER — Encounter: Payer: Self-pay | Admitting: Surgery

## 2016-08-24 VITALS — BP 121/86 | HR 103 | Temp 98.3°F | Wt 179.0 lb

## 2016-08-24 DIAGNOSIS — K5792 Diverticulitis of intestine, part unspecified, without perforation or abscess without bleeding: Secondary | ICD-10-CM

## 2016-08-24 NOTE — Progress Notes (Signed)
Outpatient postop visit  08/24/2016  Matthew Chaney is an 35 y.o. male.    Procedure: Colon resection  CC: Gassiness  HPI: Patient describes gassiness and burping but is passing gas eating well having bowel movements and having no problems otherwise he does have some pressure sensation in his anus however special sitting for long periods time or having bowel movements.  Medications reviewed.    Physical Exam:  BP 121/86   Pulse (!) 103   Temp 98.3 F (36.8 C) (Oral)   Wt 179 lb (81.2 kg)   BMI 26.43 kg/m     PE: Abdomen is distended slightly tympanitic but nontender wound is clean no erythema no drainage half of the staples were removed and Steri-Strips with benzoin are placed.  Anal exam demonstrates no induration no cyst hemorrhoid no tenderness. Digital rectal exam was not performed at this time. No acute process identified.    Assessment/Plan:  Patient is doing very well 8 days postop I've recommended Gas-X if he feels the need otherwise he can follow up with me in 2 weeks. I expect that his bowel movements will normalize somewhat and his strength will return.  [name] E Kalisi Bevill, MD, FACS

## 2016-08-24 NOTE — Patient Instructions (Addendum)
Please call us if you have any questions or concerns.  At this time you are able to try Simethicone tablets (Gas-X) to see if this will help with your gas.  We will see you back in two weeks so we could remove the rest of your stitches.

## 2016-08-25 ENCOUNTER — Telehealth: Payer: Self-pay

## 2016-08-25 NOTE — Telephone Encounter (Signed)
Patient called in at this time and states that he is having tremendous rectal pressure and difficulty having bowel movements. I suggested to patient that he would need to use 2 fleets enemas back to back to clean out rectal area and then if he is still having difficulty he will need to call me back for further instructions.  Denies nausea, vomiting, abdominal pain, diarrhea, and fever/chills.

## 2016-08-26 ENCOUNTER — Encounter: Payer: Self-pay | Admitting: Surgery

## 2016-09-01 ENCOUNTER — Telehealth: Payer: Self-pay

## 2016-09-01 NOTE — Telephone Encounter (Signed)
Received a message through mychart from patient regarding dietary restrictions and anal pressure that he has been experiencing.   Call made to patient. No answer. Left voicemail for return phone call.

## 2016-09-04 ENCOUNTER — Other Ambulatory Visit
Admission: RE | Admit: 2016-09-04 | Discharge: 2016-09-04 | Disposition: A | Discharge status: Home / Self Care | Payer: Self-pay | Source: Ambulatory Visit | Attending: Surgery | Admitting: Surgery

## 2016-09-04 ENCOUNTER — Encounter: Payer: Self-pay | Admitting: Surgery

## 2016-09-04 ENCOUNTER — Ambulatory Visit (INDEPENDENT_AMBULATORY_CARE_PROVIDER_SITE_OTHER): Payer: Self-pay | Admitting: Surgery

## 2016-09-04 VITALS — BP 136/75 | HR 133 | Temp 98.0°F | Ht 65.0 in | Wt 177.5 lb

## 2016-09-04 DIAGNOSIS — R61 Generalized hyperhidrosis: Secondary | ICD-10-CM

## 2016-09-04 DIAGNOSIS — K5792 Diverticulitis of intestine, part unspecified, without perforation or abscess without bleeding: Secondary | ICD-10-CM

## 2016-09-04 LAB — CBC WITH DIFFERENTIAL/PLATELET
BASOS ABS: 0.1 10*3/uL (ref 0–0.1)
BASOS PCT: 0 %
Eosinophils Absolute: 0 10*3/uL (ref 0–0.7)
Eosinophils Relative: 0 %
HEMATOCRIT: 33.4 % — AB (ref 40.0–52.0)
HEMOGLOBIN: 10.9 g/dL — AB (ref 13.0–18.0)
Lymphocytes Relative: 8 %
Lymphs Abs: 1.3 10*3/uL (ref 1.0–3.6)
MCH: 27.2 pg (ref 26.0–34.0)
MCHC: 32.8 g/dL (ref 32.0–36.0)
MCV: 83 fL (ref 80.0–100.0)
MONOS PCT: 14 %
Monocytes Absolute: 2.3 10*3/uL — ABNORMAL HIGH (ref 0.2–1.0)
NEUTROS PCT: 78 %
Neutro Abs: 12.5 10*3/uL — ABNORMAL HIGH (ref 1.4–6.5)
Platelets: 545 10*3/uL — ABNORMAL HIGH (ref 150–440)
RBC: 4.02 MIL/uL — AB (ref 4.40–5.90)
RDW: 14.8 % — ABNORMAL HIGH (ref 11.5–14.5)
WBC: 16.2 10*3/uL — AB (ref 3.8–10.6)

## 2016-09-04 NOTE — Progress Notes (Signed)
Outpatient postop visit  09/04/2016  Leilani AbleJoshua A Schwark is an 35 y.o. male.    Procedure: Colon resection  CC: Penis pain  HPI: Patient is several weeks status post colon resection for diverticulitis. He is chief complaint at this point is pain in the head of the penis without discharge and he states that he has not had an erection for several weeks. Also describes night sweats but no fevers. He states he is eating well getting his strength back having normal bowel movements no melena or hematochezia and overall feels much better but his night sweats are causing him to be drenched every night for the last few days.  Medications reviewed.    Physical Exam:  BP 136/75   Pulse (!) 133   Temp 98 F (36.7 C) (Oral)   Ht 5\' 5"  (1.651 m)   Wt 177 lb 8 oz (80.5 kg)   BMI 29.54 kg/m     PE: Appears well. Vital signs are stable and afebrile. Abdomen is soft and minimally distended nontympanitic and nontender. Wounds healing well no erythema no drainage. Remaining staples are removed. Her nontender. Penis is normal soft nontender no erythema no palpable tenderness and no drainage.    Assessment/Plan:  #1 status post colon resection. Patient doing very well and returning to normal activities. He states he feels well from that standpoint. Diet is much improved and his bowel movements are normal.  #2 night sweats. Unclear etiology of this in the absence of fever and otherwise feeling well. We'll get a CBC at this point.  #3 pain in the head of the penis. No abnormalities noted on physical exam. I would like him to see a urologist for this and #4.  #4 erectile dysfunction. This would be a very unusual complication in the face of a sigmoid colon resection where his pelvis was not entered. This would be very atypical for anything other than a low anterior resection which she has not had.  Patient wishes to change primary care physicians who is not seen in a long time and was in [place].  I will help him find a new primary care physician in this area.  Patient return to see me in a month unless the CBC identifies alternative problem.  [name] E Tiffaney Heimann, MD, FACS

## 2016-09-04 NOTE — Patient Instructions (Addendum)
We are sending a referral to Dr.Feldpausch, primary care physician and  Dr.Brandon, urologist. Someone from their office will call you to make an appointment. Please stop by the Lab today. We will call you later today with the results. Please see your follow up appointment with Dr.Cooper listed below.

## 2016-09-04 NOTE — Telephone Encounter (Signed)
Spoke with Patient at this time and discussed labs (WBC)  Per Dr.Cooper patient was added to schedule 09/07/16 at 9:15. Patient was told to call office if he has abdominal pain , fever. Patient verbalized understanding.

## 2016-09-05 ENCOUNTER — Ambulatory Visit
Admission: RE | Admit: 2016-09-05 | Discharge: 2016-09-05 | Disposition: A | Discharge status: Home / Self Care | Payer: Self-pay | Source: Ambulatory Visit | Attending: Surgery | Admitting: Surgery

## 2016-09-05 ENCOUNTER — Other Ambulatory Visit
Admission: RE | Admit: 2016-09-05 | Discharge: 2016-09-05 | Disposition: A | Discharge status: Home / Self Care | Payer: Self-pay | Source: Ambulatory Visit | Attending: Surgery | Admitting: Surgery

## 2016-09-05 ENCOUNTER — Telehealth: Payer: Self-pay

## 2016-09-05 ENCOUNTER — Ambulatory Visit (INDEPENDENT_AMBULATORY_CARE_PROVIDER_SITE_OTHER): Payer: Self-pay | Admitting: Surgery

## 2016-09-05 ENCOUNTER — Telehealth: Payer: Self-pay | Admitting: Surgery

## 2016-09-05 ENCOUNTER — Inpatient Hospital Stay
Admission: AD | Admit: 2016-09-05 | Discharge: 2016-09-11 | DRG: 857 | Disposition: A | Discharge status: Home / Self Care | Payer: Self-pay | Source: Ambulatory Visit | Attending: Surgery | Admitting: Surgery

## 2016-09-05 ENCOUNTER — Encounter: Payer: Self-pay | Admitting: Surgery

## 2016-09-05 VITALS — BP 152/83 | HR 119 | Temp 99.5°F | Ht 65.0 in | Wt 177.0 lb

## 2016-09-05 DIAGNOSIS — R197 Diarrhea, unspecified: Secondary | ICD-10-CM | POA: Insufficient documentation

## 2016-09-05 DIAGNOSIS — Z885 Allergy status to narcotic agent status: Secondary | ICD-10-CM

## 2016-09-05 DIAGNOSIS — K651 Peritoneal abscess: Secondary | ICD-10-CM | POA: Insufficient documentation

## 2016-09-05 DIAGNOSIS — Z23 Encounter for immunization: Secondary | ICD-10-CM

## 2016-09-05 DIAGNOSIS — K5720 Diverticulitis of large intestine with perforation and abscess without bleeding: Secondary | ICD-10-CM | POA: Diagnosis present

## 2016-09-05 DIAGNOSIS — F17210 Nicotine dependence, cigarettes, uncomplicated: Secondary | ICD-10-CM | POA: Diagnosis present

## 2016-09-05 DIAGNOSIS — R1030 Lower abdominal pain, unspecified: Secondary | ICD-10-CM | POA: Insufficient documentation

## 2016-09-05 DIAGNOSIS — K5792 Diverticulitis of intestine, part unspecified, without perforation or abscess without bleeding: Secondary | ICD-10-CM

## 2016-09-05 DIAGNOSIS — Z452 Encounter for adjustment and management of vascular access device: Secondary | ICD-10-CM

## 2016-09-05 DIAGNOSIS — Y733 Surgical instruments, materials and gastroenterology and urology devices (including sutures) associated with adverse incidents: Secondary | ICD-10-CM | POA: Diagnosis present

## 2016-09-05 DIAGNOSIS — K660 Peritoneal adhesions (postprocedural) (postinfection): Secondary | ICD-10-CM | POA: Diagnosis present

## 2016-09-05 DIAGNOSIS — Z8379 Family history of other diseases of the digestive system: Secondary | ICD-10-CM

## 2016-09-05 DIAGNOSIS — Z9049 Acquired absence of other specified parts of digestive tract: Secondary | ICD-10-CM

## 2016-09-05 DIAGNOSIS — Z803 Family history of malignant neoplasm of breast: Secondary | ICD-10-CM

## 2016-09-05 DIAGNOSIS — K219 Gastro-esophageal reflux disease without esophagitis: Secondary | ICD-10-CM | POA: Diagnosis present

## 2016-09-05 DIAGNOSIS — Y832 Surgical operation with anastomosis, bypass or graft as the cause of abnormal reaction of the patient, or of later complication, without mention of misadventure at the time of the procedure: Secondary | ICD-10-CM | POA: Diagnosis present

## 2016-09-05 DIAGNOSIS — T814XXA Infection following a procedure, initial encounter: Principal | ICD-10-CM | POA: Diagnosis present

## 2016-09-05 DIAGNOSIS — R61 Generalized hyperhidrosis: Secondary | ICD-10-CM | POA: Insufficient documentation

## 2016-09-05 DIAGNOSIS — F17290 Nicotine dependence, other tobacco product, uncomplicated: Secondary | ICD-10-CM | POA: Diagnosis present

## 2016-09-05 DIAGNOSIS — E876 Hypokalemia: Secondary | ICD-10-CM | POA: Diagnosis not present

## 2016-09-05 LAB — CBC
HEMATOCRIT: 30.7 % — AB (ref 40.0–52.0)
HEMOGLOBIN: 10.4 g/dL — AB (ref 13.0–18.0)
MCH: 28 pg (ref 26.0–34.0)
MCHC: 33.8 g/dL (ref 32.0–36.0)
MCV: 83 fL (ref 80.0–100.0)
Platelets: 488 10*3/uL — ABNORMAL HIGH (ref 150–440)
RBC: 3.7 MIL/uL — AB (ref 4.40–5.90)
RDW: 14.7 % — ABNORMAL HIGH (ref 11.5–14.5)
WBC: 12.2 10*3/uL — AB (ref 3.8–10.6)

## 2016-09-05 LAB — C DIFFICILE QUICK SCREEN W PCR REFLEX
C DIFFICILE (CDIFF) INTERP: NOT DETECTED
C DIFFICILE (CDIFF) TOXIN: NEGATIVE
C DIFFICLE (CDIFF) ANTIGEN: NEGATIVE

## 2016-09-05 LAB — CREATININE, SERUM: Creatinine, Ser: 1.07 mg/dL (ref 0.61–1.24)

## 2016-09-05 MED ORDER — DEXTROSE-NACL 5-0.9 % IV SOLN: INTRAVENOUS | Status: DC

## 2016-09-05 MED ORDER — ONDANSETRON HCL 4 MG/2ML IJ SOLN: 4.0000 mg | Freq: Four times a day (QID) | INTRAMUSCULAR | Status: DC | PRN

## 2016-09-05 MED ORDER — HYDROMORPHONE HCL 1 MG/ML IJ SOLN
1.0000 mg | INTRAMUSCULAR | Status: DC | PRN
  Administered 2016-09-05 – 2016-09-06 (×5): 1 mg via INTRAVENOUS
  Filled 2016-09-05 (×5): qty 1

## 2016-09-05 MED ORDER — ACETAMINOPHEN 325 MG PO TABS
650.0000 mg | ORAL_TABLET | Freq: Four times a day (QID) | ORAL | Status: DC | PRN
  Filled 2016-09-05: qty 2

## 2016-09-05 MED ORDER — PIPERACILLIN-TAZOBACTAM 3.375 G IVPB 30 MIN: 3.3750 g | Freq: Three times a day (TID) | INTRAVENOUS | Status: DC

## 2016-09-05 MED ORDER — HEPARIN SODIUM (PORCINE) 5000 UNIT/ML IJ SOLN: 5000.0000 [IU] | Freq: Three times a day (TID) | INTRAMUSCULAR | Status: DC

## 2016-09-05 MED ORDER — HYDROMORPHONE HCL 2 MG/ML IJ SOLN: 1.0000 mg | INTRAMUSCULAR | Status: DC | PRN

## 2016-09-05 MED ORDER — INFLUENZA VAC SPLIT QUAD 0.5 ML IM SUSY
0.5000 mL | PREFILLED_SYRINGE | INTRAMUSCULAR | Status: AC
  Administered 2016-09-09: 0.5 mL via INTRAMUSCULAR
  Filled 2016-09-05: qty 0.5

## 2016-09-05 MED ORDER — PIPERACILLIN-TAZOBACTAM 3.375 G IVPB
3.3750 g | Freq: Three times a day (TID) | INTRAVENOUS | Status: DC
  Administered 2016-09-05 – 2016-09-10 (×16): 3.375 g via INTRAVENOUS
  Filled 2016-09-05 (×20): qty 50

## 2016-09-05 MED ORDER — DEXTROSE-NACL 5-0.9 % IV SOLN
INTRAVENOUS | Status: DC
  Administered 2016-09-05 – 2016-09-06 (×3): via INTRAVENOUS

## 2016-09-05 MED ORDER — OXYCODONE-ACETAMINOPHEN 5-325 MG PO TABS
1.0000 | ORAL_TABLET | Freq: Four times a day (QID) | ORAL | Status: DC | PRN
  Administered 2016-09-05: 1 via ORAL
  Filled 2016-09-05 (×2): qty 1

## 2016-09-05 MED ORDER — OXYCODONE HCL 5 MG PO TABS: 5.0000 mg | ORAL_TABLET | ORAL | Status: DC | PRN

## 2016-09-05 MED ORDER — HEPARIN SODIUM (PORCINE) 5000 UNIT/ML IJ SOLN
5000.0000 [IU] | Freq: Three times a day (TID) | INTRAMUSCULAR | Status: DC
  Administered 2016-09-05 – 2016-09-11 (×16): 5000 [IU] via SUBCUTANEOUS
  Filled 2016-09-05 (×18): qty 1

## 2016-09-05 MED ORDER — OXYCODONE-ACETAMINOPHEN 5-325 MG PO TABS: 1.0000 | ORAL_TABLET | Freq: Four times a day (QID) | ORAL | 0 refills | Status: DC | PRN

## 2016-09-05 MED ORDER — METRONIDAZOLE 500 MG PO TABS: 500.0000 mg | ORAL_TABLET | Freq: Three times a day (TID) | ORAL | 0 refills | Status: DC

## 2016-09-05 MED ORDER — CIPROFLOXACIN HCL 500 MG PO TABS: 500.0000 mg | ORAL_TABLET | Freq: Two times a day (BID) | ORAL | 0 refills | Status: DC

## 2016-09-05 MED ORDER — IOPAMIDOL (ISOVUE-300) INJECTION 61%
100.0000 mL | Freq: Once | INTRAVENOUS | Status: AC | PRN
  Administered 2016-09-05: 100 mL via INTRAVENOUS

## 2016-09-05 NOTE — Telephone Encounter (Signed)
Patient is to be direct admitted per Dr.Cooper.  Dr.Cooper reviewed results of CT scan with Ivin BootyJoshua via phone and was instructed to go to admitting desk at [facility].  Spoke with Dawn at this time in Admitting. Patient instructed to go to admitting desk at [facility]. Patient verbalized understanding.

## 2016-09-05 NOTE — Progress Notes (Signed)
Pt called out that he had a fever and sweats. Tem taken was 100.6, MD notified and Tylenol ordered to be given. Before giving med Pt noted that he had on  sweat pants and two pairs of socks and that may be the reason for the increased temp. On recheck Temp 100.2. Pt noted he wanted to hold on the Tylenol for now.

## 2016-09-05 NOTE — Progress Notes (Signed)
Outpatient postop visit  09/05/2016  Matthew Chaney is an 35 y.o. male.    Procedure: Sigmoid colon resection  CC: Night sweats  HPI: Patient was seen yesterday and today CBC demonstrated elevated white blood cell count. He had not been having fevers but did have night sweats. This morning he called stating that he was having night sweats again and now having abdominal pain. He's also had some pain in the tip of his penis.  The patient did not notify us yesterday that he was having 9 stools per day. He has not been able to take his temperature.  He now has bilateral lower abdominal pain.  Medications reviewed.    Physical Exam:  There were no vitals taken for this visit.    PE: Temp is 99.5. Slightly tachycardic. Normal blood pressure. Appears well in no acute distress and appears comfortable.  Abdomen is distended and nontympanitic minimally tender in the lower quadrants without peritoneal signs midline incision is healing well with no erythema and no drainage. Calves are nontender.    Assessment/Plan:  This patient proximally 4 weeks status post colon resection for acute diverticulitis. He now complains of lower abdominal pain night sweats (he has not been able to take his temperature as he does not have a thermometer). He is having 9 stools per day without melena or hematochezia.  Yesterday a CBC demonstrated an elevated white blood cell count and a slightly depressed hemoglobin.  High on the differential diagnosis is C. difficile colitis. I would like to check a C. difficile PCR and obtain a CT scan of his abdomen today and I will see him back in the office tomorrow.  I will start empiric Cipro and Flagyl today. Patient his family understood and agreed with this plan.  [name] E Kahli Fitzgerald, MD, FACS

## 2016-09-05 NOTE — Patient Instructions (Signed)
We will send you over for your CT now. Your antibiotics were sent to your pharmacy; please pick them up and begin today. We will see you back in the morning in clinic, see appointment below. We will call you when we get results of testing.

## 2016-09-05 NOTE — Telephone Encounter (Signed)
Returned phone call to patient at this time. States that he awoke throughout the night "burning up." Does not have a thermometer but states as soon as he took Tylenol he was "drenched with sweat" within a matter of 20 minutes. He is having pain in his LLQ radiating to Bilateral Flanks. Denies urinary symptoms but states that he is experiencing intense pressure at the head of his penis.   Spoke with Dr. Excell Seltzerooper in regards to this. Patient was instructed to come straight to the [place] office at this time to be evaluated.

## 2016-09-05 NOTE — H&P (Signed)
Matthew Chaney is an 35 y.o. male.    Chief Complaint: Abdominal pain  HPI: This a patient approximately 3-1/2 weeks status post colon resection for acute diverticulitis. He been doing very well and been seen in the office. Yesterday he came to the office stating that he was having some night sweats. He had not been taking his temperature is it did not have a thermometer. Otherwise he was feeling well. He did not tell me about his diarrhea. Today he called in stating that he was having 9 stools per day and having severe night sweats again but had not taken his temperature. I asked him to come to the office where review of his labs drawn yesterday demonstrated a white blood cell count of 16,000. He was experiencing a low-grade fever of 99.4 in the office and was slightly tachycardic. I ordered a CT scan. That CT scan suggested a pelvic abscess and a potential anastomotic leak. With that in mind he is being a direct admitted to the hospital for IV antibiotics and reexamination and consideration of exploration.  (Please note that an H&P and orders were entered through the office outpatient Epic setting but could not be accessed here in the hospital therefore this is a second dictation not in conflict with the note from the office that cannot be accessed at this time.)  Patient is tolerating a regular diet and in fact stopped and checked for lie on the way to the hospital to eat a full meal.  He states that this morning he started experiencing lower abdominal pain. That pain is not severe.  Patient was seen in the office this morning and a CT scan was ordered and he is seen again this afternoon at 1700 hrs. in the hospital.    Past Medical History:  Diagnosis Date  . Anxiety   . Diverticulitis   . GERD (gastroesophageal reflux disease)     Past Surgical History:  Procedure Laterality Date  . COLON RESECTION SIGMOID N/A 08/15/2016   Procedure: COLON RESECTION SIGMOID;  Surgeon: [name] E  Carigan Lister, MD;  Location: [facility] OR[facility];  Service: General;  Laterality: N/A;  . COLONOSCOPY WITH PROPOFOL N/A 07/13/2016   Procedure: COLONOSCOPY WITH PROPOFOL;  Surgeon: Midge Miniumarren Wohl, MD;  [facility];  Service: Endoscopy;  Laterality: N/A;  . NOSE SURGERY  2010    Family History  Problem Relation Age of Onset  . Diverticulitis Father   . Breast cancer Maternal Grandmother    Social History:  reports that he has been smoking Cigars.  He has been smoking about 1.00 pack per day. He has never used smokeless tobacco. He reports that he drinks alcohol. He reports that he does not use drugs.  Allergies:  Allergies  Allergen Reactions  . Vicodin [Hydrocodone-Acetaminophen] Hives and Rash  . Other Other (See Comments)    Seasonal allergies:  Sneezing, watery eyes, itchy throat. Seasonal allergies:  Sneezing, watery eyes, itchy throat.    Facility-Administered Medications Prior to Admission  Medication Dose Route Frequency Provider Last Rate Last Dose  . dextrose 5 %-0.9 % sodium chloride infusion   Intravenous Continuous [name] E Brookes Craine, MD      . heparin injection 5,000 Units  5,000 Units Subcutaneous Q8H [name] E Steele Ledonne, MD      . HYDROmorphone (DILAUDID) injection 1 mg  1 mg Intravenous Q2H PRN [name] E Santosha Jividen, MD      . oxyCODONE (Oxy IR/ROXICODONE) immediate release tablet 5 mg  5 mg Oral Q4H PRN  Lattie Haw, MD      . piperacillin-tazobactam (ZOSYN) IVPB 3.375 g  3.375 g Intravenous Q8H Lattie Haw, MD       Medications Prior to Admission  Medication Sig Dispense Refill  . ciprofloxacin (CIPRO) 500 MG tablet Take 1 tablet (500 mg total) by mouth 2 (two) times daily. 28 tablet 0  . metroNIDAZOLE (FLAGYL) 500 MG tablet Take 1 tablet (500 mg total) by mouth 3 (three) times daily. 42 tablet 0  . oxyCODONE-acetaminophen (ROXICET) 5-325 MG tablet Take 1 tablet by mouth every 6 (six) hours as needed for severe pain. 20 tablet 0     Review of Systems   Constitutional: Positive for chills and fever. Negative for diaphoresis, malaise/fatigue and weight loss.  HENT: Negative.   Eyes: Negative.   Respiratory: Negative.   Cardiovascular: Negative.   Gastrointestinal: Positive for abdominal pain and diarrhea. Negative for blood in stool, constipation, heartburn, melena, nausea and vomiting.  Genitourinary: Negative.   Musculoskeletal: Negative.   Skin: Negative.   Neurological: Negative.  Negative for weakness.  Endo/Heme/Allergies: Negative.   Psychiatric/Behavioral: Negative.      Physical Exam:  There were no vitals taken for this visit.  Physical Exam  Constitutional: He is oriented to person, place, and time and well-developed, well-nourished, and in no distress. No distress.  Appears quite comfortable and in no acute distress sitting up eating a [employer] meal  HENT:  Head: Atraumatic.  Eyes: Pupils are equal, round, and reactive to light. Right eye exhibits no discharge. Left eye exhibits no discharge. No scleral icterus.  Neck: Normal range of motion.  Cardiovascular: Normal rate, regular rhythm and normal heart sounds.   Pulmonary/Chest: Effort normal and breath sounds normal. No respiratory distress. He has no wheezes. He has no rales.  Abdominal: Soft. He exhibits distension. There is tenderness. There is no rebound and no guarding.  Minimal bilateral lower quadrant tenderness without peritoneal signs no rebound or percussion tenderness. Patient is distended. Midline incision is well healed without erythema or drainage.  Genitourinary: Penis normal. No discharge found.  Musculoskeletal: Normal range of motion. He exhibits no edema or tenderness.  Lymphadenopathy:    He has no cervical adenopathy.  Neurological: He is alert and oriented to person, place, and time.  Skin: Skin is warm and dry. No rash noted. He is not diaphoretic. No erythema.  Psychiatric: Mood and affect normal.  Vitals reviewed.       Results  for orders placed or performed during the hospital encounter of 09/05/16 (from the past 48 hour(s))  C difficile quick scan w PCR reflex     Status: None   Collection Time: 09/05/16 12:17 PM  Result Value Ref Range   C Diff antigen NEGATIVE NEGATIVE   C Diff toxin NEGATIVE NEGATIVE   C Diff interpretation No C. difficile detected.    Ct Abdomen Pelvis W Contrast  Result Date: 09/05/2016 CLINICAL DATA:  Partial colon resection dated 08/12/2016 for diverticulitis, left mid abdominal pain, diarrhea, leukocytosis x1 week. EXAM: CT ABDOMEN AND PELVIS WITH CONTRAST TECHNIQUE: Multidetector CT imaging of the abdomen and pelvis was performed using the standard protocol following bolus administration of intravenous contrast. CONTRAST:  ISOVUE-300 IOPAMIDOL (ISOVUE-300) INJECTION 61% COMPARISON:  None. FINDINGS: Lower chest: Lung bases are clear. Hepatobiliary: The liver is within normal limits. Gallbladder is unremarkable. No intrahepatic extrahepatic ductal dilatation. Pancreas: Within normal limits. Spleen: Within normal limits. Adrenals/Urinary Tract: Adrenal glands within normal limits. Kidneys are within normal limits.  No hydronephrosis. Bladder is mildly thick-walled although underdistended. Stomach/Bowel: Stomach is within normal limits. No evidence bowel obstruction. Normal appendix (series 2/image 65). Sigmoid diverticulosis, with associated mild pericolonic inflammatory changes (series 2/image 57). Status post partial left hemicolectomy with suture line in the left lower abdomen (series 2/image 60). Foci of extraluminal gas adjacent to the anastomosis (series 2/image 59). Localized fluid along the anterior aspect of the rectum, with possible discontinuity involving the anterior rectal wall (series 2/image 71). Fluid collection measures approximately 8.9 x 2.9 x 4.4 cm (series 5/image 56; series 6/image 84). No well-defined rim/wall. Additional fluid/ gas along the anterior lower abdomen (series  2/image 66). Overall appearance is considered worrisome for anastomotic leak or localized perforation along the rectum. Macroscopic perforation related to sigmoid diverticulitis is also possible. Vascular/Lymphatic: No evidence of abdominal aortic aneurysm. No suspicious abdominopelvic lymphadenopathy. Reproductive: Prostate is unremarkable. Other: Localized fluid/gas, as noted above.  No free air. Surgical clips along the left upper abdomen. Musculoskeletal: Visualized osseous structures are within normal limits. IMPRESSION: Status post partial left hemicolectomy. Extraluminal fluid/gas in the anterior lower abdomen, as above. Fluid collection measures approximately 8.9 x 2.9 x 4.4 cm, without well-defined rim. Additional scattered foci of localized gas. Overall appearance is considered worrisome for anastomotic leak or localized perforation along the anterior rectum. Macroscopic perforation related to sigmoid diverticulitis is also possible. These results were called by telephone at the time of interpretation on 09/05/2016 at 3:34 pm to Dr. Dionne MiloICHARD Parminder Cupples , who verbally acknowledged these results. Electronically Signed   By: Charline BillsSriyesh  Krishnan M.D.   On: 09/05/2016 15:55     Assessment/Plan  This patient 3-1/2 weeks status post sigmoid colectomy for diverticulitis. He is experiencing lower abdominal pain and night sweats suggestive of a fever. A CT scan has suggested pelvic abscess and the likely causes an anastomotic leak. I discussed with the patient the need for IV antibiotics and hydration and reexamination. There is a strong likelihood that he will require surgical intervention and reoperation for this abscess. Scan with the radiologist. Patient is to be rehydrated and started on IV antibiotics and consideration of reoperation either via laparoscope or any laparotomy versus full laparotomy with this much as a colostomy or ileostomy has been reviewed with him and his father. Questions were answered for  them they understood and agreed with this plan I will reexamine the patient in the morning.  [name] E Jessieca Rhem, MD, FACS

## 2016-09-05 NOTE — Telephone Encounter (Signed)
Patient called for you and you told him you would return his call this morning.

## 2016-09-05 NOTE — Progress Notes (Signed)
Matthew Chaney is an 35 y.o. male.    Chief Complaint: abd pain  HPI: This is a pt with abd pain that started this morning. He was seen in the office yesterday about 3 weeks postop from colon resection. He had been doing quite well then started having "night sweats" but did not have a thermometer to assay his temperature. He has been having 9 stools per day and the probability of C diff colitis was ruled out by neg PCR. The CT suggests a pelvic abscess and the pt may require a reoperation for drainage and possibly diversion. This has been discussed with the pt by phone as we arrange for direct admission. The pt is tolerating a regular diet at this time . The pt was seen in the office today and will be direct admitted to [facility].  Past Medical History:  Diagnosis Date  . Anxiety   . Diverticulitis   . GERD (gastroesophageal reflux disease)     Past Surgical History:  Procedure Laterality Date  . COLON RESECTION SIGMOID N/A 08/15/2016   Procedure: COLON RESECTION SIGMOID;  Surgeon: [name] E Cooper, MD;  Location: [facility] OR[facility];  Service: General;  Laterality: N/A;  . COLONOSCOPY WITH PROPOFOL N/A 07/13/2016   Procedure: COLONOSCOPY WITH PROPOFOL;  Surgeon: Midge Miniumarren Wohl, MD;  [facility];  Service: Endoscopy;  Laterality: N/A;  . NOSE SURGERY  2010    Family History  Problem Relation Age of Onset  . Diverticulitis Father   . Breast cancer Maternal Grandmother    Social History:  reports that he has been smoking Cigars.  He has been smoking about 1.00 pack per day. He has never used smokeless tobacco. He reports that he drinks alcohol. He reports that he does not use drugs.  Allergies:  Allergies  Allergen Reactions  . Vicodin [Hydrocodone-Acetaminophen] Hives and Rash  . Other Other (See Comments)    Seasonal allergies:  Sneezing, watery eyes, itchy throat. Seasonal allergies:  Sneezing, watery eyes, itchy throat.     (Not in a hospital admission)   Review of  Systems  Constitutional: Positive for chills and fever. Negative for diaphoresis, malaise/fatigue and weight loss.  HENT: Negative.   Eyes: Negative.   Respiratory: Negative.   Cardiovascular: Negative.   Gastrointestinal: Positive for abdominal pain and diarrhea. Negative for blood in stool, constipation, heartburn, melena, nausea and vomiting.  Genitourinary: Negative.   Musculoskeletal: Negative.   Skin: Negative.   Neurological: Negative.  Negative for weakness.  Endo/Heme/Allergies: Negative.   Psychiatric/Behavioral: Negative.      Physical Exam:  There were no vitals taken for this visit.  Physical Exam  Constitutional: He is oriented to person, place, and time and well-developed, well-nourished, and in no distress. No distress.  HENT:  Head: Normocephalic and atraumatic.  Eyes: Pupils are equal, round, and reactive to light. Right eye exhibits no discharge. Left eye exhibits no discharge. No scleral icterus.  Neck: Normal range of motion.  Cardiovascular: Normal rate, regular rhythm and normal heart sounds.   Pulmonary/Chest: Effort normal and breath sounds normal. No respiratory distress. He has no wheezes. He has no rales.  Abdominal: Soft. He exhibits distension. There is tenderness. There is no rebound and no guarding.  Min tenderness in both lower quadrants without peritoneal signs Midline wound is clean, no erythema  Genitourinary: Penis normal. No discharge found.  Genitourinary Comments: No elicited tenderness  Musculoskeletal: Normal range of motion. He exhibits no edema or tenderness.  Lymphadenopathy:    He  has no cervical adenopathy.  Neurological: He is alert and oriented to person, place, and time.  Skin: Skin is warm and dry. No rash noted. He is not diaphoretic. No erythema.  Psychiatric: Mood and affect normal.  Vitals reviewed.       Results for orders placed or performed during the hospital encounter of 09/05/16 (from the past 48 hour(s))  C  difficile quick scan w PCR reflex     Status: None   Collection Time: 09/05/16 12:17 PM  Result Value Ref Range   C Diff antigen NEGATIVE NEGATIVE   C Diff toxin NEGATIVE NEGATIVE   C Diff interpretation No C. difficile detected.    No results found.   Assessment/Plan  This is a pt with a pelvic abscess following sig colon resection. The plan is for direct admission, IV abx and rehydration as he is slightly tachycardic.  He may require reoperation if CT guided drainage is not possible. I have discussed this CT with the radiologist. I called the pt at home to arrange for direct admission with the above plan. I will make him NPO after midnight a consider surgical intervention as early as tomorrow.He has eaten this afternoon as he was hungry. I will again discuss the findings and the plan with the pt and family on his arrival to hospital.  [name] E Cooper, MD, FACS

## 2016-09-06 ENCOUNTER — Inpatient Hospital Stay: Payer: Self-pay | Admitting: Anesthesiology

## 2016-09-06 ENCOUNTER — Encounter
Admission: AD | Disposition: A | Discharge status: Home / Self Care | Payer: Self-pay | Source: Ambulatory Visit | Attending: Surgery

## 2016-09-06 ENCOUNTER — Ambulatory Visit: Payer: Self-pay | Admitting: Surgery

## 2016-09-06 DIAGNOSIS — K651 Peritoneal abscess: Secondary | ICD-10-CM

## 2016-09-06 HISTORY — PX: LAPAROTOMY: SHX154

## 2016-09-06 LAB — BASIC METABOLIC PANEL
Anion gap: 8 (ref 5–15)
BUN: 11 mg/dL (ref 6–20)
CALCIUM: 8 mg/dL — AB (ref 8.9–10.3)
CO2: 24 mmol/L (ref 22–32)
Chloride: 105 mmol/L (ref 101–111)
Creatinine, Ser: 0.97 mg/dL (ref 0.61–1.24)
GFR calc Af Amer: >60 mL/min (ref >60)
GLUCOSE: 121 mg/dL — AB (ref 65–99)
Potassium: 3 mmol/L — ABNORMAL LOW (ref 3.5–5.1)
SODIUM: 137 mmol/L (ref 135–145)

## 2016-09-06 LAB — CBC
HCT: 29.8 % — ABNORMAL LOW (ref 40.0–52.0)
Hemoglobin: 10.1 g/dL — ABNORMAL LOW (ref 13.0–18.0)
MCH: 28.5 pg (ref 26.0–34.0)
MCHC: 33.9 g/dL (ref 32.0–36.0)
MCV: 84 fL (ref 80.0–100.0)
PLATELETS: 434 10*3/uL (ref 150–440)
RBC: 3.54 MIL/uL — ABNORMAL LOW (ref 4.40–5.90)
RDW: 14.8 % — AB (ref 11.5–14.5)
WBC: 12 10*3/uL — AB (ref 3.8–10.6)

## 2016-09-06 SURGERY — LAPAROTOMY, EXPLORATORY
Anesthesia: General | Site: Abdomen | Wound class: Contaminated

## 2016-09-06 MED ORDER — PROPOFOL 10 MG/ML IV BOLUS
INTRAVENOUS | Status: DC | PRN
  Administered 2016-09-06: 170 mg via INTRAVENOUS

## 2016-09-06 MED ORDER — LIDOCAINE HCL (CARDIAC) 20 MG/ML IV SOLN
INTRAVENOUS | Status: DC | PRN
  Administered 2016-09-06: 80 mg via INTRAVENOUS

## 2016-09-06 MED ORDER — HYDROMORPHONE HCL 1 MG/ML IJ SOLN
0.2500 mg | INTRAMUSCULAR | Status: DC | PRN
  Administered 2016-09-06: 0.5 mg via INTRAVENOUS

## 2016-09-06 MED ORDER — SUCCINYLCHOLINE CHLORIDE 20 MG/ML IJ SOLN
INTRAMUSCULAR | Status: DC | PRN
  Administered 2016-09-06: 90 mg via INTRAVENOUS

## 2016-09-06 MED ORDER — KETOROLAC TROMETHAMINE 30 MG/ML IJ SOLN
30.0000 mg | Freq: Three times a day (TID) | INTRAMUSCULAR | Status: AC
  Administered 2016-09-06 – 2016-09-11 (×15): 30 mg via INTRAVENOUS
  Filled 2016-09-06 (×15): qty 1

## 2016-09-06 MED ORDER — OXYCODONE HCL 5 MG/5ML PO SOLN: 5.0000 mg | Freq: Once | ORAL | Status: DC | PRN

## 2016-09-06 MED ORDER — SODIUM CHLORIDE 0.9 % IV SOLN
INTRAVENOUS | Status: DC | PRN
  Administered 2016-09-06 (×2): via INTRAVENOUS

## 2016-09-06 MED ORDER — HYDROMORPHONE 1 MG/ML IV SOLN
INTRAVENOUS | Status: DC
  Administered 2016-09-06: 0.3 mg via INTRAVENOUS
  Administered 2016-09-06: 3 mg via INTRAVENOUS
  Administered 2016-09-06: 2.6 mg via INTRAVENOUS
  Administered 2016-09-07: 2.1 mg via INTRAVENOUS
  Administered 2016-09-07: 2.7 mg via INTRAVENOUS
  Administered 2016-09-07: 2.4 mg via INTRAVENOUS
  Administered 2016-09-07: 2.1 mg via INTRAVENOUS
  Administered 2016-09-07: 1.8 mg via INTRAVENOUS
  Administered 2016-09-07: 3.3 mg via INTRAVENOUS
  Filled 2016-09-06 (×2): qty 25

## 2016-09-06 MED ORDER — DIPHENHYDRAMINE HCL 50 MG/ML IJ SOLN
12.5000 mg | Freq: Four times a day (QID) | INTRAMUSCULAR | Status: DC | PRN
  Administered 2016-09-06 – 2016-09-08 (×4): 12.5 mg via INTRAVENOUS
  Filled 2016-09-06 (×4): qty 1

## 2016-09-06 MED ORDER — MORPHINE SULFATE (PF) 4 MG/ML IV SOLN
2.0000 mg | INTRAVENOUS | Status: DC | PRN
  Filled 2016-09-06: qty 1

## 2016-09-06 MED ORDER — KCL IN DEXTROSE-NACL 20-5-0.45 MEQ/L-%-% IV SOLN
INTRAVENOUS | Status: DC
  Administered 2016-09-06 – 2016-09-08 (×6): via INTRAVENOUS
  Administered 2016-09-08: 1000 mL via INTRAVENOUS
  Administered 2016-09-09: 04:00:00 via INTRAVENOUS
  Filled 2016-09-06 (×9): qty 1000

## 2016-09-06 MED ORDER — ONDANSETRON HCL 4 MG/2ML IJ SOLN
INTRAMUSCULAR | Status: DC | PRN
  Administered 2016-09-06: 4 mg via INTRAVENOUS

## 2016-09-06 MED ORDER — OXYCODONE HCL 5 MG PO TABS: 5.0000 mg | ORAL_TABLET | Freq: Once | ORAL | Status: DC | PRN

## 2016-09-06 MED ORDER — SUGAMMADEX SODIUM 200 MG/2ML IV SOLN
INTRAVENOUS | Status: DC | PRN
  Administered 2016-09-06: 180 mg via INTRAVENOUS

## 2016-09-06 MED ORDER — PROMETHAZINE HCL 25 MG/ML IJ SOLN: 6.2500 mg | INTRAMUSCULAR | Status: DC | PRN

## 2016-09-06 MED ORDER — FENTANYL CITRATE (PF) 100 MCG/2ML IJ SOLN
INTRAMUSCULAR | Status: DC | PRN
  Administered 2016-09-06: 50 ug via INTRAVENOUS
  Administered 2016-09-06: 100 ug via INTRAVENOUS
  Administered 2016-09-06 (×6): 50 ug via INTRAVENOUS

## 2016-09-06 MED ORDER — ESMOLOL HCL 100 MG/10ML IV SOLN
INTRAVENOUS | Status: DC | PRN
  Administered 2016-09-06: 50 mg via INTRAVENOUS

## 2016-09-06 MED ORDER — HYDROMORPHONE HCL 1 MG/ML IJ SOLN
0.2500 mg | INTRAMUSCULAR | Status: DC | PRN
  Administered 2016-09-06 (×5): 0.5 mg via INTRAVENOUS

## 2016-09-06 MED ORDER — MEPERIDINE HCL 25 MG/ML IJ SOLN: 6.2500 mg | INTRAMUSCULAR | Status: DC | PRN

## 2016-09-06 MED ORDER — DIPHENHYDRAMINE HCL 12.5 MG/5ML PO ELIX
12.5000 mg | ORAL_SOLUTION | Freq: Four times a day (QID) | ORAL | Status: DC | PRN
  Filled 2016-09-06: qty 5

## 2016-09-06 MED ORDER — DEXAMETHASONE SODIUM PHOSPHATE 10 MG/ML IJ SOLN
INTRAMUSCULAR | Status: DC | PRN
  Administered 2016-09-06: 4 mg via INTRAVENOUS

## 2016-09-06 MED ORDER — NALOXONE HCL 0.4 MG/ML IJ SOLN: 0.4000 mg | INTRAMUSCULAR | Status: DC | PRN

## 2016-09-06 MED ORDER — ROCURONIUM BROMIDE 100 MG/10ML IV SOLN
INTRAVENOUS | Status: DC | PRN
  Administered 2016-09-06: 5 mg via INTRAVENOUS
  Administered 2016-09-06: 30 mg via INTRAVENOUS
  Administered 2016-09-06: 10 mg via INTRAVENOUS
  Administered 2016-09-06: 20 mg via INTRAVENOUS
  Administered 2016-09-06: 5 mg via INTRAVENOUS

## 2016-09-06 MED ORDER — BUPIVACAINE HCL (PF) 0.25 % IJ SOLN
INTRAMUSCULAR | Status: DC | PRN
  Administered 2016-09-06: 30 mL

## 2016-09-06 MED ORDER — ONDANSETRON HCL 4 MG/2ML IJ SOLN: 4.0000 mg | Freq: Four times a day (QID) | INTRAMUSCULAR | Status: DC | PRN

## 2016-09-06 MED ORDER — MIDAZOLAM HCL 2 MG/2ML IJ SOLN
INTRAMUSCULAR | Status: DC | PRN
  Administered 2016-09-06: 2 mg via INTRAVENOUS
  Administered 2016-09-06: 1 mg via INTRAVENOUS

## 2016-09-06 MED ORDER — ACETAMINOPHEN 10 MG/ML IV SOLN
INTRAVENOUS | Status: DC | PRN
  Administered 2016-09-06: 1000 mg via INTRAVENOUS

## 2016-09-06 MED ORDER — SODIUM CHLORIDE 0.9% FLUSH: 9.0000 mL | INTRAVENOUS | Status: DC | PRN

## 2016-09-06 MED ORDER — FENTANYL CITRATE (PF) 100 MCG/2ML IJ SOLN
25.0000 ug | INTRAMUSCULAR | Status: DC | PRN
  Administered 2016-09-06 (×3): 50 ug via INTRAVENOUS

## 2016-09-06 MED ORDER — SODIUM CHLORIDE 0.9 % IV SOLN
30.0000 meq | Freq: Once | INTRAVENOUS | Status: AC
  Administered 2016-09-06: 30 meq via INTRAVENOUS
  Filled 2016-09-06: qty 15

## 2016-09-06 SURGICAL SUPPLY — 49 items
ADHESIVE MASTISOL STRL (MISCELLANEOUS) IMPLANT
CANISTER SUCT 1200ML W/VALVE (MISCELLANEOUS) ×4 IMPLANT
CATH TRAY 16F METER LATEX (MISCELLANEOUS) ×4 IMPLANT
CHLORAPREP W/TINT 26ML (MISCELLANEOUS) ×4 IMPLANT
CLOSURE WOUND 1/2 X4 (GAUZE/BANDAGES/DRESSINGS)
COVER CLAMP SIL LG PBX B (MISCELLANEOUS) ×4 IMPLANT
DRAIN PENROSE 5/8X12 LTX STRL (DRAIN) IMPLANT
DRAPE LAPAROTOMY 100X77 ABD (DRAPES) ×4 IMPLANT
DRSG TELFA 3X8 NADH (GAUZE/BANDAGES/DRESSINGS) ×4 IMPLANT
ELECT REM PT RETURN 9FT ADLT (ELECTROSURGICAL) ×4
ELECTRODE REM PT RTRN 9FT ADLT (ELECTROSURGICAL) ×2 IMPLANT
GAUZE SPONGE 4X4 12PLY STRL (GAUZE/BANDAGES/DRESSINGS) ×4 IMPLANT
GLOVE BIO SURGEON STRL SZ8 (GLOVE) ×16 IMPLANT
GOWN STRL REUS W/ TWL LRG LVL3 (GOWN DISPOSABLE) ×12 IMPLANT
GOWN STRL REUS W/TWL LRG LVL3 (GOWN DISPOSABLE) ×12
HANDLE YANKAUER SUCT BULB TIP (MISCELLANEOUS) ×4 IMPLANT
JACKSON PRATT 10 (INSTRUMENTS) ×8 IMPLANT
KIT RM TURNOVER STRD PROC AR (KITS) ×4 IMPLANT
LABEL OR SOLS (LABEL) ×4 IMPLANT
NDL SAFETY 22GX1.5 (NEEDLE) ×4 IMPLANT
NS IRRIG 1000ML POUR BTL (IV SOLUTION) ×4 IMPLANT
NS IRRIG 500ML POUR BTL (IV SOLUTION) ×4 IMPLANT
PACK BASIN MAJOR ARMC (MISCELLANEOUS) ×4 IMPLANT
PACK BASIN MINOR ARMC (MISCELLANEOUS) IMPLANT
PACK COLON CLEAN CLOSURE (MISCELLANEOUS) IMPLANT
SEPRAFILM MEMBRANE 5X6 (MISCELLANEOUS) IMPLANT
SPONGE LAP 18X18 5 PK (GAUZE/BANDAGES/DRESSINGS) IMPLANT
STAPLER SKIN PROX 35W (STAPLE) ×4 IMPLANT
STRIP CLOSURE SKIN 1/2X4 (GAUZE/BANDAGES/DRESSINGS) IMPLANT
SUT CHROMIC 0 CT 1 (SUTURE) IMPLANT
SUT CHROMIC 0 SH (SUTURE) IMPLANT
SUT CHROMIC 4 0 SH 27 (SUTURE) IMPLANT
SUT CHROMIC BR 1/2CLE 2-0 54IN (SUTURE) IMPLANT
SUT CHROMIC GUT BROWN 0 54 (SUTURE) IMPLANT
SUT CHROMIC GUT BROWN 0 54IN (SUTURE)
SUT ETHILON 4-0 (SUTURE)
SUT ETHILON 4-0 FS2 18XMFL BLK (SUTURE)
SUT MAXON ABS #0 GS21 30IN (SUTURE) IMPLANT
SUT PDS AB 1 TP1 54 (SUTURE) IMPLANT
SUT PROLENE 0 CT 1 30 (SUTURE) IMPLANT
SUT SILK 3-0 (SUTURE) IMPLANT
SUT VIC AB 3-0 SH 27 (SUTURE) ×20
SUT VIC AB 3-0 SH 27X BRD (SUTURE) ×20 IMPLANT
SUT VIC AB 4-0 SH 27 (SUTURE)
SUT VIC AB 4-0 SH 27XANBCTRL (SUTURE) IMPLANT
SUT VICRYL 2 0 18  UND BR (SUTURE)
SUT VICRYL 2 0 18 UND BR (SUTURE) IMPLANT
SUTURE ETHLN 4-0 FS2 18XMF BLK (SUTURE) IMPLANT
SYRINGE 10CC LL (SYRINGE) ×4 IMPLANT

## 2016-09-06 NOTE — Plan of Care (Signed)
Problem: Fluid Volume: Goal: Ability to maintain a balanced intake and output will improve Outcome: Not Progressing Patient is NPO.  Problem: Nutrition: Goal: Adequate nutrition will be maintained Outcome: Not Progressing Patient is NPO.   

## 2016-09-06 NOTE — Progress Notes (Signed)
CC: Abdominal pain Subjective: This patient readmitted the hospital following a colectomy for diverticulitis who presents with signs of a pelvic abscess.  Today he states he has abdominal pain in the low midline in both lower quadrants and somewhat periumbilical. He had fevers last night his mother states it was as high as 100.6 but 99 8 is the highest charted. He remains tachycardic. His had no nausea or vomiting.  Objective: Vital signs in last 24 hours: Temp:  [98 F (36.7 C)-99.5 F (37.5 C)] 99.5 F (37.5 C) (12/13 0447) Pulse Rate:  [113-126] 113 (12/13 0447) Resp:  [17-20] 20 (12/13 0447) BP: (114-152)/(68-83) 122/69 (12/13 0447) SpO2:  [97 %-100 %] 97 % (12/13 0447) Weight:  [177 lb (80.3 kg)-178 lb 4.8 oz (80.9 kg)] 178 lb 4.8 oz (80.9 kg) (12/12 1739) Last BM Date: 09/05/16  Intake/Output from previous day: 12/12 0701 - 12/13 0700 In: 1149.4 [P.O.:120; I.V.:997.4; IV Piggyback:32] Out: -  Intake/Output this shift: No intake/output data recorded.  Physical exam:  Awake alert and oriented. Vital signs are reviewed remains tachycardic with low-grade temp. Abdomen is distended minimally tender in the lower quadrants without peritoneal signs wound is well healed without erythema or drainage. Calves are nontender SCDs are in place  Lab Results: CBC   Recent Labs  09/05/16 1849 09/06/16 0454  WBC 12.2* 12.0*  HGB 10.4* 10.1*  HCT 30.7* 29.8*  PLT 488* 434   BMET  Recent Labs  09/05/16 1849 09/06/16 0454  NA  --  137  K  --  3.0*  CL  --  105  CO2  --  24  GLUCOSE  --  121*  BUN  --  11  CREATININE 1.07 0.97  CALCIUM  --  8.0*   PT/INR No results for input(s): LABPROT, INR in the last 72 hours. ABG No results for input(s): PHART, HCO3 in the last 72 hours.  Invalid input(s): PCO2, PO2  Studies/Results: Ct Abdomen Pelvis W Contrast  Result Date: 09/05/2016 CLINICAL DATA:  Partial colon resection dated 08/12/2016 for diverticulitis, left mid  abdominal pain, diarrhea, leukocytosis x1 week. EXAM: CT ABDOMEN AND PELVIS WITH CONTRAST TECHNIQUE: Multidetector CT imaging of the abdomen and pelvis was performed using the standard protocol following bolus administration of intravenous contrast. CONTRAST:  100mL ISOVUE-300 IOPAMIDOL (ISOVUE-300) INJECTION 61% COMPARISON:  None. FINDINGS: Lower chest: Lung bases are clear. Hepatobiliary: The liver is within normal limits. Gallbladder is unremarkable. No intrahepatic extrahepatic ductal dilatation. Pancreas: Within normal limits. Spleen: Within normal limits. Adrenals/Urinary Tract: Adrenal glands within normal limits. Kidneys are within normal limits.  No hydronephrosis. Bladder is mildly thick-walled although underdistended. Stomach/Bowel: Stomach is within normal limits. No evidence bowel obstruction. Normal appendix (series 2/image 65). Sigmoid diverticulosis, with associated mild pericolonic inflammatory changes (series 2/image 57). Status post partial left hemicolectomy with suture line in the left lower abdomen (series 2/image 60). Foci of extraluminal gas adjacent to the anastomosis (series 2/image 59). Localized fluid along the anterior aspect of the rectum, with possible discontinuity involving the anterior rectal wall (series 2/image 71). Fluid collection measures approximately 8.9 x 2.9 x 4.4 cm (series 5/image 56; series 6/image 84). No well-defined rim/wall. Additional fluid/ gas along the anterior lower abdomen (series 2/image 66). Overall appearance is considered worrisome for anastomotic leak or localized perforation along the rectum. Macroscopic perforation related to sigmoid diverticulitis is also possible. Vascular/Lymphatic: No evidence of abdominal aortic aneurysm. No suspicious abdominopelvic lymphadenopathy. Reproductive: Prostate is unremarkable. Other: Localized fluid/gas, as noted above.  No  free air. Surgical clips along the left upper abdomen. Musculoskeletal: Visualized osseous  structures are within normal limits. IMPRESSION: Status post partial left hemicolectomy. Extraluminal fluid/gas in the anterior lower abdomen, as above. Fluid collection measures approximately 8.9 x 2.9 x 4.4 cm, without well-defined rim. Additional scattered foci of localized gas. Overall appearance is considered worrisome for anastomotic leak or localized perforation along the anterior rectum. Macroscopic perforation related to sigmoid diverticulitis is also possible. These results were called by telephone at the time of interpretation on 09/05/2016 at 3:34 pm to Dr. Dionne MiloICHARD Deina Lipsey , who verbally acknowledged these results. Electronically Signed   By: Charline BillsSriyesh  Krishnan M.D.   On: 09/05/2016 15:55    Anti-infectives: Anti-infectives    Start     Dose/Rate Route Frequency Ordered Stop   09/05/16 1700  piperacillin-tazobactam (ZOSYN) IVPB 3.375 g     3.375 g 12.5 mL/hr over 240 Minutes Intravenous Every 8 hours 09/05/16 1654        Assessment/Plan:  Hypokalemia, relative. Will replace prior to surgery.  This a patient with a history of a colon resection approximately 3/2 weeks ago who presents with signs of a pelvic abscess on CT scan. He remains tachycardic and tender with an elevated white blood cell count. Discussed with he and his mother the rationale for offering surgery. My plan is to open his lower incision. His entire incision is completely healed but he has not reached the 6 week. With the PDS suture. I discussed with him the potential for having to open the entire incision to adequately repair the abdominal wound incision to prevent hernia. But more importantly I discussed with him the absolute need to drain this pus and to identify the process causing the problem be it an infected hematoma or an anastomotic leak. With the findings on CT scan it is likely to be an anastomotic leak. The alternative of percutaneous drainage with CT guidance was discussed with the radiologist yesterday and he  did not believe that that would be of value nor possible due to the location. I discussed that with the patient and with his mother patient seemed very understanding his mother did not and I reviewed my rationale multiple times with her and tried to answer all of her questions but she still did not understand the rationale for reoperation.  Identified what could happen in the face of an untreated abscess and the fact that the patient is tachycardic with an elevated white blood cell count and low-grade fever. He is young. His risk for worsening in the case of an undrained fluid collection that is likely an abscess and possibly an anastomotic leak is very high. I discussed with them sepsis and I discussed with them the risk of bleeding infection recurrent abscess drain placement and even colostomy or ileostomy placement should it be required. Much of my discussion centered around my inability to absolutely confirm what the process is except that there is likely infected fluid and possibly an anastomotic leak causing all of this. The potential repairs for this or remedies for this situation were discussed but I cannot tell exactly what the operation will be. I think that is some of the difficulties discussed with the patient's mother. The patient himself understands completely and questions were answered to his satisfaction he has consented to the surgery as proposed.  [name] E Kailoni Vahle, MD, FACS  09/06/2016

## 2016-09-06 NOTE — Anesthesia Preprocedure Evaluation (Signed)
Anesthesia Evaluation  Patient identified by MRN, date of birth, ID band Patient awake    Reviewed: Allergy & Precautions, NPO status , Patient's Chart, lab work & pertinent test results  History of Anesthesia Complications Negative for: history of anesthetic complications  Airway Mallampati: II  TM Distance: >3 FB Neck ROM: Full    Dental  (+) Chipped,    Pulmonary neg sleep apnea, neg COPD, former smoker,    breath sounds clear to auscultation- rhonchi (-) wheezing      Cardiovascular Exercise Tolerance: Good (-) hypertension(-) CAD and (-) Past MI  Rhythm:Regular Rate:Normal - Systolic murmurs and - Diastolic murmurs    Neuro/Psych Anxiety negative neurological ROS     GI/Hepatic Neg liver ROS, GERD  ,S/p sigmoid colon resection, now with pelvic abscess and concern for anastomotic leak   Endo/Other  negative endocrine ROSneg diabetes  Renal/GU negative Renal ROS     Musculoskeletal negative musculoskeletal ROS (+)   Abdominal (+) - obese,   Peds  Hematology negative hematology ROS (+)   Anesthesia Other Findings Past Medical History: No date: Anxiety No date: Diverticulitis No date: GERD (gastroesophageal reflux disease)   Reproductive/Obstetrics                             Anesthesia Physical  Anesthesia Plan  ASA: II  Anesthesia Plan: General   Post-op Pain Management:    Induction: Intravenous  Airway Management Planned: Oral ETT  Additional Equipment:   Intra-op Plan:   Post-operative Plan: Extubation in OR  Informed Consent: I have reviewed the patients History and Physical, chart, labs and discussed the procedure including the risks, benefits and alternatives for the proposed anesthesia with the patient or authorized representative who has indicated his/her understanding and acceptance.   Dental advisory given  Plan Discussed with: CRNA and  Anesthesiologist  Anesthesia Plan Comments:         Anesthesia Quick Evaluation

## 2016-09-06 NOTE — Anesthesia Procedure Notes (Signed)
Procedure Name: Intubation Performed by: Zavian Slowey Pre-anesthesia Checklist: Patient identified, Patient being monitored, Timeout performed, Emergency Drugs available and Suction available Patient Re-evaluated:Patient Re-evaluated prior to inductionOxygen Delivery Method: Circle system utilized Preoxygenation: Pre-oxygenation with 100% oxygen Intubation Type: IV induction Ventilation: Mask ventilation without difficulty Laryngoscope Size: Mac and 3 Grade View: Grade I Tube type: Oral Tube size: 7.5 mm Number of attempts: 1 Airway Equipment and Method: Stylet Placement Confirmation: ETT inserted through vocal cords under direct vision,  positive ETCO2 and breath sounds checked- equal and bilateral Secured at: 22 cm Tube secured with: Tape Dental Injury: Teeth and Oropharynx as per pre-operative assessment        

## 2016-09-06 NOTE — Transfer of Care (Signed)
Immediate Anesthesia Transfer of Care Note  Patient: Matthew Chaney  Procedure(s) Performed: Procedure(s): DRAINAGE OF PELVIC ABSCESS, ILEOSTOMY (N/A)  Patient Location: PACU  Anesthesia Type:General  Level of Consciousness: awake, alert , oriented and patient cooperative  Airway & Oxygen Therapy: Patient Spontanous Breathing and Patient connected to face mask oxygen  Post-op Assessment: Report given to RN, Post -op Vital signs reviewed and stable and Patient moving all extremities X 4  Post vital signs: Reviewed and stable  Last Vitals:  Vitals:   09/06/16 0447 09/06/16 1554  BP: 122/69   Pulse: (!) 113   Resp: 20   Temp: 37.5 C 37.3 C    Last Pain:  Vitals:   09/06/16 1106  TempSrc:   PainSc: 7       Patients Stated Pain Goal: 0 (09/06/16 1106)  Complications: No apparent anesthesia complications

## 2016-09-06 NOTE — Progress Notes (Signed)
Full note to follow Dicussed condition and options with pt and mother

## 2016-09-06 NOTE — Op Note (Signed)
Pre-operative Diagnosis: Pelvic abscess  Post-operative Diagnosis: Pelvic abscess  Surgeon: Dionne Miloichard Larken Urias   Assistants: Surgical tech  Surgeon: Adah Salvageichard E. Excell Seltzerooper, MD FACS  Anesthesia: Gen. with endotracheal tube   Procedure Details  The patient was seen again in the Holding Room. The benefits, complications, treatment options, and expected outcomes were discussed with the patient. The risks of bleeding, infection, recurrence of symptoms, failure to resolve symptoms,  bowel injury, any of which could require further surgery were reviewed with the patient.  We emphasized the unclear nature of his illness but suspected pelvic abscess and the need for drainage as well as the potential for ileostomy or colostomy and the need for reoperation. He has family understood and agreed to proceed. The patient was taken to Operating Room, identified as Matthew Chaney and the procedure verified.  A Time Out was held and the above information confirmed.  Prior to the induction of general anesthesia, antibiotic prophylaxis was administered. VTE prophylaxis was in place. General endotracheal anesthesia was then administered and tolerated well. A Foley catheter was placed . After the induction, the abdomen was prepped with Chloraprep and draped in the sterile fashion. The patient was positioned in the supine position.  Description of procedure: The lower midline incision was opened and the PDS suture from prior operation was identified and found to be very friable and being 4 weeks out from surgery and lost most of its integrity. The fascia was opened and the abdominal cavity was entered. In the low midline and extensive intense adhesions were identified and bowel loops could not be individually dissected out nor identified. Extension of the incision up to the umbilicus was performed and ultimately entry into the free space of the abdominal cavity could be performed especially on the right side. The bowel in  the right side was not involved.  Intense inflammatory reaction on the midline and extending to the left side was encountered. Blunt and careful finger dissection was performed to avoid enterotomy. Ultimately an abscess cavity was entered and purulent material was cultured and then aspirated. Further careful dissection was performed and a second abscess cavity was entered  from the left side. Drainage was performed. These abscess cavities were adjacent and possibly even in continuity. Irrigation was performed until clear.  As no succus had been identified nor stool and due to the fact that the pelvis was intensely inflamed and essentially frozen, there was consideration that further dissection would result in enterotomies and result in the need for extensive adhesio lysis and possible further injury. There was no sign of active fistula from the anastomotic site although the anastomosis could not be directly visualized no stool was ever encountered. It was decided that the abscess probably arose from an anastomotic leak that had sealed as the patient has been having multiple bowel movements per day and eating without difficulty.  At this point I asked Dr. Orvis BrillLoflin to enter the operating room. She did not scrub but I reviewed for her my findings and my plan for drain placement following irrigation of the abscess cavity as well as a diverting ileostomy. She was in agreement with this suggested plan. She was also concerned that further dissection of the frozen pelvis would result in further injury or enterotomy or fistula.  With that in mind and feeling that the abscess cavity had been adequately drained a 10 mm JP drain was brought in through the left anterior abdominal wall trimmed and then placed into the abscess cavity and held in  with a 3-0 nylon. A second 10 mm JP drain was brought in on the right side and placed down into the right pelvis. There was no purulence or abscess cavity on the right but due to the  severity inflammation a drain was warranted.  A site for ileostomy was chosen and a circle of skin was excised and dissection down to the fascia was performed and a cruciate incision was made. Attempts were made to identify the terminal ileum both from the right side of the table and the left side of the table. There was considerable inflammation in the area of the cecum and appendix. The appendix could be identified but the terminal ileum was not easily identified. After multiple attempts at trying to identify the terminal ileum and concern for causing an enterotomy a loop was chosen as it was believed to be the terminal ileum entering the cecum. This loop was elevated into the ileostomy wound.  At this point the midline wound was irrigated and then closed with #1 PDS in a running fashion. Sponge lap needle count was correct at this time.  The ileostomy was matured everting the mucosa of the proximal loop with 3-0 Vicryl sector sutures. The defunctionalized loop was also sutured down with 3-0 Vicryl. Digital examination as well as placement of a non-suctioned Yankauer sucker tip demonstrated patency through the abdominal wall. Once assuring that the ileostomy was patent and functional attention was returned to the midline wound..  The midline wound was closed over a Penrose drain and staples were placed. Sterile dressing was placed and the ileostomy appliance was placed. The ileostomy was viable at the end of the procedure.  The patient tolerated this procedure well there were no complications he was taken to the recovery room in stable condition to be admitted for continued care sponge lap needle count was correct.  Findings: Pelvic abscess with frozen pelvis and intense inflammatory process cultures obtained Estimated Blood Loss: 100 cc         Drains: Penrose on the midline incision, 10 mm JP drains in the pelvis         Specimens: Pelvic abscess fluid for culture and sensitivity        Complications: None         Condition: Stable   [name] E. Excell Seltzerooper, MD, FACS

## 2016-09-06 NOTE — Anesthesia Postprocedure Evaluation (Signed)
Anesthesia Post Note  Patient: Matthew Chaney  Procedure(s) Performed: Procedure(s) (LRB): DRAINAGE OF PELVIC ABSCESS, ILEOSTOMY (N/A)  Patient location during evaluation: PACU Anesthesia Type: General Level of consciousness: awake and alert Pain management: pain level controlled Vital Signs Assessment: post-procedure vital signs reviewed and stable Respiratory status: spontaneous breathing and respiratory function stable Cardiovascular status: stable Anesthetic complications: no    Last Vitals:  Vitals:   09/06/16 1655 09/06/16 1710  BP: (!) 139/97 (!) 131/93  Pulse: (!) 108 (!) 107  Resp: 12 15  Temp:      Last Pain:  Vitals:   09/06/16 1655  TempSrc:   PainSc: Asleep                 Lucrecia Mcphearson K

## 2016-09-06 NOTE — Plan of Care (Signed)
Patient going to OR about 1300.  Consents signed and on chart. Fluids running. Potassium infusing. Zosyn given.  CHG x 2 completed.  NPO since midnight/admit.  Family and patient  able to confirm Gen surgery's intent for surgery with second opinion. Pain under control - last gave Dilaudid at 1115.  Report given to OR and Hand off completed.

## 2016-09-07 ENCOUNTER — Encounter: Payer: Self-pay | Admitting: Surgery

## 2016-09-07 LAB — CBC WITH DIFFERENTIAL/PLATELET
BASOS ABS: 0 10*3/uL (ref 0–0.1)
Basophils Relative: 0 %
EOS ABS: 0 10*3/uL (ref 0–0.7)
EOS PCT: 0 %
HCT: 31.6 % — ABNORMAL LOW (ref 40.0–52.0)
Hemoglobin: 10.5 g/dL — ABNORMAL LOW (ref 13.0–18.0)
LYMPHS PCT: 12 %
Lymphs Abs: 1.3 10*3/uL (ref 1.0–3.6)
MCH: 27.9 pg (ref 26.0–34.0)
MCHC: 33.1 g/dL (ref 32.0–36.0)
MCV: 84.2 fL (ref 80.0–100.0)
Monocytes Absolute: 1.2 10*3/uL — ABNORMAL HIGH (ref 0.2–1.0)
Monocytes Relative: 11 %
Neutro Abs: 8.2 10*3/uL — ABNORMAL HIGH (ref 1.4–6.5)
Neutrophils Relative %: 77 %
PLATELETS: 502 10*3/uL — AB (ref 150–440)
RBC: 3.75 MIL/uL — ABNORMAL LOW (ref 4.40–5.90)
RDW: 14.5 % (ref 11.5–14.5)
WBC: 10.8 10*3/uL — AB (ref 3.8–10.6)

## 2016-09-07 LAB — COMPREHENSIVE METABOLIC PANEL
ALT: 15 U/L — AB (ref 17–63)
AST: 14 U/L — AB (ref 15–41)
Albumin: 2 g/dL — ABNORMAL LOW (ref 3.5–5.0)
Alkaline Phosphatase: 107 U/L (ref 38–126)
Anion gap: 3 — ABNORMAL LOW (ref 5–15)
BUN: 10 mg/dL (ref 6–20)
CHLORIDE: 111 mmol/L (ref 101–111)
CO2: 27 mmol/L (ref 22–32)
CREATININE: 0.89 mg/dL (ref 0.61–1.24)
Calcium: 7.8 mg/dL — ABNORMAL LOW (ref 8.9–10.3)
GFR calc Af Amer: >60 mL/min (ref >60)
GFR calc non Af Amer: >60 mL/min (ref >60)
Glucose, Bld: 131 mg/dL — ABNORMAL HIGH (ref 65–99)
Potassium: 3.8 mmol/L (ref 3.5–5.1)
SODIUM: 141 mmol/L (ref 135–145)
Total Bilirubin: 0.6 mg/dL (ref 0.3–1.2)
Total Protein: 5.7 g/dL — ABNORMAL LOW (ref 6.5–8.1)

## 2016-09-07 MED ORDER — NYSTATIN 100000 UNIT/GM EX CREA
TOPICAL_CREAM | Freq: Three times a day (TID) | CUTANEOUS | Status: DC
  Filled 2016-09-07: qty 15

## 2016-09-07 MED ORDER — ACETAMINOPHEN 325 MG PO TABS
650.0000 mg | ORAL_TABLET | Freq: Four times a day (QID) | ORAL | Status: DC
  Administered 2016-09-07 – 2016-09-11 (×18): 650 mg via ORAL
  Filled 2016-09-07 (×18): qty 2

## 2016-09-07 MED ORDER — CYCLOBENZAPRINE HCL 10 MG PO TABS
10.0000 mg | ORAL_TABLET | Freq: Three times a day (TID) | ORAL | Status: DC
  Administered 2016-09-07 – 2016-09-11 (×14): 10 mg via ORAL
  Filled 2016-09-07 (×14): qty 1

## 2016-09-07 MED ORDER — PANTOPRAZOLE SODIUM 40 MG PO TBEC
40.0000 mg | DELAYED_RELEASE_TABLET | Freq: Every day | ORAL | Status: DC
  Administered 2016-09-07 – 2016-09-11 (×5): 40 mg via ORAL
  Filled 2016-09-07 (×5): qty 1

## 2016-09-07 NOTE — Progress Notes (Signed)
1 Day Post-Op  Subjective: Pop ileostomy. Bag just emptied, serous fluid present now. Pt feels well, no breakthrough pain No n/v  Objective: Vital signs in last 24 hours: Temp:  [98.1 F (36.7 C)-99.2 F (37.3 C)] 98.1 F (36.7 C) (12/14 0650) Pulse Rate:  [87-110] 102 (12/14 0650) Resp:  [12-22] 18 (12/14 0650) BP: (109-159)/(69-108) 109/69 (12/14 0650) SpO2:  [93 %-100 %] 99 % (12/14 0650) Last BM Date: 09/06/16  Intake/Output from previous day: 12/13 0701 - 12/14 0700 In: 3028.3 [I.V.:2998.3; NG/GT:30] Out: 855 [Urine:400; Emesis/NG output:150; Drains:105; Blood:200] Intake/Output this shift: No intake/output data recorded.  Physical exam:  Less tachycardic, appears comfortable afeb Soft abc, ostomy with serous fluid only Wound dressed Drains fxnl, no succus NT calves, SCD not on  Lab Results: CBC   Recent Labs  09/06/16 0454 09/07/16 0500  WBC 12.0* 10.8*  HGB 10.1* 10.5*  HCT 29.8* 31.6*  PLT 434 502*   BMET  Recent Labs  09/06/16 0454 09/07/16 0500  NA 137 141  K 3.0* 3.8  CL 105 111  CO2 24 27  GLUCOSE 121* 131*  BUN 11 10  CREATININE 0.97 0.89  CALCIUM 8.0* 7.8*   PT/INR No results for input(s): LABPROT, INR in the last 72 hours. ABG No results for input(s): PHART, HCO3 in the last 72 hours.  Invalid input(s): PCO2, PO2  Studies/Results: Ct Abdomen Pelvis W Contrast  Result Date: 09/05/2016 CLINICAL DATA:  Partial colon resection dated 08/12/2016 for diverticulitis, left mid abdominal pain, diarrhea, leukocytosis x1 week. EXAM: CT ABDOMEN AND PELVIS WITH CONTRAST TECHNIQUE: Multidetector CT imaging of the abdomen and pelvis was performed using the standard protocol following bolus administration of intravenous contrast. CONTRAST:  100mL ISOVUE-300 IOPAMIDOL (ISOVUE-300) INJECTION 61% COMPARISON:  None. FINDINGS: Lower chest: Lung bases are clear. Hepatobiliary: The liver is within normal limits. Gallbladder is unremarkable. No  intrahepatic extrahepatic ductal dilatation. Pancreas: Within normal limits. Spleen: Within normal limits. Adrenals/Urinary Tract: Adrenal glands within normal limits. Kidneys are within normal limits.  No hydronephrosis. Bladder is mildly thick-walled although underdistended. Stomach/Bowel: Stomach is within normal limits. No evidence bowel obstruction. Normal appendix (series 2/image 65). Sigmoid diverticulosis, with associated mild pericolonic inflammatory changes (series 2/image 57). Status post partial left hemicolectomy with suture line in the left lower abdomen (series 2/image 60). Foci of extraluminal gas adjacent to the anastomosis (series 2/image 59). Localized fluid along the anterior aspect of the rectum, with possible discontinuity involving the anterior rectal wall (series 2/image 71). Fluid collection measures approximately 8.9 x 2.9 x 4.4 cm (series 5/image 56; series 6/image 84). No well-defined rim/wall. Additional fluid/ gas along the anterior lower abdomen (series 2/image 66). Overall appearance is considered worrisome for anastomotic leak or localized perforation along the rectum. Macroscopic perforation related to sigmoid diverticulitis is also possible. Vascular/Lymphatic: No evidence of abdominal aortic aneurysm. No suspicious abdominopelvic lymphadenopathy. Reproductive: Prostate is unremarkable. Other: Localized fluid/gas, as noted above.  No free air. Surgical clips along the left upper abdomen. Musculoskeletal: Visualized osseous structures are within normal limits. IMPRESSION: Status post partial left hemicolectomy. Extraluminal fluid/gas in the anterior lower abdomen, as above. Fluid collection measures approximately 8.9 x 2.9 x 4.4 cm, without well-defined rim. Additional scattered foci of localized gas. Overall appearance is considered worrisome for anastomotic leak or localized perforation along the anterior rectum. Macroscopic perforation related to sigmoid diverticulitis is also  possible. These results were called by telephone at the time of interpretation on 09/05/2016 at 3:34 pm to Dr. Dionne MiloICHARD Damyn Weitzel ,  who verbally acknowledged these results. Electronically Signed   By: Charline BillsSriyesh  Krishnan M.D.   On: 09/05/2016 15:55    Anti-infectives: Anti-infectives    Start     Dose/Rate Route Frequency Ordered Stop   09/05/16 1700  piperacillin-tazobactam (ZOSYN) IVPB 3.375 g     3.375 g 12.5 mL/hr over 240 Minutes Intravenous Every 8 hours 09/05/16 1654        Assessment/Plan: s/p Procedure(s): DRAINAGE OF PELVIC ABSCESS, ILEOSTOMY   Improved overall DC NG today, cont foley cath Clear liq diet PICC line in place  [name] E Tamlyn Sides, MD, FACS  09/07/2016

## 2016-09-07 NOTE — Consult Note (Signed)
WOC Nurse ostomy consult note Stoma type/location: RLQ Ileostomy due to pelvic abscess.  Mother at bedside. Will be assisting with care at home.  Stomal assessment/size: 1 1/4" pink and moist  Well budded stoma.  Pink patent and producing liquid brown stool.   Peristomal assessment: not assessed.  Pouch was changed this AM when patient got up to move around unit.  Close proximity to midline abdominal incision.  Treatment options for stomal/peristomal skin: None Output Liquid brown stool.  Ostomy pouching: 2pc. 2 1/4" pouch Education provided: Mother and patient at bedside.  Discussed emptying, showering, frequency of pouch change.  Patient is dozing during discussion.  Will perform another pouch change with their help tomorrow.  Acceptable time for both was agreed upon.  Written materials provided.  Enrolled patient in [employer] DC program: No WOC team will follow and remain available to patient, medical and nursing teams.  Maple HudsonKaren Ece Cumberland RN BSN CWON Pager [phone]

## 2016-09-08 LAB — BASIC METABOLIC PANEL
Anion gap: 7 (ref 5–15)
BUN: 10 mg/dL (ref 6–20)
CO2: 23 mmol/L (ref 22–32)
CREATININE: 1.04 mg/dL (ref 0.61–1.24)
Calcium: 8.2 mg/dL — ABNORMAL LOW (ref 8.9–10.3)
Chloride: 109 mmol/L (ref 101–111)
Glucose, Bld: 121 mg/dL — ABNORMAL HIGH (ref 65–99)
Potassium: 3.6 mmol/L (ref 3.5–5.1)
SODIUM: 139 mmol/L (ref 135–145)

## 2016-09-08 LAB — CBC WITH DIFFERENTIAL/PLATELET
BASOS PCT: 0 %
Basophils Absolute: 0 10*3/uL (ref 0–0.1)
EOS ABS: 0.2 10*3/uL (ref 0–0.7)
EOS PCT: 2 %
HCT: 31.1 % — ABNORMAL LOW (ref 40.0–52.0)
Hemoglobin: 10.6 g/dL — ABNORMAL LOW (ref 13.0–18.0)
LYMPHS ABS: 1.5 10*3/uL (ref 1.0–3.6)
Lymphocytes Relative: 17 %
MCH: 28.4 pg (ref 26.0–34.0)
MCHC: 34 g/dL (ref 32.0–36.0)
MCV: 83.5 fL (ref 80.0–100.0)
MONOS PCT: 10 %
Monocytes Absolute: 0.8 10*3/uL (ref 0.2–1.0)
NEUTROS PCT: 71 %
Neutro Abs: 6.3 10*3/uL (ref 1.4–6.5)
PLATELETS: 463 10*3/uL — AB (ref 150–440)
RBC: 3.72 MIL/uL — AB (ref 4.40–5.90)
RDW: 14.8 % — ABNORMAL HIGH (ref 11.5–14.5)
WBC: 8.8 10*3/uL (ref 3.8–10.6)

## 2016-09-08 MED ORDER — HYDROMORPHONE HCL 1 MG/ML IJ SOLN
0.5000 mg | INTRAMUSCULAR | Status: DC | PRN
  Administered 2016-09-08 – 2016-09-11 (×6): 0.5 mg via INTRAVENOUS
  Filled 2016-09-08 (×6): qty 0.5

## 2016-09-08 MED ORDER — OXYCODONE HCL 5 MG PO TABS
10.0000 mg | ORAL_TABLET | ORAL | Status: DC | PRN
  Administered 2016-09-08 – 2016-09-09 (×6): 10 mg via ORAL
  Filled 2016-09-08 (×6): qty 2

## 2016-09-08 MED ORDER — ENSURE ENLIVE PO LIQD
237.0000 mL | Freq: Three times a day (TID) | ORAL | Status: DC
  Administered 2016-09-08 – 2016-09-11 (×9): 237 mL via ORAL

## 2016-09-08 NOTE — Progress Notes (Signed)
35 year old male who is postop day #2 from open drainage of a pelvic abscess and ileostomy creation after a colectomy for diverticulitis.  The patient is doing well this a.m. he states that he is abdominal pain has been doing well although he does have some occasional sharp pains. He has been anxious about the ostomy and some of the noises but it makes. He did have the wound ostomy nurse come yesterday to begin teaching. Mother is in the room with them who will also be here to learn how to take care of the ostomy. The patient tolerated clear liquids well with output from the ostomy although he has been afraid to drink. He has not had any pain taken the clear liquids and although he is fearful of this. He has been able to urinate some having the Foley out.  Vitals:   09/08/16 0400 09/08/16 0521  BP:  132/83  Pulse:  (!) 113  Resp: 18 (!) 22  Temp:  98.7 F (37.1 C)   I/O last 3 completed shifts: In: 3019.4 [P.O.:360; I.V.:2183.7; NG/GT:30; IV Piggyback:445.6] Out: 2605 [Urine:1050; Emesis/NG output:150; Drains:205; Stool:1200] No intake/output data recorded.   PE:  Gen: NAD Abd: soft, mild distension, incision clean and dry with penrose in place, minimal drainage, JP drains with serosanginous drainage Left side about 120cc and right 65cc, ileostomy pink and patent with some liquid stool in bag Ext: 2+ pulses, no edema   CBC Latest Ref Rng & Units 09/08/2016 09/07/2016 09/06/2016  WBC 3.8 - 10.6 K/uL 8.8 10.8(H) 12.0(H)  Hemoglobin 13.0 - 18.0 g/dL 10.6(L) 10.5(L) 10.1(L)  Hematocrit 40.0 - 52.0 % 31.1(L) 31.6(L) 29.8(L)  Platelets 150 - 440 K/uL 463(H) 502(H) 434   CMP Latest Ref Rng & Units 09/08/2016 09/07/2016 09/06/2016  Glucose 65 - 99 mg/dL 161(W121(H) 960(A131(H) 540(J121(H)  BUN 6 - 20 mg/dL 10 10 11   Creatinine 0.61 - 1.24 mg/dL 8.111.04 9.140.89 7.820.97  Sodium 135 - 145 mmol/L 139 141 137  Potassium 3.5 - 5.1 mmol/L 3.6 3.8 3.0(L)  Chloride 101 - 111 mmol/L 109 111 105  CO2 22 - 32 mmol/L 23 27 24    Calcium 8.9 - 10.3 mg/dL 8.2(L) 7.8(L) 8.0(L)  Total Protein 6.5 - 8.1 g/dL - 5.7(L) -  Total Bilirubin 0.3 - 1.2 mg/dL - 0.6 -  Alkaline Phos 38 - 126 U/L - 107 -  AST 15 - 41 U/L - 14(L) -  ALT 17 - 63 U/L - 15(L) -   A/P:  35 year old male who is postop day #2 from open drainage of a pelvic abscess and ileostomy creation after a colectomy for diverticulitis.  Pain:  Scheduled tylenol, toradol, flexeril with prn oxycodone and dilaudid, d/c pca today GI: advance to full liquids today, continue education with ostomy nurse, will continue JP drains until drainage decreases and likely will need CT before pulling Left sided drain.   GU: voiding well without foley, will continue to monitor Encourage ambulation in hallways today

## 2016-09-08 NOTE — Care Management (Signed)
Admitted to this facility with the diagnosis of pelvic abscess. Lives with mother Georga Borangie Farrington [phone]). New ilieostomy placed during this visit. Last [place] his primary care physician [place].  Seen Dr. Mechele CollinElliott 06/05/16. Takes care of all basic activities of daily living himself, drives. 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 Uses no aids for ambulation. No home services in the past, No falls. A resident of [place]. Never been to the [facility]. Pres[facility] in [place].  States he has received bills from the hospital and he has received letters stating that the [facility] has paid for services. These letters have different account numbers on them.  [facility] need [facility]. Gwenette GreetBrenda S Labella Zahradnik RN MSN CCM Care Management

## 2016-09-08 NOTE — Consult Note (Signed)
WOC Nurse ostomy consult note Stoma type/location: RLQ Ileostomy  Mother at bedside.  Assisting with pouch change today.  Patient is tearful and emotional support provided.  Written materials provided.  3 pouch changes/barrier rings left at bedside.  Stomal assessment/size: 1 3/4" edematous, sloughing top layer with pink viable tissue beneath.  Stoma is patent and producing liquid brown stool.  Peristomal assessment: Intact.  Abdomen is tender.  Midline abdominal incision with dressing intact.  Treatment options for stomal/peristomal skin: barrier ring to promote seal.  Output liquid brown stool Ostomy pouching: 2pc. 2 1/4" pouch with barrier ring.  Education provided: Discussed three times weekly pouch changes.  Measured stoma, mother cut barrier to fit.  Patient and mother able to open roll closure and secure.  Understand to empty when 1/3 full.  Demonstrated toilet paper wicks and cleaning outside of pouch when emptying.  Enrolled patient in [employer] DC program: Yes will today.  New current address provided and will use that address: [address] [place], [place] [address].  Phone # is correct WOC team will follow and remain available to patient, medical and nursing teams.  Maple HudsonKaren Millie Shorb RN BSN CWON Pager [phone]

## 2016-09-08 NOTE — Progress Notes (Signed)
Visit patient this evening. He states feeling much better but cold throughout the day however and is asking for additional blanket. His vital signs show no sign of fever since a very low-grade 99 last night.  He has good ostomy output but states he is afraid to eat.  Matthew Chaney is soft nondistended nontender wound is clean with Penrose drain in place ileostomy is having good output. The right JP drain is serosanguineous the left JP drain is frankly purulent.  White blood cell count is 8  Patient is doing much better now on IV antibiotics. I plan would be to continue the drains in place while the right side could be removed at some point I would leave the left side in for the foreseeable future and in fact plan on sending him home with the drain in all likelihood. We may consider repeating CT scan later in this course. However warm poorly I would consider home IV antibiotics as he now has a PICC line and would benefit from long-term antibiotics. 2 Invanz every 24 at that point.  I will see again in the morning I do not see the need for rechecking his blood counts as he has normalized. As discussed with Dr. Orvis BrillLoflin.

## 2016-09-08 NOTE — Progress Notes (Signed)
Initial Nutrition Assessment  DOCUMENTATION CODES:   Underweight  INTERVENTION:  -Reinforced importance of adequate hydration and nutrition; encouraged smaller, more frequent meals -Recommend Ensure Enlive po BID, each supplement provides 350 kcal and 20 grams of protein -Recommend chewable MVI -Provided pt with "Ileostomy Nutrition Therapy" handout and provided verbal education. Pt receptive to education  NUTRITION DIAGNOSIS:   Food and nutrition related knowledge deficit related to  (specialized diet) as evidenced by  (new ileostomy).  GOAL:   Patient will meet greater than or equal to 90% of their needs   MONITOR:   PO intake, Supplement acceptance, Diet advancement, Labs, Weight trends  REASON FOR ASSESSMENT:   Malnutrition Screening Tool    ASSESSMENT:    35 yo male admitted with pelvic abscess, pt 3 weeks postop from colon resection. Pt s/p open drainage of pelvic abscess  ileosotomy on 12/13 post colectomy for diverticulitis   Pt tolerating CL diet today, [facility] but is apprehenive about eating, especially about introduction of solid foods. Pt reports prior to admission, appetite had improved and pt eating well. Pt drinking 2 Ensure per day and eating 3 meals per day prepared by his Mother (pt reports eating a whole plate of BBQ chicken, mashed potatoes, corn and green beans). Pt reports he weighed 185 pounds in November, current wt of 178 pounds; 3.8% wt loss.   Nutrition-Focused physical exam completed. Findings are WDL for fat depletion, muscle depletion, and edema.   Labs: reviewed Meds: reviewed  Diet Order:  Diet full liquid Room service appropriate? Yes; Fluid consistency: Thin  Skin:  Reviewed, no issues  Last BM:  12/13  Height:   Ht Readings from Last 1 Encounters:  09/05/16 5\' 5"  (1.651 m)    Weight:   Wt Readings from Last 1 Encounters:  09/05/16 178 lb 4.8 oz (80.9 kg)    BMI:  Body mass index is 29.67 kg/m.  Estimated  Nutritional Needs:   Kcal:  2100-2400 kcals  Protein:  100-120 g   Fluid:  >/= 2.1 L  EDUCATION NEEDS:   Education needs addressed  Matthew Starcherate Chang Tiggs MS, RD, LDN [phone] Pager  [phone] Weekend/On-Call Pager

## 2016-09-08 NOTE — Plan of Care (Signed)
Problem: Food- and Nutrition-Related Knowledge Deficit (NB-1.1) Goal: Nutrition education Formal process to instruct or train a patient/client in a skill or to impart knowledge to help patients/clients voluntarily manage or modify food choices and eating behavior to maintain or improve health. Outcome: Adequate for Discharge Nutrition Education Note  RD consulted for nutrition education regarding a diet status post ileostomy.  RD provided "Ileostomy Nutrition Therapy" handout from the Academy of [employer]. Encouraged patient to keep fiber intake below 8 grams during transition from liquids to solids, and below 13 grams per day as symptoms subside. Encouraged patient to consume refined and white grain foods with less than 2g of fiber per serving as well as soft, well-cooked proteins, and well cooked vegetables without seeds or skin.  RD discussed why it is important to adhere to list of recommended foods, and foods to avoid, to maintain ostomy integrity and decrease risk of gas, diarrhea, and blockage related complications. Encouraged patient to consume 8 to 10 cups of liquid per day and to drink liquids 30 minutes after meals or snacks to prevent flushing.   Provided tips to ensure adequate absorption of medications, vitamins, and minerals. After 6 weeks, as patient's diet returns to normal, encouraged adding 1 new food in per day, for a few days to monitor tolerance.  Expect good compliance

## 2016-09-09 MED ORDER — OXYCODONE HCL 5 MG PO TABS
15.0000 mg | ORAL_TABLET | ORAL | Status: DC | PRN
  Administered 2016-09-09 – 2016-09-11 (×7): 15 mg via ORAL
  Filled 2016-09-09 (×7): qty 3

## 2016-09-09 NOTE — Progress Notes (Signed)
3 Days Post-Op  Subjective: Patient feels well this morning. He does state that he had some chills last night but his MAXIMUM TEMPERATURE was 99 2 and this was verified with nursing. His abdominal pain is minimal and he has ostomy output.  Objective: Vital signs in last 24 hours: Temp:  [97.5 F (36.4 C)-99.2 F (37.3 C)] 98.2 F (36.8 C) (12/16 0555) Pulse Rate:  [81-104] 81 (12/16 0555) Resp:  [18-20] 20 (12/16 0555) BP: (111-133)/(75-94) 133/94 (12/16 0555) SpO2:  [98 %-100 %] 100 % (12/16 0555) Last BM Date: 09/08/16 (ostomy)  Intake/Output from previous day: 12/15 0701 - 12/16 0700 In: 3742.5 [P.O.:990; I.V.:2702.5; IV Piggyback:50] Out: 2600 [Urine:700; Drains:50; Stool:1850] Intake/Output this shift: Total I/O In: 1827.5 [P.O.:240; I.V.:1587.5] Out: 920 [Urine:300; Drains:20; Stool:600]  Physical exam:  Soft abdomen much less distended than admission. There is pus in the left drain serous fluid in the right drain and ostomy output is present Penrose drain is present on the midline incision. Calves are nontender  Lab Results: CBC   Recent Labs  09/07/16 0500 09/08/16 0530  WBC 10.8* 8.8  HGB 10.5* 10.6*  HCT 31.6* 31.1*  PLT 502* 463*   BMET  Recent Labs  09/07/16 0500 09/08/16 0530  NA 141 139  K 3.8 3.6  CL 111 109  CO2 27 23  GLUCOSE 131* 121*  BUN 10 10  CREATININE 0.89 1.04  CALCIUM 7.8* 8.2*   PT/INR No results for input(s): LABPROT, INR in the last 72 hours. ABG No results for input(s): PHART, HCO3 in the last 72 hours.  Invalid input(s): PCO2, PO2  Studies/Results: No results found.  Anti-infectives: Anti-infectives    Start     Dose/Rate Route Frequency Ordered Stop   09/05/16 1700  piperacillin-tazobactam (ZOSYN) IVPB 3.375 g     3.375 g 12.5 mL/hr over 240 Minutes Intravenous Every 8 hours 09/05/16 1654        Assessment/Plan: s/p Procedure(s): DRAINAGE OF PELVIC ABSCESS, ILEOSTOMY   Patient is doing better now his  documented temperature is 90.9 to max last night and afebrile the rest of the 24 hours. Plan is to continue IV antibiotics for now and slowly advance diet at the patient's preference. He is currently on a full liquid diet and we'll try to advance later today.  [name] E Vadhir Mcnay, MD, FACS  09/09/2016

## 2016-09-09 NOTE — Progress Notes (Signed)
Patient doing even better this afternoon. He had questions about some sacral and both laterals hips which appeared to be edematous. He has been laying in bed with fluid IV. He is had no nausea vomiting no fevers or chills is overall feeling better.  He has been afebrile Less purulence in drain. Patient is up and walking at this point.  Patient doing very well cultures of been reviewed and today no anaerobes have grown but Escherichia coli and Klebsiella oxytoca  were identified. Both of these organisms were sensitive to the current antibiotics and probably to changing to [employer] [place] tomorrow. She doing very well overall we'll consider discharge if IV antibiotics at home can be arranged on Monday.  I would leave the drains in place including the Penrose drain and remove those in the office in a week.

## 2016-09-10 ENCOUNTER — Inpatient Hospital Stay: Payer: Self-pay

## 2016-09-10 MED ORDER — SODIUM CHLORIDE 0.9 % IV SOLN
1.0000 g | Freq: Every day | INTRAVENOUS | Status: DC
  Filled 2016-09-10: qty 1

## 2016-09-10 MED ORDER — ALTEPLASE 2 MG IJ SOLR: 2.0000 mg | Freq: Once | INTRAMUSCULAR | Status: DC

## 2016-09-10 NOTE — Progress Notes (Signed)
Patient's feeling very well this afternoon he's been up and walking and tolerating a soft diet. He is ready to advance his diet. He is changing his own dressings his own ostomy bag and emptying his own drain.  Afebrile for roughly 48 hours at least. Abdomen is soft and minimally distended ostomy output is good pus and left-sided drain serous in right sided drain Penrose in place on the midline.  Patient doing very well cultures are reviewed Klebsiella and Escherichia coli noted.   I will discontinue the Zosyn and changed to Invanz every 24 in anticipation of potential home health IV antibiotic and discharge in the next day or 2. We'll advance diet as well

## 2016-09-10 NOTE — Consult Note (Signed)
2053.PICC CAPS CHANGED AND DRESSING REMOVED CHANGED DRESSING AND RETRACTED PICC TO BEING OUT 3 CM AT SKIN AS PER INSERTION RECORD. FLUSHES AND DRAWS WELL.  PICC DRESSING WAS CHANGED PRIOR TO MY ARRIVAL AND THE PICC WAS FOUND TO BE AT 0 CM AT THE SKIN ON MY INSPECTION. CHEST XRAY DONE TO CONFIRM CORRECT PLACEMENT. IT APPEARS TO BE IN DISTAL SVC  AND IN GOOD POST ION.

## 2016-09-10 NOTE — Progress Notes (Signed)
4 Days Post-Op  Subjective: Patient states that he is feeling better today every day seems to be a little bit better. He did have some sweats again last night but has not had a documented fever. He was able to change his own ostomy bag yesterday and is ready to advance his diet this morning.  Objective: Vital signs in last 24 hours: Temp:  [97.7 F (36.5 C)-98.4 F (36.9 C)] 97.7 F (36.5 C) (12/17 0556) Pulse Rate:  [72-108] 79 (12/17 0556) Resp:  [12-20] 16 (12/17 0556) BP: (118-139)/(81-94) 131/83 (12/17 0556) SpO2:  [98 %-100 %] 100 % (12/17 0556) Last BM Date: 09/09/16 (ostomy)  Intake/Output from previous day: 12/16 0701 - 12/17 0700 In: 481.7 [P.O.:120; IV Piggyback:361.7] Out: 2722.5 [Urine:1125; Drains:47.5; Stool:1550] Intake/Output this shift: Total I/O In: 361.7 [IV Piggyback:361.7] Out: 942.5 [Urine:425; Drains:17.5; Stool:500]  Physical exam:  Awake alert and oriented. Vital signs reviewed and stable as well as afebrile for 24 hours at least. Abdomen is soft and minimally distended serous fluid in ostomy bag that has just been changed. Viable ostomy pus in the left drain serous fluid in right drain.  Lab Results: CBC   Recent Labs  09/08/16 0530  WBC 8.8  HGB 10.6*  HCT 31.1*  PLT 463*   BMET  Recent Labs  09/08/16 0530  NA 139  K 3.6  CL 109  CO2 23  GLUCOSE 121*  BUN 10  CREATININE 1.04  CALCIUM 8.2*   PT/INR No results for input(s): LABPROT, INR in the last 72 hours. ABG No results for input(s): PHART, HCO3 in the last 72 hours.  Invalid input(s): PCO2, PO2  Studies/Results: No results found.  Anti-infectives: Anti-infectives    Start     Dose/Rate Route Frequency Ordered Stop   09/05/16 1700  piperacillin-tazobactam (ZOSYN) IVPB 3.375 g     3.375 g 12.5 mL/hr over 240 Minutes Intravenous Every 8 hours 09/05/16 1654        Assessment/Plan: s/p Procedure(s): DRAINAGE OF PELVIC ABSCESS, ILEOSTOMY   Patient is doing quite  well at this point. Cultures have been reviewed. I would like to change antibiotics to acute 24 regimen based on the culture results. If he is able to transition to a every 24 dosing schedule with a different antibiotic that we could proceed with attempting discharge on home IV antibiotics with PICC line tomorrow or Tuesday. His drains will remain in place and be removed in the office. I would consider a repeat CT scan as an outpatient as well.  [name] E Jermaine Neuharth, MD, FACS  09/10/2016

## 2016-09-10 NOTE — Progress Notes (Signed)
Spoke with Dr. Excell Seltzerooper about Matthew Chaney assessing and declotting patient's picc line. Dr.Cooper verbalized order to assess and declot picc line by Martiniquecarolina Chaney.  Anselm Junglingonyers,Dimitrios Balestrieri M

## 2016-09-10 NOTE — Progress Notes (Signed)
Received order from Dr. Orvis BrillLoflin to call [facility] to have them come and declot one of the lumen in the picc line.  Ordered Cathflo from pharmacy.  Discussed with patient but he is refusing the have the picc line declotted. Educated on benefits of having procedure but he is not willing to have this procedure done. Loel RoVerne H. Detric Scalisi, RN 09/10/16 at [phone]

## 2016-09-11 LAB — CBC WITH DIFFERENTIAL/PLATELET
BASOS PCT: 1 %
Basophils Absolute: 0 10*3/uL (ref 0–0.1)
Eosinophils Absolute: 0.2 10*3/uL (ref 0–0.7)
Eosinophils Relative: 3 %
HEMATOCRIT: 30.1 % — AB (ref 40.0–52.0)
HEMOGLOBIN: 10.5 g/dL — AB (ref 13.0–18.0)
LYMPHS ABS: 1.4 10*3/uL (ref 1.0–3.6)
LYMPHS PCT: 21 %
MCH: 28.5 pg (ref 26.0–34.0)
MCHC: 34.7 g/dL (ref 32.0–36.0)
MCV: 81.9 fL (ref 80.0–100.0)
MONOS PCT: 11 %
Monocytes Absolute: 0.7 10*3/uL (ref 0.2–1.0)
NEUTROS ABS: 4.3 10*3/uL (ref 1.4–6.5)
NEUTROS PCT: 64 %
Platelets: 435 10*3/uL (ref 150–440)
RBC: 3.68 MIL/uL — ABNORMAL LOW (ref 4.40–5.90)
RDW: 14.7 % — ABNORMAL HIGH (ref 11.5–14.5)
WBC: 6.7 10*3/uL (ref 3.8–10.6)

## 2016-09-11 LAB — AEROBIC/ANAEROBIC CULTURE W GRAM STAIN (SURGICAL/DEEP WOUND)

## 2016-09-11 LAB — AEROBIC/ANAEROBIC CULTURE (SURGICAL/DEEP WOUND)

## 2016-09-11 MED ORDER — SODIUM CHLORIDE 0.9 % IV SOLN: 1.0000 g | Freq: Three times a day (TID) | INTRAVENOUS | Status: DC

## 2016-09-11 MED ORDER — MEROPENEM-SODIUM CHLORIDE 1 GM/50ML IV SOLR
1.0000 g | Freq: Three times a day (TID) | INTRAVENOUS | Status: DC
  Administered 2016-09-11: 1 g via INTRAVENOUS
  Filled 2016-09-11 (×3): qty 50

## 2016-09-11 MED ORDER — SODIUM CHLORIDE 0.9 % IV SOLN: 1.0000 g | INTRAVENOUS | 0 refills | Status: DC

## 2016-09-11 MED ORDER — MEROPENEM-SODIUM CHLORIDE 1 GM/50ML IV SOLR
1.0000 g | Freq: Three times a day (TID) | INTRAVENOUS | 0 refills | Status: DC
Start: 2016-09-11 — End: 2016-10-26

## 2016-09-11 MED ORDER — OXYCODONE-ACETAMINOPHEN 5-325 MG PO TABS: 1.0000 | ORAL_TABLET | Freq: Four times a day (QID) | ORAL | 0 refills | Status: DC | PRN

## 2016-09-11 NOTE — Clinical Social Work Note (Signed)
CSW consulted for arranging home IV antibiotic therapy. RN CM notified as she would be the one to arrange home IV antibiotic services. Please reconsult CSW if needed. York SpanielMonica Kaislee Chao MSW,LCSW [phone]

## 2016-09-11 NOTE — Progress Notes (Addendum)
Patient discharge teaching given, including activity, diet, follow-up appoints, and medications. Patient verbalized understanding of all discharge instructions. PICC dressing clean and intact, 2 JP drains intact, ostomy and midline dressing has been changed. Home Health has been set up. Vitals are stable. Skin is intact except as charted in most recent assessments. Pt refused to be escorted, he is driven home by mother.  Hoyle Barkdull [place]

## 2016-09-11 NOTE — Consult Note (Signed)
WOC Nurse ostomy follow up Stoma type/location: RLQ ileostomy,  Patient, mother, cousin and friend at bedside for ostomy teaching session.  Patient was documented to have had changed pouch over weekend, had only emptied.  Pouch change and teaching session today prior to discharge. Stomal assessment/size: 1 and 3/4 inch loop ileostomy Peristomal assessment: Intact Treatment options for stomal/peristomal skin: skin barrier ring Output: light brown liquid effluent.  Patient has just finished lunch Ostomy pouching: 2pc. Pouching system, 2 and 1/4 inch with skin barrier ring Education provided: Patient and family educated on GI A&P, pouch and stoma characteristics.  Mother assisted in cutting out skin barrier after WOC nurse measures stoma that is functioning. Skin barrier rings stretched to fit ostomy and placed, skin barrier placed.  Patient assisted in snapping pouch to skin barrier and performing Lock and Roll Closure. Dem,onstrated emptying into toilet and cleaning bottom 2 inches of pouch with toilet paper wick.  Demonstrated that appearance of condensation on front of pouch over stoma is an indication of a good seal.  Patient asking questions about leakage, mother asking questions about diet. All instructed that patient will need to chew food thoroughly and drink plenty of fluids to replace those lost through pouch.  First week of pouch change frequency discussed so that patient wound not have to change pouch on Christmas Day (Monday).  Contact information provided. Additional supply provided (3 skin barrier rings, three skin barriers and 3 pouches) provided to supplement those left by my partner last week. [place] [facility] Dis[facility] [place]: Yes. WOC nursing team will continue to follow, and will remain available to this patient, the nursing, surgical and medical teams.   Thanks, Ladona MowLaurie Leylany Nored, MSN, RN, GNP, Hans EdenCWOCN, CWON-AP, FAAN  Pager# [phone]

## 2016-09-11 NOTE — Discharge Instructions (Signed)
End Ileostomy, Care After Introduction Refer to this sheet in the next few weeks. These instructions provide you with information about caring for yourself after your procedure. Your health care provider may also give you more specific instructions. Your treatment has been planned according to current medical practices, but problems sometimes occur. Call your health care provider if you have any problems or questions after your procedure. What can I expect after the procedure? After the procedure, it is common to have:  A small amount of blood or clear fluid leaking from your stoma.  Pain and discomfort in your abdomen, especially around your stoma.  Irregular bowel movements for several days.  Loose stool. Follow these instructions at home: Medicines  Take over-the-counter and prescription medicines only as told by your health care provider.  If you were prescribed an antibiotic medicine, take it as told by your health care provider. Do not stop taking the antibiotic even if you start to feel better. Stoma Care  Keep your stoma and the skin that surrounds it clean and dry.  Follow your health care providers instructions about how to take care of your stoma. It is important to:  Wash your hands with soap and water before you change your bandage (dressing). If soap and water are not available, use hand sanitizer.  Change your dressing as told by your health care provider.  Leave stitches (sutures), skin glue, or adhesive strips in place. In some cases, these skin closures may need to be in place for 2 weeks or longer. If adhesive strip edges start to loosen and curl up, you may trim the loose edges. Do not remove adhesive strips entirely unless your health care provider tells you to remove them.  Check your stoma area every day for signs of infection. Check for:  More redness, swelling, or pain.  More fluid or blood.  Warmth.  Pus or a bad smell.  Follow your health care  providers instructions about changing and cleaning your ostomy pouch.  Keep supplies with you at all times to care for your stoma and ostomy pouch. Eating and drinking  Follow instructions from your health care provider about eating or drinking restrictions.  Pay attention to which foods and drinks cause problems with digestion, such as gas, constipation, or diarrhea.  Avoid spicy foods and caffeine while your stoma heals.  Eat meals and snacks at regular intervals.  Drink enough fluid to keep your urine clear or pale yellow. Activity  Return to your normal activities as told by your health care provider. Ask your health care provider what activities are safe for you.  Rest as much as possible while your stoma heals.  Avoid intense physical activity for as long as you are told by your health care provider.  Do not lift anything that is heavier than 10 lb (4.5 kg) for 6 weeks or for as long as told by your health care provider. Driving  Do not drive for 24 hours if you received a sedative.  Do not drive or operate heavy machinery while taking prescription pain medicine. General instructions  Wear compression stockings as told by your health care provider. These stockings help to prevent blood clots and reduce swelling in your legs.  Do not take baths, swim, or use a hot tub until your health care provider approves.  Do not use any tobacco products, such as cigarettes, chewing tobacco, and e-cigarettes. If you need help quitting, ask your health care provider.  (Women) Talk with your health  care provider if you plan to become pregnant or if you take birth control pills.  Keep all follow-up visits as told by your health care provider. This is important. Contact a health care provider if:  You have more redness, swelling, or pain around your stoma.  You have more fluid or blood coming from your stoma.  Your stoma feels warm to the touch.  You have pus or a bad smell coming  from your stoma.  You have a fever.  You have loose stools that do not get firmer after several weeks.  You have bowel movements more often or less often than your health care provider tells you to expect.  You feel nauseous.  You vomit.  You have abdominal pain, bloating, pressure, or cramping.  You have problems with sexual activity.  You have an unusual lack of energy (fatigue).  You are unusually thirsty or you always have a dry mouth. Get help right away if:  You feel dizzy or light-headed.  You have pain or cramps in your abdomen that get worse or do not go away with medicine.  Your stoma suddenly changes size or color.  You have shortness of breath.  You have bleeding from your stoma that does not stop.  You vomit more than one time.  You faint.  You have internal tissue coming out of your stoma (prolapse).  You have an irregular heartbeat.  You have chest pain. This information is not intended to replace advice given to you by your health care provider. Make sure you discuss any questions you have with your health care provider. Document Released: 08/23/2015 Document Revised: 02/17/2016 Document Reviewed: 07/02/2015  2017 Elsevier

## 2016-09-11 NOTE — Progress Notes (Signed)
5 Days Post-Op   Subjective:  Patient reports continuing to feel better. He has been tolerating a diet and performing his own ostomy care. He is concerned about his PICC line only having one functional port.  Vital signs in last 24 hours: Temp:  [97.8 F (36.6 C)-98.3 F (36.8 C)] 98.3 F (36.8 C) (12/18 1201) Pulse Rate:  [72-107] 99 (12/18 1201) Resp:  [16-18] 18 (12/18 1201) BP: (115-131)/(78-94) 117/82 (12/18 1201) SpO2:  [94 %-100 %] 100 % (12/18 1201) Last BM Date: 09/11/16 (ostomy)  Intake/Output from previous day: 12/17 0701 - 12/18 0700 In: 410 [P.O.:360; IV Piggyback:50] Out: 2092.5 [Urine:1250; Drains:42.5; Stool:800]  GI: Abdomen is soft, appropriately tender to palpation at incision sites, nondistended. Midline well approximated with staples over a Penrose drain. Ileostomy in the right lower quadrant is pink, patent, productive of gas and stool. Bilateral JP drains in the pelvis draining a serous and was fluid.  Lab Results:  CBC  Recent Labs  09/11/16 0619  WBC 6.7  HGB 10.5*  HCT 30.1*  PLT 435   CMP     Component Value Date/Time   NA 139 09/08/2016 0530   NA 137 05/31/2014 1529   K 3.6 09/08/2016 0530   K 3.5 05/31/2014 1529   CL 109 09/08/2016 0530   CL 108 (H) 05/31/2014 1529   CO2 23 09/08/2016 0530   CO2 25 05/31/2014 1529   GLUCOSE 121 (H) 09/08/2016 0530   GLUCOSE 83 05/31/2014 1529   BUN 10 09/08/2016 0530   BUN 17 05/31/2014 1529   CREATININE 1.04 09/08/2016 0530   CREATININE 1.18 05/31/2014 1529   CALCIUM 8.2 (L) 09/08/2016 0530   CALCIUM 8.7 05/31/2014 1529   PROT 5.7 (L) 09/07/2016 0500   PROT 8.0 05/31/2014 1529   ALBUMIN 2.0 (L) 09/07/2016 0500   ALBUMIN 3.9 05/31/2014 1529   AST 14 (L) 09/07/2016 0500   AST 16 05/31/2014 1529   ALT 15 (L) 09/07/2016 0500   ALT 34 05/31/2014 1529   ALKPHOS 107 09/07/2016 0500   ALKPHOS 83 05/31/2014 1529   BILITOT 0.6 09/07/2016 0500   BILITOT 1.0 05/31/2014 1529   GFRNONAA >60 09/08/2016  0530   GFRNONAA >60 05/31/2014 1529   GFRAA >60 09/08/2016 0530   GFRAA >60 05/31/2014 1529   PT/INR No results for input(s): LABPROT, INR in the last 72 hours.  Studies/Results: Dg Chest Port 1 View  Result Date: 09/10/2016 CLINICAL DATA:  35 year old male with PICC placement. EXAM: PORTABLE CHEST 1 VIEW COMPARISON:  None. FINDINGS: A right-sided PICC with tip at the cavoatrial junction. Curvilinear density in the right hilum likely represent atelectasis/ scarring. The lungs are clear. There is no pleural effusion or pneumothorax. The cardiac silhouette is within normal limits with no acute osseous pathology. IMPRESSION: Right-sided PICC with tip at the cavoatrial junction. Electronically Signed   By: Elgie CollardArash  Radparvar M.D.   On: 09/10/2016 22:40    Assessment/Plan: 35 year old male status post drainage of pelvic abscess and ileostomy formation. Doing very well. Working with social work to set up home health for home IV antibiotics. Encourage ambulation, incentive spirometer usage, oral intake.   Ricarda Frameharles Vidalia Serpas, MD FACS General Surgeon  09/11/2016

## 2016-09-11 NOTE — Discharge Summary (Signed)
Patient ID: Matthew Chaney MRN: [id number] DOB/AGE: 03-Sep-1981 35 y.o.  Admit date: 09/05/2016 Discharge date: 09/11/2016  Discharge Diagnoses:  Pelvic Abscess  Procedures Performed: Ileostomy creation, drainage of pelvic abscess  Discharged Condition: good  Hospital Course: Patient taken to the OR for a pelvic abscess. Drainage was performed and an ileostomy was created. Tolerated the procedure well. Was able to be discharged home on IV antibiotics. On day of discharge he was tolerating a regular diet and his pain was well controlled.  Discharge Orders: Home  Disposition: 01-Home or Self Care  Discharge Medications: Allergies as of 09/11/2016      Reactions   Other Other (See Comments)   Seasonal allergies:  Sneezing, watery eyes, itchy throat. Seasonal allergies:  Sneezing, watery eyes, itchy throat.   Vicodin [hydrocodone-acetaminophen] Hives, Rash      Medication List    TAKE these medications   esomeprazole 20 MG capsule Commonly known as:  NEXIUM Take 20 mg by mouth daily at 12 noon.   meropenem 1 GM/50ML Solr Commonly known as:  MERREM Inject 50 mLs (1 g total) into the vein every 8 (eight) hours.   oxyCODONE-acetaminophen 5-325 MG tablet Commonly known as:  ROXICET Take 1 tablet by mouth every 6 (six) hours as needed for severe pain.        Follwup: Follow-up Information    Dionne Miloichard Cooper, MD Follow up on 09/22/2016.   Specialty:  Surgery Why:  F/u Dr. Orvis BrillLoflin on 12/22 at 2:45 Dr. Excell Seltzerooper on 12/29 at 9:15AM Contact information: [address] [place] [id number] [phone]           Signed: Ricarda FrameCharles Jerzie Chaney 09/11/2016, 4:43 PM

## 2016-09-12 ENCOUNTER — Telehealth: Payer: Self-pay

## 2016-09-12 MED ORDER — HYDROMORPHONE HCL 2 MG PO TABS: 2.0000 mg | ORAL_TABLET | ORAL | 0 refills | Status: DC | PRN

## 2016-09-12 MED ORDER — CYCLOBENZAPRINE HCL 10 MG PO TABS: 10.0000 mg | ORAL_TABLET | Freq: Three times a day (TID) | ORAL | 0 refills | Status: DC | PRN

## 2016-09-12 NOTE — Telephone Encounter (Signed)
Patient just got out of the hospital yesterday and is saying that the pain medication they sent him home with is not working and he is still in a lot of pain. Please call patient and advice

## 2016-09-12 NOTE — Telephone Encounter (Signed)
Spoke with Dr. Aleen CampiPiscoya in regards to patient. He did review chart in depth. He has prescribed Dilaudid 2mg  tablets- 1 tab q4h prn #30- 0RF for severe pain and also Cyclobenzaprine 10mg  tablets- 1 tab TID #30- 0RF scheduled for muscle spasms.  Medications ordered, printed, and signed. Ready for pick up in [place] office.  Call made to patient at this time whom will have this picked up after 1pm today in [place] location.

## 2016-09-14 NOTE — Care Management (Signed)
Late entry  [facility] and ostomy education.  Patient lives with his mother, and they will both be learning how to administer the home IV medication.  [facility].  Patient inquired how to apply for medicaid.  I informed him that he would need to follow up at department of social services.

## 2016-09-15 ENCOUNTER — Encounter: Payer: Self-pay | Admitting: Surgery

## 2016-09-15 ENCOUNTER — Ambulatory Visit (INDEPENDENT_AMBULATORY_CARE_PROVIDER_SITE_OTHER): Payer: Self-pay | Admitting: Surgery

## 2016-09-15 VITALS — BP 140/86 | HR 141 | Temp 98.1°F | Ht 65.0 in | Wt 166.6 lb

## 2016-09-15 DIAGNOSIS — K5732 Diverticulitis of large intestine without perforation or abscess without bleeding: Secondary | ICD-10-CM

## 2016-09-15 NOTE — Patient Instructions (Signed)
Please make sure that you are drinking a goal of 2 liters of water every single day. Continue to keep an eye on your heart rate to make sure it is around 100 or lower. If you feel like you need to drink, you are already dehydrated.  You may eat anything that you feel like.  Take Tylenol and Ibuprofen alternating every 4 hours around the clock to help with inflammation and pain. This will help to decrease the amount of pain medication that you are using. Reserve your Dilaudid for when you begin to have severe pain. Continue to use your Flexeril for spasms as needed as well.  If you continue to have problems with your ostomy not sealing after using these new supplies, please contact us so that we can call the ostomy nurse Maple HudsonKaren Sanders to set up a visit.  We have pulled one of your drains today, you will want to keep a dressing over this until it stops draining from the site. Do not be surprised if your drain out put from your other drain increases after pulling this drain today. Please use the drain record and cup given today to keep up with your existing drain. Measure to the nearest CC and then bring this with you to your next appointment on 09/22/16 with Dr. Excell Seltzerooper.  If you develop a fever/chills, nausea/vomiting, redness at any incision site, or increased pain in your abdomen; call our office immediately and speak with a nurse. If it is after hours, call the operator and ask for the surgeon on call [phone].

## 2016-09-15 NOTE — Progress Notes (Signed)
35 year old male who had a large abscess after having a hole and resection. The patient had an open drainage of the abscess on 12/13 with Dr. Excell Seltzerooper. The patient is doing okay at home and is getting his antibiotics still.  He has had a lot of leakage around his ileostomy and is getting very frustrated with this. The ostomy nurse Maple HudsonKaren Sanders has spoken with him and he does still have a home health nurses come by and help trouble shoot and some more supplies today. He has not had much come out of the right-sided JP drain just about 5 mL a day but is having somewhere between 25 and 50 mL of pus-looking drainage coming from the left-sided JP drain.  He has not had any fevers or chills and is otherwise doing well.  Vitals:   09/15/16 1447  BP: 140/86  Pulse: (!) 141  Temp: 98.1 F (36.7 C)   PE:  Gen: NAD Abd: soft, midline incision clean with minimal serous drainage in superior and inferior portion at penrose drain, JP on right serous, removed today, JP on left purulent drainage, ileostomy pink and patent.     A/P:  He is overall doing well however he is very dehydrated today. Part of this is from his frustration of having leakage around the ileostomy site. With the right JP drain removed hopefully we can get a better seal on the ileostomy again decrease this. He was instructed that he needs to drink about 2 L of water a day in order to keep up with his ileostomy output. The patient understands this and is going to continue. Is going to add in some Tylenol and ibuprofen to help with his inflammation and pain is continuing to use the Flexeril for muscle spasms and then the Dilaudid for more severe pain. He also was instructed that he could shower just to dry the area and re-places dressings afterwards. He will follow-up with Dr. Excell Seltzerooper on 1229.

## 2016-09-19 ENCOUNTER — Telehealth: Payer: Self-pay | Admitting: Surgery

## 2016-09-19 ENCOUNTER — Encounter: Payer: Self-pay | Admitting: Surgery

## 2016-09-19 NOTE — Telephone Encounter (Signed)
Patient has called and canceled appointment today with Dr Excell Seltzerooper. Patient states that the home healthcare nurse was able to get new ostomy supplies and that the ostomy is staying in place at this time. He states that once this was fixed he started drinking water and Gatorade and also eating. He states that another nurse is coming to check his vitals before the end of the day to make sure he does not seem dehydrated.   He has been added back to the schedule to see Dr Excell Seltzerooper on 09/22/16. Patient was advised to call our office if he has any other questions or concerns.   I have advised Dr Excell Seltzerooper with this information and Dr Excell Seltzerooper agrees with the plan to see him back in the office on 09/22/16.

## 2016-09-19 NOTE — Telephone Encounter (Signed)
I have spoke with Dr Excell Seltzerooper and he would like to see the patient at 2:00pm in the office.   I have advised patient of this appointment and he states that he has a nurse coming to bring new ostomy supplies. He does not know the exact time of when the nurse will come. He states that he knows he is very dehydrated bc he has refused to drink any liquids. I have advised him of how important it is to drink fluids and to arrive at his appointment with Dr Excell Seltzerooper.

## 2016-09-19 NOTE — Telephone Encounter (Signed)
Jerilynn MagesSherry Miles,nurse at [facility], left a voice message and stated that she needs to talk to a nurse regarding patient. He is having leakage  issues  And can't get his bag to stick. They need to increase his visits. Blood pressure this morning was anywhere from 100/60 to 90/60. She is wondering should he go to ER

## 2016-09-20 ENCOUNTER — Telehealth: Payer: Self-pay

## 2016-09-20 ENCOUNTER — Encounter: Payer: Self-pay | Admitting: Surgery

## 2016-09-20 NOTE — Telephone Encounter (Signed)
Cordelia PenSherry from [facility] called in [phone] with questions about Physician Orders. I did ask that all orders be faxed over for signature. All questions were answered.   She states that patient had labwork done and Sed Rate was elevated to 89 and she wanted to let Dr. Excell Seltzerooper know. She did fax over lab results and these will be reviewed with Dr. Excell Seltzerooper prior to patient's appointment on Friday.  Encouraged nurse to call back with any further questions.

## 2016-09-20 NOTE — Telephone Encounter (Signed)
Called patient to check on status. He states that he is doing much better and Ileostomy bag is staying intact after the visit with home health nurse. He has been drinking Gatorade and water with a goal of 2 liters daily. He reported heart rate of 112 and BP of 100/60 for today. Encouraged patient to keep up drinking and continue to eat as much protein as possible to aide in healing. He is scheduled to see Dr. Excell Seltzerooper on Friday but encouraged to call with any other questions or concerns prior to this appointment.

## 2016-09-22 ENCOUNTER — Encounter: Payer: Self-pay | Admitting: Surgery

## 2016-09-22 ENCOUNTER — Telehealth: Payer: Self-pay

## 2016-09-22 ENCOUNTER — Ambulatory Visit (INDEPENDENT_AMBULATORY_CARE_PROVIDER_SITE_OTHER): Payer: Self-pay | Admitting: Surgery

## 2016-09-22 VITALS — BP 130/87 | HR 141 | Temp 98.8°F | Ht 65.0 in | Wt 161.2 lb

## 2016-09-22 DIAGNOSIS — K5732 Diverticulitis of large intestine without perforation or abscess without bleeding: Secondary | ICD-10-CM

## 2016-09-22 DIAGNOSIS — K651 Peritoneal abscess: Secondary | ICD-10-CM

## 2016-09-22 MED ORDER — ERTAPENEM SODIUM 1 G IJ SOLR: 1.0000 g | INTRAMUSCULAR | Status: DC

## 2016-09-22 MED ORDER — OXYCODONE-ACETAMINOPHEN 7.5-325 MG PO TABS: 1.0000 | ORAL_TABLET | ORAL | 0 refills | Status: DC | PRN

## 2016-09-22 NOTE — Progress Notes (Signed)
Outpatient postop visit  09/22/2016  Leilani AbleJoshua A Dais is an 35 y.o. male.    Procedure: Drainage of pelvic abscess and ileostomy  CC: Bag leakage  HPI: Patient's major problem is bag leakage from the ileostomy. We home health nurse a day or 2 ago was concerned that he was dehydrated but he states he's eating well his appetite is coming back he's drinking well urinating normally and his ostomy is functioning well but the ostomy appliance does not stick very well.  He also had recent labs drawn which looked surprisingly normal with the exception of secondary thrombocytosis as well as an elevated sedimentation rate as might be expected with acute on chronic processes in the pelvis. He is not having any bowel movements. Via The rectum.  We had been offering the patient office visits all week but essentially had to bag him to come today to be visited and examined. We also asked the ostomy nurse from the hospital to come over to meet the patient to assist in appliance management.  Medications reviewed.    Physical Exam:  Ht 5\' 5"  (1.651 m)     PE: Heart rate is elevated but it has always been elevated I think that is secondary to anxiety as even when he was in the hospital it's been 130 or more. He is  afebrile with a normal blood pressure  Abdomen is soft nondistended nontympanitic and nontender ostomy is functional with bilious output. The midline wound is clean without purulence and the Penrose drain is removed. Resting's are placed over the open areas cephalad and caudad.  The JP drain no longer has purulence within it and is only serosanguineous.    Assessment/Plan:  This patient status post ileostomy and drainage of a pelvic abscess following colon resection for diverticulitis. I will leave the JP drain and at this time. I will renew his meropenem and address the question about Q24  versus Q8 with the pharmacy.. Currently he is doing quite well and in fact his labs drawn  yesterday demonstrate that he is maintaining a good nutritional status.  The ostomy service nurse arrived and we inspected the ostomy and it is pink and functional without difficulty. He is being counseled about management of his ostomy appliances by the ostomy nurse. His mother is present for this teaching.  I would like to renew his meropenem, refill his Percocet, and see him next week for reevaluation.  [name] E Cooper, MD, FACS

## 2016-09-22 NOTE — Telephone Encounter (Signed)
Spoke with patient at this time to explain that he is to decrease Meropenum to once daily for the next 10 days and then we will re-evaluate need for PICC line. This medication was confirmed receipt of order by [facility].   Call made to Cordelia PenSherry [phone] of [facility] at this time. No answer. Left voicemail for return phone call. Will touch base with nurse on Tuesday to explain changes in ostomy care, antibiotics, and wound care.

## 2016-09-22 NOTE — Patient Instructions (Signed)
I will be in touch with you as soon as I speak with the pharmacist about your antibiotic dosage.   I will speak with Cordelia PenSherry, your [facility] nurse, about the changes we have made today.  Continue to build up the bottom of your ostomy with the smaller barrier ring to form a "dam" at the bottom and ensure that you are warming the barrier rings prior to placing them on to make them more pliable and sticky. Also, remember to wear your belt all the time to keep the ostomy appliance on snugly.  Take your prescription to the pharmacy for your pain medication.  Keep your appointment with us next week and call with ANY questions or concerns prior to this.

## 2016-09-26 NOTE — Telephone Encounter (Signed)
Spoke with Cordelia PenSherry at this time from [facility]. She would like Meropenum sent to [facility] pharmacy so that they can get it out to patient's home. [facility] [phone]. Order was for Meropenum 1g Daily x 10 days.  Call made to patient at this time and explained everything above. Patient verbalizes understanding of this.  Also, checked on patient's status while on phone.  Patient is doing great with new ostomy appliance and technique that was shown by Maple HudsonKaren Sanders (ostomy nurse) at 12/29 appointment. He is drinking Gatorade and Water without difficulty. He is eating well per patient. Denies abdominal pain, nausea/vomiting. Ostomy output has been much better.   Encouraged to call with any further questions or concerns prior to Thursday appointment.

## 2016-09-27 ENCOUNTER — Encounter: Payer: Self-pay | Admitting: Surgery

## 2016-09-28 ENCOUNTER — Encounter: Payer: Self-pay | Admitting: Surgery

## 2016-09-29 ENCOUNTER — Telehealth: Payer: Self-pay

## 2016-09-29 NOTE — Telephone Encounter (Signed)
Called patient at this time to discuss abnormal liver enzymes on lab testing dated 09/26/16. No answer. Left voicemail for return phone call.

## 2016-09-29 NOTE — Telephone Encounter (Signed)
Spoke with patient at this time. Offered condolences for Grandmother's passing.   Then, we spoke about his elevated liver enzymes and increased potassium. Patient states that he has decreased Gatorade intake slightly to help with potassium per his home health nurse. He is not taking narcotic pain medication but has been taking Tylenol. I instructed patient to stop all Tylenol usage and not to partake in ETOH until labs may be rechecked and are at a normal level.

## 2016-10-02 ENCOUNTER — Other Ambulatory Visit: Payer: Self-pay | Admitting: General Surgery

## 2016-10-02 ENCOUNTER — Telehealth: Payer: Self-pay

## 2016-10-02 ENCOUNTER — Ambulatory Visit
Admission: RE | Admit: 2016-10-02 | Discharge: 2016-10-02 | Disposition: A | Discharge status: Home / Self Care | Payer: Self-pay | Source: Ambulatory Visit | Attending: General Surgery | Admitting: General Surgery

## 2016-10-02 DIAGNOSIS — M79605 Pain in left leg: Secondary | ICD-10-CM | POA: Insufficient documentation

## 2016-10-02 DIAGNOSIS — M79602 Pain in left arm: Secondary | ICD-10-CM | POA: Insufficient documentation

## 2016-10-02 NOTE — Telephone Encounter (Signed)
Patient states that he is having pain in Left arm and LLE x 2 days. Also states that he had a "pop" in his chest and had pain afterwards for 3-4 minutes but pain is reproduced with movement. Denies SOB or dyspnea. Denies redness or increased warmth to either extremity that is having pain. Has increased Incentive Spirometry use since this occurred and states that pain in chest is not constant but only occurs with movement now. Denies nausea/vomiting, fever/chills, or increased abdominal pain.  PICC line in right arm and patient states that 2 our of 3 ports are functional at this time and this has been flushed and dressing changed daily.  Will speak with Dr. Tonita CongWoodham and return phone call to patient once we have talked.

## 2016-10-02 NOTE — Telephone Encounter (Signed)
LUE and LLE venous ultrasound ordered. [facility] Patient is to arrive at 1245pm.  Patient was placed on schedule to see Dr. Tonita CongWoodham tomorrow morning at 11am per surgeon's request.  Returned phone call to patient and all appointment information given. He verbalizes understanding. Will call patient with results of US as soon as they are received.

## 2016-10-02 NOTE — Telephone Encounter (Signed)
Teresa Elliot,[facility]trasound [facility] called in with the ultrasound results for the patient. Both Leg and arm ultrasounds were negative for DVT.

## 2016-10-02 NOTE — Telephone Encounter (Signed)
Negative results were reviewed with the patient. Patient is relieved to hear this. He will continue with his appointment in the am as scheduled.

## 2016-10-02 NOTE — Telephone Encounter (Signed)
Matthew BootyJoshua called [facility]. Please call patient and advice.

## 2016-10-03 ENCOUNTER — Ambulatory Visit (INDEPENDENT_AMBULATORY_CARE_PROVIDER_SITE_OTHER): Payer: Self-pay | Admitting: General Surgery

## 2016-10-03 ENCOUNTER — Other Ambulatory Visit
Admission: RE | Admit: 2016-10-03 | Discharge: 2016-10-03 | Disposition: A | Discharge status: Home / Self Care | Payer: Self-pay | Source: Ambulatory Visit | Attending: General Surgery | Admitting: General Surgery

## 2016-10-03 ENCOUNTER — Telehealth: Payer: Self-pay

## 2016-10-03 ENCOUNTER — Encounter: Payer: Self-pay | Admitting: General Surgery

## 2016-10-03 VITALS — BP 108/73 | HR 128 | Temp 98.1°F | Ht 65.0 in | Wt 163.8 lb

## 2016-10-03 DIAGNOSIS — Z4889 Encounter for other specified surgical aftercare: Secondary | ICD-10-CM

## 2016-10-03 DIAGNOSIS — R7989 Other specified abnormal findings of blood chemistry: Secondary | ICD-10-CM

## 2016-10-03 DIAGNOSIS — R945 Abnormal results of liver function studies: Principal | ICD-10-CM

## 2016-10-03 DIAGNOSIS — T814XXA Infection following a procedure, initial encounter: Secondary | ICD-10-CM

## 2016-10-03 DIAGNOSIS — Y839 Surgical procedure, unspecified as the cause of abnormal reaction of the patient, or of later complication, without mention of misadventure at the time of the procedure: Secondary | ICD-10-CM | POA: Insufficient documentation

## 2016-10-03 DIAGNOSIS — T8140XA Infection following a procedure, unspecified, initial encounter: Secondary | ICD-10-CM

## 2016-10-03 LAB — COMPREHENSIVE METABOLIC PANEL
ALK PHOS: 151 U/L — AB (ref 38–126)
ALT: 72 U/L — AB (ref 17–63)
AST: 40 U/L (ref 15–41)
Albumin: 4.4 g/dL (ref 3.5–5.0)
Anion gap: 10 (ref 5–15)
BILIRUBIN TOTAL: 2.3 mg/dL — AB (ref 0.3–1.2)
BUN: 18 mg/dL (ref 6–20)
CALCIUM: 9.8 mg/dL (ref 8.9–10.3)
CHLORIDE: 105 mmol/L (ref 101–111)
CO2: 16 mmol/L — ABNORMAL LOW (ref 22–32)
CREATININE: 1 mg/dL (ref 0.61–1.24)
Glucose, Bld: 95 mg/dL (ref 65–99)
Potassium: 4 mmol/L (ref 3.5–5.1)
Sodium: 131 mmol/L — ABNORMAL LOW (ref 135–145)
Total Protein: 9.2 g/dL — ABNORMAL HIGH (ref 6.5–8.1)

## 2016-10-03 LAB — CBC WITH DIFFERENTIAL/PLATELET
BASOS ABS: 0 10*3/uL (ref 0–0.1)
Basophils Relative: 1 %
EOS PCT: 1 %
Eosinophils Absolute: 0 10*3/uL (ref 0–0.7)
HEMATOCRIT: 41.9 % (ref 40.0–52.0)
HEMOGLOBIN: 14.4 g/dL (ref 13.0–18.0)
LYMPHS ABS: 1.9 10*3/uL (ref 1.0–3.6)
Lymphocytes Relative: 44 %
MCH: 27.4 pg (ref 26.0–34.0)
MCHC: 34.3 g/dL (ref 32.0–36.0)
MCV: 79.7 fL — AB (ref 80.0–100.0)
Monocytes Absolute: 0.5 10*3/uL (ref 0.2–1.0)
Monocytes Relative: 11 %
NEUTROS ABS: 1.9 10*3/uL (ref 1.4–6.5)
NEUTROS PCT: 43 %
Platelets: 318 10*3/uL (ref 150–440)
RBC: 5.25 MIL/uL (ref 4.40–5.90)
RDW: 14.9 % — ABNORMAL HIGH (ref 11.5–14.5)
WBC: 4.3 10*3/uL (ref 3.8–10.6)

## 2016-10-03 LAB — BILIRUBIN, FRACTIONATED(TOT/DIR/INDIR)
BILIRUBIN DIRECT: 0.3 mg/dL (ref 0.1–0.5)
BILIRUBIN INDIRECT: 1.7 mg/dL — AB (ref 0.3–0.9)
BILIRUBIN TOTAL: 2 mg/dL — AB (ref 0.3–1.2)

## 2016-10-03 LAB — PHOSPHORUS: PHOSPHORUS: 3.8 mg/dL (ref 2.5–4.6)

## 2016-10-03 LAB — MAGNESIUM: MAGNESIUM: 1.4 mg/dL — AB (ref 1.7–2.4)

## 2016-10-03 MED ORDER — MAGNESIUM OXIDE 400 MG PO TABS
400.0000 mg | ORAL_TABLET | Freq: Every day | ORAL | 0 refills | Status: DC
Start: 2016-10-03 — End: 2017-02-14

## 2016-10-03 NOTE — Patient Instructions (Signed)
Please [facility] to have your labs drawn today. I will call you with results as soon as they are received.  We will have you follow-up as scheduled on 10/09/16 with Dr. Excell Seltzerooper.  Call with any questions or concerns prior to your appointment.

## 2016-10-03 NOTE — Progress Notes (Signed)
Outpatient Surgical Follow Up  10/03/2016  Matthew Chaney is an 36 y.o. male.   Chief Complaint  Patient presents with  . Routine Post Op    Laparoscopic With Ileostomy creation-Dr.Cooper-    HPI: 37 year old male returns to clinic for additional follow-up. He's been having left upper extremity and left lower extremity discomfort which prompted an outpatient DVT ultrasound. Results reveal shunt were negative. He states that the majority of his extremity discomfort has resolved. Continues to complain of some discomfort to his left lower rib cage. He states he has been eating well and having good ostomy output. He does continue to have some discomfort to his left anterior thigh but his posterior complaints have completely resolved. He denies any fevers, chills, nausea, vomiting, shortness of breath, changes in ostomy output. Continues to have a JP drain in place but states he has not keeping output records.  Past Medical History:  Diagnosis Date  . Anxiety   . Diverticulitis   . GERD (gastroesophageal reflux disease)     Past Surgical History:  Procedure Laterality Date  . COLON RESECTION SIGMOID N/A 08/15/2016   Procedure: COLON RESECTION SIGMOID;  Surgeon: Lattie Haw, MD;  Location: [facility];  Service: General;  Laterality: N/A;  . COLONOSCOPY WITH PROPOFOL N/A 07/13/2016   Procedure: COLONOSCOPY WITH PROPOFOL;  Surgeon: Midge Minium, MD;  Location[facility];  Service: Endoscopy;  Laterality: N/A;  . LAPAROTOMY N/A 09/06/2016   Procedure: DRAINAGE OF PELVIC ABSCESS, ILEOSTOMY;  Surgeon: Lattie Haw, MD;  Location: [facility];  Service: General;  Laterality: N/A;  . NOSE SURGERY  2010    Family History  Problem Relation Age of Onset  . Diverticulitis Father   . Breast cancer Maternal Grandmother     Social History:  reports that he has been smoking Cigars.  He has been smoking about 1.00 pack per day. He has never used smokeless tobacco. He reports that he  drinks alcohol. He reports that he does not use drugs.  Allergies:  Allergies  Allergen Reactions  . Other Other (See Comments)    Seasonal allergies:  Sneezing, watery eyes, itchy throat. Seasonal allergies:  Sneezing, watery eyes, itchy throat.  . Vicodin [Hydrocodone-Acetaminophen] Hives and Rash    Medications reviewed.    ROS A multipoint review of systems was completed. All pertinent positives and negatives were documented within the history of present illness and remainder are negative.   BP 108/73   Pulse (!) 128   Temp 98.1 F (36.7 C) (Oral)   Ht 5\' 5"  (1.651 m)   Wt 74.3 kg (163 lb 12.8 oz)   BMI 27.26 kg/m   Physical Exam Gen.: No acute distress Neck: Supple and nontender Chest: Clear to auscultation, mild tenderness on palpation of the lateral lower ribs on the left side without visible evidence of infection or palpable evidence of fracture. Heart: Tachycardic Abdomen: Soft, purple tender to palpation at the midline and ostomy and drain sites. No evidence of any wound breakdown. Staples in place the midline that appears to be well-healed. Ostomy present in the right lower quadrant that is pink, patent of gas and stool. Good seal on his ostomy appliance. JP drain in the left lower quadrant draining a serous sanguinous fluid with evidence of clot within the drain.    No results found for this or any previous visit (from the past 48 hour(s)). US Venous Img Lower Unilateral Left  Result Date: 10/02/2016 CLINICAL DATA:  36 year old male with upper  and lower extremity pain EXAM: LEFT LOWER EXTREMITY VENOUS DOPPLER ULTRASOUND TECHNIQUE: Gray-scale sonography with graded compression, as well as color Doppler and duplex ultrasound were performed to evaluate the lower extremity deep venous systems from the level of the common femoral vein and including the common femoral, femoral, profunda femoral, popliteal and calf veins including the posterior tibial, peroneal and  gastrocnemius veins when visible. The superficial great saphenous vein was also interrogated. Spectral Doppler was utilized to evaluate flow at rest and with distal augmentation maneuvers in the common femoral, femoral and popliteal veins. COMPARISON:  None. FINDINGS: Contralateral Common Femoral Vein: Respiratory phasicity is normal and symmetric with the symptomatic side. No evidence of thrombus. Normal compressibility. Common Femoral Vein: No evidence of thrombus. Normal compressibility, respiratory phasicity and response to augmentation. Saphenofemoral Junction: No evidence of thrombus. Normal compressibility and flow on color Doppler imaging. Profunda Femoral Vein: No evidence of thrombus. Normal compressibility and flow on color Doppler imaging. Femoral Vein: No evidence of thrombus. Normal compressibility, respiratory phasicity and response to augmentation. Popliteal Vein: No evidence of thrombus. Normal compressibility, respiratory phasicity and response to augmentation. Calf Veins: No evidence of thrombus. Normal compressibility and flow on color Doppler imaging. Superficial Great Saphenous Vein: No evidence of thrombus. Normal compressibility and flow on color Doppler imaging. Other Findings:  None. IMPRESSION: Sonographic survey of the left lower extremity negative for DVT. Signed, Yvone Neu. Loreta Ave, DO [facility] Electronically Signed   By: Gilmer Mor D.O.   On: 10/02/2016 14:09   US Venous Img Upper Uni Left  Result Date: 10/02/2016 CLINICAL DATA:  36 year old male with upper extremity pain EXAM: LEFT UPPER EXTREMITY VENOUS DOPPLER ULTRASOUND TECHNIQUE: Gray-scale sonography with graded compression, as well as color Doppler and duplex ultrasound were performed to evaluate the upper extremity deep venous system from the level of the subclavian vein and including the jugular, axillary, basilic, radial, ulnar and upper cephalic vein. Spectral  Doppler was utilized to evaluate flow at rest and with distal augmentation maneuvers. COMPARISON:  None. FINDINGS: Contralateral Subclavian Vein: Respiratory phasicity is normal and symmetric with the symptomatic side. No evidence of thrombus. Normal compressibility. Internal Jugular Vein: No evidence of thrombus. Normal compressibility, respiratory phasicity and response to augmentation. Subclavian Vein: No evidence of thrombus. Normal compressibility, respiratory phasicity and response to augmentation. Axillary Vein: No evidence of thrombus. Normal compressibility, respiratory phasicity and response to augmentation. Cephalic Vein: No evidence of thrombus. Normal compressibility, respiratory phasicity and response to augmentation. Basilic Vein: No evidence of thrombus. Normal compressibility, respiratory phasicity and response to augmentation. Brachial Veins: No evidence of thrombus. Normal compressibility, respiratory phasicity and response to augmentation. Radial Veins: No evidence of thrombus. Normal compressibility, respiratory phasicity and response to augmentation. Ulnar Veins: No evidence of thrombus. Normal compressibility, respiratory phasicity and response to augmentation. Other Findings:  None visualized. IMPRESSION: Sonographic survey of the left upper extremity negative for DVT Signed, Yvone Neu. Loreta Ave, DO [facility] Electronically Signed   By: Gilmer Mor D.O.   On: 10/02/2016 14:10    Assessment/Plan:  1. Aftercare following surgery 36 year old male status post loop ileostomy creation for pelvic abscess from a likely anastomotic leak. Doing very well. Staples removed and replaced with Steri-Strips without difficulty. Discussed removal of his JP drain and patient elects to leave it in place until he is seen by his operative surgeon next week. Also discussed that at some point he'll need a barium enema to confirm healing  of his  anastomosis.  2. Postoperative infection, initial encounter Patient with a history of postoperative infection. Doing very well. Plan to recheck labs today and for patient to continue his scheduled follow-up with his operative surgeon next week. - CBC with Differential; Future - Comprehensive metabolic panel; Future - Phosphorus; Future - Magnesium; Future - CBC with Differential - Comprehensive metabolic panel - Phosphorus - Magnesium     Ricarda Frameharles Heavyn Yearsley, MD FACS General Surgeon  10/03/2016,12:26 PM

## 2016-10-03 NOTE — Telephone Encounter (Signed)
Reviewed all labs with patient at this time. Explained that add on testing has been requested for better look at his Liver Function. I encouraged patient to look in mirror each day to look for yellow skin or eyes and he is to call if this occurs. We will replace Magnesium with 400mg  Max-Ox Daily per Dr. Tonita CongWoodham and this has been called to [facility] and patient has been instructed to pick this up and begin today. Patient verbalizes all information given.  [facility], [phone]. All lab information and magnesium replacement was given to her. I refused to give verbal order for PICC line removal until patient's appointment with Dr. Excell Seltzerooper on Monday. This will be decided at that time. Patient's last dose of Meropenum is 10/04/16 per Nurse.

## 2016-10-04 ENCOUNTER — Telehealth: Payer: Self-pay

## 2016-10-04 NOTE — Telephone Encounter (Signed)
Patient called with some questions. Please call and advice.

## 2016-10-04 NOTE — Telephone Encounter (Signed)
Returned phone call at this time. No answer. Left voicemail for return phone call.

## 2016-10-05 NOTE — Telephone Encounter (Signed)
error 

## 2016-10-06 ENCOUNTER — Ambulatory Visit: Payer: Self-pay

## 2016-10-09 ENCOUNTER — Telehealth: Payer: Self-pay

## 2016-10-09 ENCOUNTER — Ambulatory Visit (INDEPENDENT_AMBULATORY_CARE_PROVIDER_SITE_OTHER): Payer: Self-pay | Admitting: Surgery

## 2016-10-09 ENCOUNTER — Other Ambulatory Visit
Admission: RE | Admit: 2016-10-09 | Discharge: 2016-10-09 | Disposition: A | Discharge status: Home / Self Care | Payer: Self-pay | Source: Ambulatory Visit | Attending: Surgery | Admitting: Surgery

## 2016-10-09 ENCOUNTER — Encounter: Payer: Self-pay | Admitting: Surgery

## 2016-10-09 VITALS — BP 142/83 | HR 112 | Temp 98.7°F | Ht 65.0 in | Wt 165.8 lb

## 2016-10-09 DIAGNOSIS — R7989 Other specified abnormal findings of blood chemistry: Secondary | ICD-10-CM | POA: Insufficient documentation

## 2016-10-09 DIAGNOSIS — T814XXA Infection following a procedure, initial encounter: Secondary | ICD-10-CM

## 2016-10-09 DIAGNOSIS — T8140XA Infection following a procedure, unspecified, initial encounter: Secondary | ICD-10-CM

## 2016-10-09 DIAGNOSIS — R945 Abnormal results of liver function studies: Principal | ICD-10-CM

## 2016-10-09 LAB — COMPREHENSIVE METABOLIC PANEL
ALBUMIN: 4.8 g/dL (ref 3.5–5.0)
ALT: 51 U/L (ref 17–63)
AST: 33 U/L (ref 15–41)
Alkaline Phosphatase: 128 U/L — ABNORMAL HIGH (ref 38–126)
Anion gap: 9 (ref 5–15)
BUN: 16 mg/dL (ref 6–20)
CHLORIDE: 108 mmol/L (ref 101–111)
CO2: 19 mmol/L — AB (ref 22–32)
CREATININE: 1.11 mg/dL (ref 0.61–1.24)
Calcium: 9.8 mg/dL (ref 8.9–10.3)
GFR calc Af Amer: >60 mL/min (ref >60)
GLUCOSE: 90 mg/dL (ref 65–99)
POTASSIUM: 3.8 mmol/L (ref 3.5–5.1)
SODIUM: 136 mmol/L (ref 135–145)
Total Bilirubin: 1 mg/dL (ref 0.3–1.2)
Total Protein: 9.1 g/dL — ABNORMAL HIGH (ref 6.5–8.1)

## 2016-10-09 NOTE — Progress Notes (Signed)
Outpatient postop visit  10/09/2016  Matthew AbleJoshua A Grigg is an 36 y.o. male.    Procedure: Ileostomy and drainage of pelvic abscess  CC: Minimal pain  HPI: Records reviewed from previous outpatient visits as well as labs. Patient is feeling well at this point. He has no fevers or chills he is gaining weight is eating well and handling his ileostomy quite well. His drain is putting out very minimal serous fluid  Medications reviewed.    Physical Exam:  There were no vitals taken for this visit.    PE: Serous fluid in drain abdomen soft nontender wound healed well no erythema no drainage ostomy functional    Assessment/Plan:  Patient doing very well at this point JP drain is removed and dressing is placed. I would like to see him back in 3 or 4 days and we can remove the PICC line. I would also like to see him back in 2 weeks and consider a CT scan at that point.  [name] E Lev Cervone, MD, FACS

## 2016-10-09 NOTE — Telephone Encounter (Signed)
Patient states that he does not have copayment today. I explained to patient that he is in post-op period for 90 days and does not need to have co-payment. He is on his way to office visit at this time.

## 2016-10-09 NOTE — Telephone Encounter (Signed)
Reviewed patient's labs with patient. All of his questions were answered. We will see him back in office as scheduled on Thursday, 10/12/16.

## 2016-10-09 NOTE — Telephone Encounter (Signed)
Patient would like for Amber to give him a call before his appointment. He has some questions. Please call and advice.

## 2016-10-12 ENCOUNTER — Encounter: Payer: Self-pay | Admitting: Surgery

## 2016-10-16 ENCOUNTER — Encounter: Payer: Self-pay | Admitting: Surgery

## 2016-10-17 ENCOUNTER — Telehealth: Payer: Self-pay

## 2016-10-17 NOTE — Telephone Encounter (Signed)
Spoke with patient at this time. Explained that Dr. Excell Seltzerooper would like to get a CT Scan of his Abdomen on Monday next week prior to following up in office on Thursday. Patient agrees with this plan.  While I was speaking to patient Home Health Nurse, Roanna RaiderSherri was at the residence. She asked for a verbal order to d/c PICC line. This order was given verbally by myself on behalf of Dr. Excell Seltzerooper. She states that she will ensure that patient is ok ordering all of his own ostomy supplies and she will discharge patient next week from Home Health.

## 2016-10-19 ENCOUNTER — Other Ambulatory Visit: Payer: Self-pay

## 2016-10-19 DIAGNOSIS — T814XXA Infection following a procedure, initial encounter: Principal | ICD-10-CM

## 2016-10-19 DIAGNOSIS — IMO0001 Reserved for inherently not codable concepts without codable children: Secondary | ICD-10-CM

## 2016-10-19 DIAGNOSIS — K651 Peritoneal abscess: Principal | ICD-10-CM

## 2016-10-19 NOTE — Telephone Encounter (Signed)
CT Scan scheduled in [facility] on Tuesday, 1/30 at 0900. Patient will need to pick up prep prior to scan. Nothing by mouth 4 hours prior to exam.  Explained all information to patient at this time. Patient is feeling great, no complaints at all, ostomy still working wonderfully and has been sealing without difficulty. Patient states that he feels the best that he has in months.   Will follow-up with patient as scheduled on 10/26/16 with Dr. Excell Seltzerooper.

## 2016-10-24 ENCOUNTER — Ambulatory Visit
Admission: RE | Admit: 2016-10-24 | Discharge: 2016-10-24 | Disposition: A | Discharge status: Home / Self Care | Payer: Self-pay | Source: Ambulatory Visit | Attending: Surgery | Admitting: Surgery

## 2016-10-24 DIAGNOSIS — K5730 Diverticulosis of large intestine without perforation or abscess without bleeding: Secondary | ICD-10-CM | POA: Insufficient documentation

## 2016-10-24 DIAGNOSIS — K651 Peritoneal abscess: Secondary | ICD-10-CM | POA: Insufficient documentation

## 2016-10-24 DIAGNOSIS — X58XXXA Exposure to other specified factors, initial encounter: Secondary | ICD-10-CM | POA: Insufficient documentation

## 2016-10-24 DIAGNOSIS — T814XXA Infection following a procedure, initial encounter: Secondary | ICD-10-CM | POA: Insufficient documentation

## 2016-10-24 DIAGNOSIS — IMO0001 Reserved for inherently not codable concepts without codable children: Secondary | ICD-10-CM

## 2016-10-24 MED ORDER — IOPAMIDOL (ISOVUE-300) INJECTION 61%
100.0000 mL | Freq: Once | INTRAVENOUS | Status: AC | PRN
  Administered 2016-10-24: 100 mL via INTRAVENOUS

## 2016-10-25 ENCOUNTER — Telehealth: Payer: Self-pay

## 2016-10-25 NOTE — Telephone Encounter (Signed)
Patient called this morning wanting to know his CT Scan results and to let us know that he is having pain on his colostomy bag site. He stated that his stoma is "bigger" than normal and that his colostomy bag doesn't want to stick due to the irritation he has on his skin. He stated that he tried applying powder to the site to help the bag stick but that it wouldn't. He wanted to know what else to do and if there was a possibility that Dr. Excell Seltzerooper could see him today instead of tomorrow. I told him that I would have to speak with Dr. Excell Seltzerooper first and that I would let him know of what is going on. I told him that I would call him back with recommendations or if Dr. Excell Seltzerooper was able to see him today. Patient understood and stated that he would wait for my call.

## 2016-10-26 ENCOUNTER — Ambulatory Visit (INDEPENDENT_AMBULATORY_CARE_PROVIDER_SITE_OTHER): Payer: Self-pay | Admitting: Surgery

## 2016-10-26 ENCOUNTER — Encounter: Payer: Self-pay | Admitting: Surgery

## 2016-10-26 VITALS — BP 116/84 | HR 109 | Temp 98.2°F | Ht 65.0 in | Wt 172.0 lb

## 2016-10-26 DIAGNOSIS — Z4889 Encounter for other specified surgical aftercare: Secondary | ICD-10-CM

## 2016-10-26 NOTE — Telephone Encounter (Signed)
Called Dr. Excell Seltzerooper several times at the Operating Room and was not able get in touch with him. However, I informed patient that since he was coming in on 10/26/2016, then we would have to wait.

## 2016-10-26 NOTE — Patient Instructions (Signed)
We will see you back in 1 month as scheduled below.  If you need ANYTHING at all or have any questions or concerns, please call Lamonte Hartt the nurse at the above number.

## 2016-10-27 NOTE — Progress Notes (Signed)
Outpatient postop visit  10/27/2016  Matthew Chaney is an 36 y.o. male.    Procedure: ileostomy  ZO:[id number]:none  HPI: Patient status post ileostomy for anastomotic leak. He has become more comfortable with the utilization of his appliances. He is gaining weight and feeling quite well denies fevers or chills and no abdominal pain  Medications reviewed.    Physical Exam:  BP 116/84   Pulse (!) 109   Temp 98.2 F (36.8 C) (Oral)   Ht 5\' 5"  (1.651 m)   Wt 172 lb (78 kg)   BMI 28.62 kg/m     PE: Ostomy functional abdomen is soft nontender nondistended nontympanitic no erythema no drainage    Assessment/Plan: Patient doing very well CT scan has been personally reviewed showing a sub-centimeters fluid collection not amenable to drainage nor is it necessary with the patient's clinical condition improving. He is gaining weight and having no fevers. We'll see him in a month and probably discuss barium enema later in April or May with closure around that time.  [name] E [name], MD, FACS

## 2016-10-31 ENCOUNTER — Telehealth: Payer: Self-pay

## 2016-10-31 NOTE — Telephone Encounter (Signed)
Patient called in having skin breakdown problems at ostomy site. Notification has been sent to Maple HudsonKaren Sanders (ostomy nurse) and awaiting reply to return phone call to patient.

## 2016-11-01 NOTE — Telephone Encounter (Signed)
Spoke with Maple HudsonKaren Sanders and she will meet with patient tomorrow at 12pm.  Notified patient that he is scheduled to meet with [name] in our office tomorrow at 12pm and he is to bring a sample of ostomy supplies. He verbalizes understanding.

## 2016-11-01 NOTE — Telephone Encounter (Signed)
Matthew Chaney is calling because he has some questions from [facility]. Please call patient and advice

## 2016-11-09 ENCOUNTER — Telehealth: Payer: Self-pay

## 2016-11-09 MED ORDER — NYSTATIN 100000 UNIT/GM EX POWD: Freq: Four times a day (QID) | CUTANEOUS | 1 refills | Status: DC

## 2016-11-09 NOTE — Telephone Encounter (Signed)
   Matthew Chaney is calling because he is still in a lot of pain. [name] has been helping him a lot with his colostomy bag but he tried changing it and it started to hurt. He describes the pain as a burning/itching pain. He states that when he stands up, it feels like the bag is pulling and tucking. Please call patient and advice.

## 2016-11-09 NOTE — Telephone Encounter (Signed)
Spoke with Maple HudsonKaren Sanders at this time. She states that at patient's last visit, she discussed the potential need for Nystatin powder in the place of stoma powder to help heal the area of inflammation/yeast infection of skin. She asked that this be sent in for patient at this time. She discussed this with Dr. Excell Seltzerooper during visit.  Medication sent to preferred pharmacy.  Call made to patient and explained above information. Patient verbalizes understanding and will call back in for update on Monday.

## 2016-11-11 ENCOUNTER — Encounter: Payer: Self-pay | Admitting: Surgery

## 2016-11-13 ENCOUNTER — Telehealth: Payer: Self-pay

## 2016-11-13 MED ORDER — TRAMADOL HCL 50 MG PO TABS: 50.0000 mg | ORAL_TABLET | Freq: Four times a day (QID) | ORAL | 0 refills | Status: DC | PRN

## 2016-11-13 NOTE — Telephone Encounter (Signed)
Called patient to let him know that Dr. Excell Seltzerooper would come in to sign a prescription for him. In addition, Dr. Excell Seltzerooper wants to see the patient on Wednesday to check up on how he is doing. Prescription will be left at the front desk.

## 2016-11-13 NOTE — Telephone Encounter (Signed)
Discussed this situation with Dr. Excell Seltzerooper. He has ordered Tramadol 50mg  tablet- 1 tab q6h PRN and would like to see patient this Wednesday. Appointment scheduled. Prescription has been written and signed.  Appointment is 11/15/16 at 1015am. Arrival at 1000am.  Call made to patient at this time. No answer. Left voicemail for return phone call.

## 2016-11-13 NOTE — Telephone Encounter (Signed)
Patient is aware that prescription is ready and states that he will come by to pick up prescription tomorrow morning. Prescription left at front desk for patient.

## 2016-11-13 NOTE — Telephone Encounter (Signed)
Matthew LodgeJoshua Helbig called returning the nurses call. I reviewed the documented information with him. He is aware of his medication that was sent to the pharmacy and his appointment date/time.

## 2016-11-13 NOTE — Telephone Encounter (Signed)
Patient called in with complaints of increased pain at stoma site and an increased size of stoma throughout the day each day. He is also working on some seal issues with his ostomy but has new supplies ordered to help with this part of the problem. He has used Nystatin as prescribed to help with the pain but states that this medication burns and he is not using this as directed. He has been using max dosage of both Tylenol and Ibuprofen without obtaining pain relief each day for the last 4 days and would like to know what else can be done.  I explained to patient that I would speak with Dr. Excell Seltzerooper and get back to him in regards to his pain.

## 2016-11-14 NOTE — Telephone Encounter (Signed)
Matthew Chaney came in to pick up his prescription.

## 2016-11-15 ENCOUNTER — Ambulatory Visit (INDEPENDENT_AMBULATORY_CARE_PROVIDER_SITE_OTHER): Payer: Self-pay | Admitting: Surgery

## 2016-11-15 ENCOUNTER — Encounter: Payer: Self-pay | Admitting: Surgery

## 2016-11-15 VITALS — BP 129/80 | HR 95 | Temp 98.0°F | Ht 65.0 in | Wt 172.2 lb

## 2016-11-15 DIAGNOSIS — K651 Peritoneal abscess: Secondary | ICD-10-CM

## 2016-11-15 NOTE — Patient Instructions (Signed)
You are doing GREAT!  We will see you back in 1 month.   Please call with any questions or concerns.

## 2016-11-15 NOTE — Progress Notes (Signed)
Outpatient postop visit  11/15/2016  Matthew Chaney is an 36 y.o. male.    Procedure: Colon resection and ileostomy  CC: Peristomal pain  HPI: Patient called in earlier this week complaining of peristomal pain and requesting pain medications. He was given tramadol. He was asked to return to the office for evaluation. He talks about fullness of his ostomy and also his chief complaint is really itching and burning around his ileostomy on the skin. He's had tried multiple things and also is seeing in speaking with the ostomy nurse periodically. Fevers or chills and is eating very well    Medications reviewed.    Physical Exam:  There were no vitals taken for this visit.    PE: Healthy-appearing male patient abdomen is soft nontender healing scar with no erythema or drainage ostomy is functional protuberant as expected and designed. There is no sign of a parastomal hernia abdomen otherwise soft and nontender    Assessment/Plan:  CT scan is again personally reviewed and I see no sign of a parastomal hernia although his history suggests that the swelling may be related to a parastomal hernia I see no sign of that on CT scan at this time. There is also no sign of a parastomal hernia on physical exam. I did recommend that he continue discussing his ostomy appliance problems with the ostomy nurse as they have so much more experience in dealing with this problem. He will follow-up with me in 1 month and we will discuss closure of his ileostomy and sigmoidoscopy at the same time. We would also plan a barium enema prior.  [name] E Doll Frazee, MD, FACS

## 2016-11-28 ENCOUNTER — Emergency Department
Admission: EM | Admit: 2016-11-28 | Discharge: 2016-11-28 | Disposition: A | Discharge status: Home / Self Care | Payer: Self-pay

## 2016-11-28 ENCOUNTER — Telehealth: Payer: Self-pay | Admitting: Surgery

## 2016-11-29 NOTE — Telephone Encounter (Signed)
Phone call returned to patient at this time. No answer. Left voicemail asking for return phone call.

## 2016-11-29 NOTE — Telephone Encounter (Signed)
Call made to patient once again at this time. No answer. Left voicemail for return phone call.    I do see where patient was registered in the Emergency Department yesterday, however, he left prior to being triaged by a nurse.   I will be glad to speak with patient should he return my calls.

## 2016-12-05 ENCOUNTER — Telehealth: Payer: Self-pay

## 2016-12-05 NOTE — Telephone Encounter (Signed)
Patient calls in today and states that he was scheduled to go to court yesterday but was having difficulty getting his ostomy to seal. He is now requesting a note for his court absence yesterday.   I explained to the patient that he is scheduled to see Dr Excell Seltzerooper in office tomorrow and this will need to be discussed at his appointment directly with the surgeon.  The patient verbalizes understanding of this.

## 2016-12-06 ENCOUNTER — Encounter: Payer: Self-pay | Admitting: Surgery

## 2016-12-06 ENCOUNTER — Ambulatory Visit (INDEPENDENT_AMBULATORY_CARE_PROVIDER_SITE_OTHER): Payer: Self-pay | Admitting: Surgery

## 2016-12-06 VITALS — BP 125/92 | HR 109 | Temp 98.5°F | Ht 65.0 in | Wt 176.6 lb

## 2016-12-06 DIAGNOSIS — K5732 Diverticulitis of large intestine without perforation or abscess without bleeding: Secondary | ICD-10-CM

## 2016-12-06 DIAGNOSIS — Z4889 Encounter for other specified surgical aftercare: Secondary | ICD-10-CM

## 2016-12-06 NOTE — Progress Notes (Signed)
Outpatient postop visit  12/06/2016  Matthew Chaney is an 36 y.o. male.    Procedure: Colon resection, ileostomy  CC: Peri stomal burning  HPI: Patient is gaining weight and generally feeling better but he has occasional pain and has some burning around his ostomy. He really wants to be closed at some point soon. He canceled his vacation in hopes of advancing this timeframe.  Medications reviewed.    Physical Exam:  There were no vitals taken for this visit.    PE: No acute distress soft nontender abdomen ostomy is functional and well constructed. No erosions around the ostomy. No sign of hernia.    Assessment/Plan:  Patient doing very well at this point he has canceled his vacation or wants to proceed with closure. He is 3 months out I will perform a CT scan and personally review that and then decide upon timing of barium enema and closure. Then with the results and plan.  [name] E Nivia Gervase, MD, FACS

## 2016-12-06 NOTE — Patient Instructions (Addendum)
We have scheduled you for a CT Scan of your Abdomen and Pelvis. This has been scheduled at [facility] on 12/12/16 at 1000am. Arrive at 0945am.  You will need to pick up a prep kit at least 24 hours in advance of your Scan: You may pick this up at the [place] at [facility], [address] Location, or [employer].  Bring a list of medications with you to your appointment.  Nothing to eat or drink 4 hours prior to your CT Scan.   If you need to reschedule your Scan, you may do so by calling [phone].

## 2016-12-12 ENCOUNTER — Ambulatory Visit
Admission: RE | Admit: 2016-12-12 | Discharge: 2016-12-12 | Disposition: A | Discharge status: Home / Self Care | Payer: Self-pay | Source: Ambulatory Visit | Attending: Surgery | Admitting: Surgery

## 2016-12-12 ENCOUNTER — Telehealth: Payer: Self-pay

## 2016-12-12 DIAGNOSIS — K5730 Diverticulosis of large intestine without perforation or abscess without bleeding: Secondary | ICD-10-CM | POA: Insufficient documentation

## 2016-12-12 DIAGNOSIS — K5732 Diverticulitis of large intestine without perforation or abscess without bleeding: Secondary | ICD-10-CM

## 2016-12-12 DIAGNOSIS — Z932 Ileostomy status: Secondary | ICD-10-CM | POA: Insufficient documentation

## 2016-12-12 MED ORDER — IOPAMIDOL (ISOVUE-300) INJECTION 61%
100.0000 mL | Freq: Once | INTRAVENOUS | Status: AC | PRN
  Administered 2016-12-12: 100 mL via INTRAVENOUS

## 2016-12-12 NOTE — Telephone Encounter (Signed)
Patient had a CT done this morning and wants to know what the results are. Please call patient and advice.

## 2016-12-12 NOTE — Telephone Encounter (Signed)
Notified patient of CT results. Amber to [employer] to arrange Barium Enema study once she has spoken with DR.Cooper. I let Josh know Amber would call him next week when this has been scheduled.   Josh stated he has an appointment with [name] (ostomy nurse 12/13/16 due to having issues with his Ostomy.

## 2016-12-13 ENCOUNTER — Telehealth: Payer: Self-pay

## 2016-12-13 ENCOUNTER — Other Ambulatory Visit: Payer: Self-pay | Admitting: Surgery

## 2016-12-13 DIAGNOSIS — K5732 Diverticulitis of large intestine without perforation or abscess without bleeding: Secondary | ICD-10-CM

## 2016-12-13 NOTE — Telephone Encounter (Signed)
I have reviewed results of CT Scan with Dr. Excell Seltzerooper. He would like to obtain a Barium Enema next week and have the patient follow-up in the office afterwards.   Orders placed for BE.  [facility] Check in at 0845am. Clear liquids the day prior. NPO after MN prior to exam.  Information given to patient. Appointment made to follow-up 01/10/17.   Patient encouraged to call back with any other questions or concerns that he may have. He verbalizes understanding.

## 2016-12-14 ENCOUNTER — Telehealth: Payer: Self-pay | Admitting: Surgery

## 2016-12-14 MED ORDER — TRAMADOL HCL 50 MG PO TABS: 50.0000 mg | ORAL_TABLET | Freq: Four times a day (QID) | ORAL | 0 refills | Status: DC | PRN

## 2016-12-14 MED ORDER — CYCLOBENZAPRINE HCL 5 MG PO TABS: 5.0000 mg | ORAL_TABLET | Freq: Three times a day (TID) | ORAL | 0 refills | Status: DC | PRN

## 2016-12-14 NOTE — Telephone Encounter (Signed)
Pt would like Amber to return his call.  He is dealing with a lot of pain.

## 2016-12-14 NOTE — Telephone Encounter (Signed)
Spoke with patient at this time. 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 Denies any other problems at this time. He states that Ultram has helped some on the past. He has tried Ibuprofen and Tylenol, neither has decreased the pain. Addressed this with Dr. Tonita CongWoodham at this time. He has ordered a refill on patient's Ultram as well as Cyclobenzaprine.   Orders placed.  I explained to patient about new medications and that he should not drive or drink Alcohol while taking either one. He verbalizes understanding and [facility].

## 2016-12-25 ENCOUNTER — Ambulatory Visit
Admission: RE | Admit: 2016-12-25 | Discharge: 2016-12-25 | Disposition: A | Discharge status: Home / Self Care | Payer: Self-pay | Source: Ambulatory Visit | Attending: Surgery | Admitting: Surgery

## 2016-12-25 DIAGNOSIS — K5732 Diverticulitis of large intestine without perforation or abscess without bleeding: Secondary | ICD-10-CM | POA: Insufficient documentation

## 2016-12-26 ENCOUNTER — Telehealth: Payer: Self-pay

## 2016-12-26 NOTE — Telephone Encounter (Signed)
Call made to patient at this time to review results of Barium Enema. No answer. Left voicemail for return phone call.  If patient returns phone call, Barium Enema is encouraging to do reversal. No further orders needed at this time. Patient is to follow-up as scheduled with Dr. Excell Seltzer on 01/10/17.

## 2016-12-26 NOTE — Telephone Encounter (Signed)
I read the documented note below to the patient. He had some questions about his diverticulitis but will ask Dr. Excell Seltzer on his appointment.

## 2017-01-10 ENCOUNTER — Ambulatory Visit (INDEPENDENT_AMBULATORY_CARE_PROVIDER_SITE_OTHER): Payer: Self-pay | Admitting: Surgery

## 2017-01-10 ENCOUNTER — Encounter: Payer: Self-pay | Admitting: Surgery

## 2017-01-10 VITALS — BP 127/88 | HR 112 | Temp 97.8°F | Ht 65.0 in | Wt 179.4 lb

## 2017-01-10 DIAGNOSIS — K5732 Diverticulitis of large intestine without perforation or abscess without bleeding: Secondary | ICD-10-CM

## 2017-01-10 NOTE — Patient Instructions (Signed)
Your surgery is scheduled for 02-07-17 at [facility] with Dr.Cooper. Please see your Blue pre-care sheet for surgery information. Please call our office if you have any questions or concerns.

## 2017-01-10 NOTE — Progress Notes (Signed)
Outpatient Surgical Follow Up  01/10/2017  Matthew Chaney is an 36 y.o. male.   CC: Diverticulitis  HPI: This a patient with diverticulitis who underwent a colon resection and experienced a leak from his colorcolonic anastomosis. He then underwent drainage of an abscess and an ileostomy placement. Currently he is doing well he has regained his strength in his appetite and is maintaining a good diet. He wishes to have his ileostomy reversed. He has had a follow-up CT scan and a barium enema showing no sign of leak and no sign of stricture. He is also had a colonoscopy.  Past Medical History:  Diagnosis Date  . Anxiety   . Diverticulitis   . GERD (gastroesophageal reflux disease)     Past Surgical History:  Procedure Laterality Date  . COLON RESECTION SIGMOID N/A 08/15/2016   Procedure: COLON RESECTION SIGMOID;  Surgeon: Lattie Haw, MD;  Location: [facility] ORS;  Service: General;  Laterality: N/A;  . COLONOSCOPY WITH PROPOFOL N/A 07/13/2016   Procedure: COLONOSCOPY WITH PROPOFOL;  Surgeon: Midge Minium, MD;  Location: [facility];  Service: Endoscopy;  Laterality: N/A;  . LAPAROTOMY N/A 09/06/2016   Procedure: DRAINAGE OF PELVIC ABSCESS, ILEOSTOMY;  Surgeon: Lattie Haw, MD;  Location: [facility] ORS;  Service: General;  Laterality: N/A;  . NOSE SURGERY  2010    Family History  Problem Relation Age of Onset  . Diverticulitis Father   . Breast cancer Maternal Grandmother     Social History:  reports that he has been smoking Cigars.  He has been smoking about 1.00 pack per day. He has never used smokeless tobacco. He reports that he drinks alcohol. He reports that he does not use drugs.  Allergies:  Allergies  Allergen Reactions  . Other Other (See Comments)    Seasonal allergies:  Sneezing, watery eyes, itchy throat. Seasonal allergies:  Sneezing, watery eyes, itchy throat.  . Vicodin [Hydrocodone-Acetaminophen] Hives and Rash    Medications reviewed.   Review of  Systems:   Review of Systems  Constitutional: Negative.   HENT: Negative.   Eyes: Negative.   Respiratory: Negative.   Cardiovascular: Negative.   Gastrointestinal: Negative.   Genitourinary: Negative.   Musculoskeletal: Negative.   Skin: Negative.   Neurological: Negative.   Endo/Heme/Allergies: Negative.   Psychiatric/Behavioral: Negative.      Physical Exam:  BP 127/88   Pulse (!) 112   Temp 97.8 F (36.6 C) (Oral)   Ht  (1.651 m)   Wt 179 lb 6.4 oz (81.4 kg)   BMI 29.85 kg/m   Physical Exam  Constitutional: He is oriented to person, place, and time and well-developed, well-nourished, and in no distress. No distress.  HENT:  Head: Normocephalic and atraumatic.  Eyes: Pupils are equal, round, and reactive to light. Right eye exhibits no discharge. Left eye exhibits no discharge. No scleral icterus.  Cardiovascular: Normal rate.   Pulmonary/Chest: Effort normal. No respiratory distress.  Abdominal: Soft. He exhibits no distension. There is no tenderness. There is no rebound and no guarding.  Midline scar well-healed no hernia Right lower quadrant ileostomy functional without difficulty  Musculoskeletal: Normal range of motion. He exhibits no edema.  Neurological: He is alert and oriented to person, place, and time.  Skin: Skin is warm and dry. He is not diaphoretic. No erythema.  Psychiatric: Mood and affect normal.  Vitals reviewed.     No results found for this or any previous visit (from the past 48 hour(s)). No results  found.  Assessment/Plan:  Here to discuss ileostomy closure. He has undergone all the necessary testing. He is diet is back to normal and he has gained weight. I recommended closure of his ileostomy via the ileostomy site. The rationale for this was discussed as was the options of observation and the risk of bleeding infection anastomotic leak requiring additional surgery including additional ostomies. This is all discussed with he and his  mother. We also discussed the findings of further extensive diverticulosis all the way to his right colon. I reminded him that the only way to completely reverse this would be to remove his entire colon and leave him with an ileostomy. He understands that we would not be doing that and that his risk for future diverticulitis is something higher than 0 because of the presence of the diverticulosis but somewhat unpredictable. He had undergone an extended sigmoid and left hemicolectomy before and his splenic flexures clearly been mobilized previously. All questions were answered for them  Lattie Haw, MD, FACS

## 2017-01-12 ENCOUNTER — Telehealth: Payer: Self-pay

## 2017-01-12 NOTE — Telephone Encounter (Signed)
Patient has been advised of Surgery Date as well as Pre-Admission appointment date, time, and location.  Surgery Date: 02/07/17  Pre-admit Appointment: 01/31/17 at 1045am  Patient has been advised to call [phone] the day before surgery between 1-3pm to obtain arrival time.

## 2017-01-12 NOTE — Telephone Encounter (Signed)
No insurance listed in chart. Patient is self pay for CPT 44620, ICD10- K57.32 and surgery (Ileostomy Takedown) date is 02/07/17.

## 2017-01-31 ENCOUNTER — Encounter
Admission: RE | Admit: 2017-01-31 | Discharge: 2017-01-31 | Disposition: A | Discharge status: Home / Self Care | Payer: Self-pay | Source: Ambulatory Visit | Attending: Surgery | Admitting: Surgery

## 2017-01-31 DIAGNOSIS — Z01812 Encounter for preprocedural laboratory examination: Secondary | ICD-10-CM | POA: Insufficient documentation

## 2017-01-31 DIAGNOSIS — K219 Gastro-esophageal reflux disease without esophagitis: Secondary | ICD-10-CM | POA: Insufficient documentation

## 2017-01-31 LAB — COMPREHENSIVE METABOLIC PANEL
ALK PHOS: 159 U/L — AB (ref 38–126)
ALT: 158 U/L — ABNORMAL HIGH (ref 17–63)
ANION GAP: 8 (ref 5–15)
AST: 196 U/L — ABNORMAL HIGH (ref 15–41)
Albumin: 4.5 g/dL (ref 3.5–5.0)
BILIRUBIN TOTAL: 1.1 mg/dL (ref 0.3–1.2)
BUN: 13 mg/dL (ref 6–20)
CO2: 20 mmol/L — AB (ref 22–32)
CREATININE: 1.09 mg/dL (ref 0.61–1.24)
Calcium: 9.5 mg/dL (ref 8.9–10.3)
Chloride: 111 mmol/L (ref 101–111)
GFR calc non Af Amer: >60 mL/min (ref >60)
Glucose, Bld: 87 mg/dL (ref 65–99)
Potassium: 3.5 mmol/L (ref 3.5–5.1)
SODIUM: 139 mmol/L (ref 135–145)
Total Protein: 8.6 g/dL — ABNORMAL HIGH (ref 6.5–8.1)

## 2017-01-31 LAB — CBC WITH DIFFERENTIAL/PLATELET
Basophils Absolute: 0 10*3/uL (ref 0–0.1)
Basophils Relative: 1 %
EOS PCT: 3 %
Eosinophils Absolute: 0.2 10*3/uL (ref 0–0.7)
HCT: 45.1 % (ref 40.0–52.0)
HEMOGLOBIN: 15.2 g/dL (ref 13.0–18.0)
LYMPHS ABS: 1.7 10*3/uL (ref 1.0–3.6)
Lymphocytes Relative: 32 %
MCH: 28.5 pg (ref 26.0–34.0)
MCHC: 33.7 g/dL (ref 32.0–36.0)
MCV: 84.5 fL (ref 80.0–100.0)
MONOS PCT: 11 %
Monocytes Absolute: 0.6 10*3/uL (ref 0.2–1.0)
Neutro Abs: 2.9 10*3/uL (ref 1.4–6.5)
Neutrophils Relative %: 53 %
Platelets: 290 10*3/uL (ref 150–440)
RBC: 5.34 MIL/uL (ref 4.40–5.90)
RDW: 14.4 % (ref 11.5–14.5)
WBC: 5.4 10*3/uL (ref 3.8–10.6)

## 2017-01-31 LAB — SURGICAL PCR SCREEN
MRSA, PCR: NEGATIVE
STAPHYLOCOCCUS AUREUS: NEGATIVE

## 2017-01-31 MED ORDER — CHLORHEXIDINE GLUCONATE CLOTH 2 % EX PADS
6.0000 | MEDICATED_PAD | Freq: Once | CUTANEOUS | Status: DC
  Filled 2017-01-31: qty 6

## 2017-01-31 NOTE — Pre-Procedure Instructions (Signed)
Anesthesia notified regarding elevated liver enzymes, faxed labs to Dr Excell Seltzerooper.

## 2017-01-31 NOTE — Patient Instructions (Addendum)
Your procedure is scheduled on: 02/07/17 WED Report to Same Day Surgery 2nd floor medical mall [facility](Medical Mall Entrance-take elevator on left to 2nd floor.  Check in with surgery information desk.) To find out your arrival time please call [phone] between 1PM - 3PM on 02/06/17 Tues Remember: Instructions that are not followed completely may result in serious medical risk, up to and including death, or upon the discretion of your surgeon and anesthesiologist your surgery may need to be rescheduled.    _x___ 1. Do not eat food or drink liquids after midnight. No gum chewing or                              hard candies.     __x__ 2. No Alcohol for 24 hours before or after surgery.   __x__3. No Smoking for 24 prior to surgery.   ____  4. Bring all medications with you on the day of surgery if instructed.    __x__ 5. Notify your doctor if there is any change in your medical condition     (cold, fever, infections).     Do not wear jewelry, make-up, hairpins, clips or nail polish.  Do not wear lotions, powders, or perfumes. You may wear deodorant.  Do not shave 48 hours prior to surgery. Men may shave face and neck.  Do not bring valuables to the hospital.    [facility] not responsible for any belongings or valuables.               Contacts, dentures or bridgework may not be worn into surgery.  Leave your suitcase in the car. After surgery it may be brought to your room.  For patients admitted to the hospital, discharge time is determined by your                       treatment team.   Patients discharged the day of surgery will not be allowed to drive home.  You will need someone to drive you home and stay with you the night of your procedure.    Please read over the following fact sheets that you were given:   [facility] Preparing for [facility] and or MRSA Information   _x___ Take anti-hypertensive (unless it includes a diuretic), cardiac, seizure, asthma,     anti-reflux and  psychiatric medicines. These include:  1. esomeprazole (NEXIUM 24HR  2.  3.  4.  5.  6.  _x___Fleets enema or Magnesium Citrate as directed.   _x___ Use CHG Soap or sage wipes as directed on instruction sheet   ____ Use inhalers on the day of surgery and bring to hospital day of surgery  ____ Stop Metformin and Janumet 2 days prior to surgery.    ____ Take 1/2 of usual insulin dose the night before surgery and none on the morning     surgery.   _x___ Follow recommendations from Cardiologist, Pulmonologist or PCP regarding          stopping Aspirin, Coumadin, Pllavix ,Eliquis, Effient, or Pradaxa, and Pletal.  X____Stop Anti-inflammatories such as Advil, Aleve, Ibuprofen, Motrin, Naproxen, Naprosyn, Goodies powders or aspirin products. OK to take Tylenol and                          Celebrex.   _x___ Stop supplements until after surgery.  But may continue Vitamin D, Vitamin  B,       and multivitamin.   ____ Bring C-Pap to the hospital.

## 2017-02-05 ENCOUNTER — Telehealth: Payer: Self-pay | Admitting: General Practice

## 2017-02-05 DIAGNOSIS — R7989 Other specified abnormal findings of blood chemistry: Secondary | ICD-10-CM

## 2017-02-05 DIAGNOSIS — R945 Abnormal results of liver function studies: Principal | ICD-10-CM

## 2017-02-05 NOTE — Telephone Encounter (Signed)
Patient is calling asking if he has to have his Liver enzymes rechecked before surgery or the day of his surgery, He is also asking if its okay to use to soap on his stomach. Patient is having surgery on 02/07/17. Please call patient and advice.

## 2017-02-05 NOTE — Telephone Encounter (Signed)
Spoke with patient on 02/02/17 in regards to his elevated LFT's on his pre-op labs. He states that he was having a bad day before his labs were drawn and had been drinking alcohol heavily the night before and had taken Percocet while drinking and this is why he feels his Liver Enzymes were elevated.   Spoke with Dr. Tonita CongWoodham (Surgeon on Call) after speaking with the patient and explained reasoning that patient feels LFT's were elevated. I also reviewed LFT's with Dr. Tonita CongWoodham. He would like for patient to come in on 02/06/17 and have labs repeated to make sure that this is indeed the cause of elevation in labs.  Returned phone call to patient today and explained that he would need to come in tomorrow to repeat these labs and that he is to avoid drinking any ETOH or taking Tylenol at all prior to labs being taken. Patient is in agreement.   Orders placed.

## 2017-02-06 ENCOUNTER — Telehealth: Payer: Self-pay

## 2017-02-06 ENCOUNTER — Telehealth: Payer: Self-pay | Admitting: Surgery

## 2017-02-06 ENCOUNTER — Other Ambulatory Visit
Admission: RE | Admit: 2017-02-06 | Discharge: 2017-02-06 | Disposition: A | Discharge status: Home / Self Care | Payer: Self-pay | Source: Ambulatory Visit | Attending: General Surgery | Admitting: General Surgery

## 2017-02-06 DIAGNOSIS — R945 Abnormal results of liver function studies: Secondary | ICD-10-CM

## 2017-02-06 DIAGNOSIS — R7989 Other specified abnormal findings of blood chemistry: Secondary | ICD-10-CM | POA: Insufficient documentation

## 2017-02-06 LAB — COMPREHENSIVE METABOLIC PANEL
ALT: 63 U/L (ref 17–63)
AST: 33 U/L (ref 15–41)
Albumin: 4.2 g/dL (ref 3.5–5.0)
Alkaline Phosphatase: 133 U/L — ABNORMAL HIGH (ref 38–126)
Anion gap: 7 (ref 5–15)
BUN: 16 mg/dL (ref 6–20)
CHLORIDE: 110 mmol/L (ref 101–111)
CO2: 21 mmol/L — ABNORMAL LOW (ref 22–32)
CREATININE: 1.16 mg/dL (ref 0.61–1.24)
Calcium: 9.2 mg/dL (ref 8.9–10.3)
Glucose, Bld: 94 mg/dL (ref 65–99)
Potassium: 3.4 mmol/L — ABNORMAL LOW (ref 3.5–5.1)
Sodium: 138 mmol/L (ref 135–145)
Total Bilirubin: 1.2 mg/dL (ref 0.3–1.2)
Total Protein: 8 g/dL (ref 6.5–8.1)

## 2017-02-06 MED ORDER — SODIUM CHLORIDE 0.9 % IV SOLN
1.0000 g | INTRAVENOUS | Status: AC
  Administered 2017-02-07: 1 g via INTRAVENOUS
  Filled 2017-02-06: qty 1

## 2017-02-06 NOTE — Telephone Encounter (Signed)
Spoke with patient at this time. I answered all of patient's questions to the best of my ability.   Results were reviewed of repeat labs.   He is encouraged to call back with any further questions or concerns prior to surgery tomorrow.

## 2017-02-06 NOTE — Telephone Encounter (Signed)
Patient would like to discuss surgical instructions and inform you that he had his labs drawn this morning.

## 2017-02-06 NOTE — Telephone Encounter (Signed)
Patient called requesting a letter to fax to his attorney since he has court scheduled for tomorrow. However, he will be having surgery on 02/07/2017. I told him that I would fax it.  AttorneyJenne Pane: John Cox Fax: [phone]

## 2017-02-07 ENCOUNTER — Inpatient Hospital Stay
Admission: RE | Admit: 2017-02-07 | Discharge: 2017-02-11 | DRG: 331 | Disposition: A | Discharge status: Home / Self Care | Payer: Self-pay | Source: Ambulatory Visit | Attending: Surgery | Admitting: Surgery

## 2017-02-07 ENCOUNTER — Encounter
Admission: RE | Disposition: A | Discharge status: Home / Self Care | Payer: Self-pay | Source: Ambulatory Visit | Attending: Surgery

## 2017-02-07 ENCOUNTER — Inpatient Hospital Stay: Payer: Self-pay | Admitting: Anesthesiology

## 2017-02-07 ENCOUNTER — Encounter: Payer: Self-pay | Admitting: *Deleted

## 2017-02-07 DIAGNOSIS — K5730 Diverticulosis of large intestine without perforation or abscess without bleeding: Secondary | ICD-10-CM | POA: Diagnosis present

## 2017-02-07 DIAGNOSIS — Z432 Encounter for attention to ileostomy: Principal | ICD-10-CM

## 2017-02-07 DIAGNOSIS — F17200 Nicotine dependence, unspecified, uncomplicated: Secondary | ICD-10-CM | POA: Diagnosis present

## 2017-02-07 DIAGNOSIS — K219 Gastro-esophageal reflux disease without esophagitis: Secondary | ICD-10-CM | POA: Diagnosis present

## 2017-02-07 HISTORY — PX: ILEOSTOMY CLOSURE: SHX1784

## 2017-02-07 LAB — CREATININE, SERUM
CREATININE: 1.07 mg/dL (ref 0.61–1.24)
GFR calc Af Amer: >60 mL/min (ref >60)
GFR calc non Af Amer: >60 mL/min (ref >60)

## 2017-02-07 LAB — CBC
HEMATOCRIT: 43.5 % (ref 40.0–52.0)
HEMOGLOBIN: 14.8 g/dL (ref 13.0–18.0)
MCH: 28.7 pg (ref 26.0–34.0)
MCHC: 34.1 g/dL (ref 32.0–36.0)
MCV: 84.2 fL (ref 80.0–100.0)
Platelets: 234 10*3/uL (ref 150–440)
RBC: 5.16 MIL/uL (ref 4.40–5.90)
RDW: 14.5 % (ref 11.5–14.5)
WBC: 8.2 10*3/uL (ref 3.8–10.6)

## 2017-02-07 SURGERY — CLOSURE, ILEOSTOMY
Anesthesia: General | Wound class: Clean Contaminated

## 2017-02-07 MED ORDER — ONDANSETRON HCL 4 MG/2ML IJ SOLN: 4.0000 mg | Freq: Once | INTRAMUSCULAR | Status: DC | PRN

## 2017-02-07 MED ORDER — SUGAMMADEX SODIUM 200 MG/2ML IV SOLN
INTRAVENOUS | Status: AC
  Filled 2017-02-07: qty 2

## 2017-02-07 MED ORDER — FENTANYL CITRATE (PF) 250 MCG/5ML IJ SOLN
INTRAMUSCULAR | Status: AC
  Filled 2017-02-07: qty 5

## 2017-02-07 MED ORDER — LIDOCAINE HCL (CARDIAC) 20 MG/ML IV SOLN
INTRAVENOUS | Status: DC | PRN
  Administered 2017-02-07: 80 mg via INTRAVENOUS

## 2017-02-07 MED ORDER — MIDAZOLAM HCL 2 MG/2ML IJ SOLN
INTRAMUSCULAR | Status: AC
  Filled 2017-02-07: qty 2

## 2017-02-07 MED ORDER — FLEET ENEMA 7-19 GM/118ML RE ENEM: 1.0000 | ENEMA | Freq: Once | RECTAL | Status: DC

## 2017-02-07 MED ORDER — HYDROMORPHONE HCL 1 MG/ML IJ SOLN
1.0000 mg | INTRAMUSCULAR | Status: DC | PRN
  Administered 2017-02-07 – 2017-02-11 (×16): 1 mg via INTRAVENOUS
  Filled 2017-02-07 (×17): qty 1

## 2017-02-07 MED ORDER — HEPARIN SODIUM (PORCINE) 5000 UNIT/ML IJ SOLN
INTRAMUSCULAR | Status: AC
  Filled 2017-02-07: qty 1

## 2017-02-07 MED ORDER — PNEUMOCOCCAL VAC POLYVALENT 25 MCG/0.5ML IJ INJ
0.5000 mL | INJECTION | INTRAMUSCULAR | Status: AC
  Administered 2017-02-10: 0.5 mL via INTRAMUSCULAR
  Filled 2017-02-07: qty 0.5

## 2017-02-07 MED ORDER — OXYCODONE-ACETAMINOPHEN 5-325 MG PO TABS
1.0000 | ORAL_TABLET | ORAL | Status: DC | PRN
  Administered 2017-02-07 – 2017-02-11 (×15): 2 via ORAL
  Filled 2017-02-07 (×15): qty 2

## 2017-02-07 MED ORDER — FENTANYL CITRATE (PF) 100 MCG/2ML IJ SOLN
INTRAMUSCULAR | Status: AC
  Administered 2017-02-07: 25 ug via INTRAVENOUS
  Filled 2017-02-07: qty 2

## 2017-02-07 MED ORDER — DEXAMETHASONE SODIUM PHOSPHATE 10 MG/ML IJ SOLN
INTRAMUSCULAR | Status: DC | PRN
  Administered 2017-02-07: 10 mg via INTRAVENOUS

## 2017-02-07 MED ORDER — CELECOXIB 200 MG PO CAPS
ORAL_CAPSULE | ORAL | Status: AC
  Filled 2017-02-07: qty 2

## 2017-02-07 MED ORDER — SUGAMMADEX SODIUM 200 MG/2ML IV SOLN
INTRAVENOUS | Status: DC | PRN
  Administered 2017-02-07: 160 mg via INTRAVENOUS

## 2017-02-07 MED ORDER — FENTANYL CITRATE (PF) 100 MCG/2ML IJ SOLN
INTRAMUSCULAR | Status: DC | PRN
  Administered 2017-02-07 (×3): 50 ug via INTRAVENOUS
  Administered 2017-02-07 (×2): 100 ug via INTRAVENOUS

## 2017-02-07 MED ORDER — DEXTROSE-NACL 5-0.9 % IV SOLN
INTRAVENOUS | Status: DC
  Administered 2017-02-07 – 2017-02-08 (×5): via INTRAVENOUS

## 2017-02-07 MED ORDER — METOPROLOL TARTRATE 5 MG/5ML IV SOLN
INTRAVENOUS | Status: AC
  Filled 2017-02-07: qty 5

## 2017-02-07 MED ORDER — ONDANSETRON HCL 4 MG/2ML IJ SOLN
4.0000 mg | Freq: Four times a day (QID) | INTRAMUSCULAR | Status: DC | PRN
Start: 2017-02-07 — End: 2017-02-11
  Administered 2017-02-10 – 2017-02-11 (×2): 4 mg via INTRAVENOUS
  Filled 2017-02-07 (×2): qty 2

## 2017-02-07 MED ORDER — HEPARIN SODIUM (PORCINE) 5000 UNIT/ML IJ SOLN
5000.0000 [IU] | Freq: Once | INTRAMUSCULAR | Status: AC
  Administered 2017-02-07: 5000 [IU] via SUBCUTANEOUS

## 2017-02-07 MED ORDER — PANTOPRAZOLE SODIUM 40 MG IV SOLR
40.0000 mg | Freq: Two times a day (BID) | INTRAVENOUS | Status: DC
  Administered 2017-02-07 – 2017-02-11 (×9): 40 mg via INTRAVENOUS
  Filled 2017-02-07 (×12): qty 40

## 2017-02-07 MED ORDER — BUPIVACAINE-EPINEPHRINE (PF) 0.25% -1:200000 IJ SOLN
INTRAMUSCULAR | Status: DC | PRN
  Administered 2017-02-07: 30 mL via PERINEURAL

## 2017-02-07 MED ORDER — HYDROMORPHONE HCL 1 MG/ML IJ SOLN
0.2500 mg | INTRAMUSCULAR | Status: DC | PRN
  Administered 2017-02-07 (×4): 0.5 mg via INTRAVENOUS

## 2017-02-07 MED ORDER — ROCURONIUM BROMIDE 50 MG/5ML IV SOLN
INTRAVENOUS | Status: AC
  Filled 2017-02-07: qty 1

## 2017-02-07 MED ORDER — CELECOXIB 200 MG PO CAPS
400.0000 mg | ORAL_CAPSULE | ORAL | Status: AC
  Administered 2017-02-07: 400 mg via ORAL

## 2017-02-07 MED ORDER — ONDANSETRON HCL 4 MG PO TABS: 4.0000 mg | ORAL_TABLET | Freq: Four times a day (QID) | ORAL | Status: DC | PRN

## 2017-02-07 MED ORDER — HYDROMORPHONE HCL 1 MG/ML IJ SOLN
INTRAMUSCULAR | Status: AC
  Administered 2017-02-07: 0.5 mg via INTRAVENOUS
  Filled 2017-02-07: qty 1

## 2017-02-07 MED ORDER — ACETAMINOPHEN NICU IV SYRINGE 10 MG/ML
INTRAVENOUS | Status: AC
  Filled 2017-02-07: qty 1

## 2017-02-07 MED ORDER — FENTANYL CITRATE (PF) 100 MCG/2ML IJ SOLN
INTRAMUSCULAR | Status: AC
  Filled 2017-02-07: qty 2

## 2017-02-07 MED ORDER — HEPARIN SODIUM (PORCINE) 5000 UNIT/ML IJ SOLN
5000.0000 [IU] | Freq: Three times a day (TID) | INTRAMUSCULAR | Status: DC
  Administered 2017-02-07 – 2017-02-11 (×11): 5000 [IU] via SUBCUTANEOUS
  Filled 2017-02-07 (×11): qty 1

## 2017-02-07 MED ORDER — METOPROLOL TARTRATE 5 MG/5ML IV SOLN
INTRAVENOUS | Status: DC | PRN
  Administered 2017-02-07 (×2): 2.5 mg via INTRAVENOUS

## 2017-02-07 MED ORDER — MIDAZOLAM HCL 2 MG/2ML IJ SOLN
INTRAMUSCULAR | Status: DC | PRN
  Administered 2017-02-07: 2 mg via INTRAVENOUS

## 2017-02-07 MED ORDER — BUPIVACAINE-EPINEPHRINE (PF) 0.25% -1:200000 IJ SOLN
INTRAMUSCULAR | Status: AC
  Filled 2017-02-07: qty 30

## 2017-02-07 MED ORDER — DEXAMETHASONE SODIUM PHOSPHATE 10 MG/ML IJ SOLN
INTRAMUSCULAR | Status: AC
  Filled 2017-02-07: qty 1

## 2017-02-07 MED ORDER — LACTATED RINGERS IV SOLN
INTRAVENOUS | Status: DC
  Administered 2017-02-07 (×3): via INTRAVENOUS

## 2017-02-07 MED ORDER — LIDOCAINE HCL 2 % IJ SOLN
INTRAMUSCULAR | Status: AC
  Filled 2017-02-07: qty 10

## 2017-02-07 MED ORDER — ONDANSETRON HCL 4 MG/2ML IJ SOLN
INTRAMUSCULAR | Status: AC
  Filled 2017-02-07: qty 2

## 2017-02-07 MED ORDER — PROPOFOL 10 MG/ML IV BOLUS
INTRAVENOUS | Status: AC
  Filled 2017-02-07: qty 20

## 2017-02-07 MED ORDER — ROCURONIUM BROMIDE 100 MG/10ML IV SOLN
INTRAVENOUS | Status: DC | PRN
  Administered 2017-02-07 (×2): 50 mg via INTRAVENOUS

## 2017-02-07 MED ORDER — PROPOFOL 10 MG/ML IV BOLUS
INTRAVENOUS | Status: DC | PRN
  Administered 2017-02-07: 160 mg via INTRAVENOUS

## 2017-02-07 MED ORDER — FENTANYL CITRATE (PF) 100 MCG/2ML IJ SOLN
25.0000 ug | INTRAMUSCULAR | Status: DC | PRN
  Administered 2017-02-07 (×4): 25 ug via INTRAVENOUS

## 2017-02-07 SURGICAL SUPPLY — 56 items
ADHESIVE MASTISOL STRL (MISCELLANEOUS) IMPLANT
CANISTER SUCT 1200ML W/VALVE (MISCELLANEOUS) ×3 IMPLANT
CATH FOL LEG HOLDER (MISCELLANEOUS) ×3 IMPLANT
CATH TRAY 16F METER LATEX (MISCELLANEOUS) ×3 IMPLANT
CHLORAPREP W/TINT 26ML (MISCELLANEOUS) IMPLANT
CLOSURE WOUND 1/2 X4 (GAUZE/BANDAGES/DRESSINGS)
COVER CLAMP SIL LG PBX B (MISCELLANEOUS) IMPLANT
DRAIN PENROSE 1/4X12 LTX (DRAIN) ×3 IMPLANT
DRAPE LAPAROTOMY 100X77 ABD (DRAPES) ×3 IMPLANT
DRAPE LEGGINS SURG 28X43 STRL (DRAPES) IMPLANT
DRSG OPSITE POSTOP 4X10 (GAUZE/BANDAGES/DRESSINGS) IMPLANT
DRSG OPSITE POSTOP 4X8 (GAUZE/BANDAGES/DRESSINGS) ×3 IMPLANT
DRSG TELFA 3X8 NADH (GAUZE/BANDAGES/DRESSINGS) IMPLANT
ELECT CAUTERY BLADE 6.4 (BLADE) ×3 IMPLANT
ELECT REM PT RETURN 9FT ADLT (ELECTROSURGICAL) ×3
ELECTRODE REM PT RTRN 9FT ADLT (ELECTROSURGICAL) ×1 IMPLANT
GAUZE SPONGE 4X4 12PLY STRL (GAUZE/BANDAGES/DRESSINGS) IMPLANT
GLOVE BIO SURGEON STRL SZ 6.5 (GLOVE) ×8 IMPLANT
GLOVE BIO SURGEON STRL SZ8 (GLOVE) ×12 IMPLANT
GLOVE BIO SURGEONS STRL SZ 6.5 (GLOVE) ×4
GLOVE INDICATOR 8.0 STRL GRN (GLOVE) ×12 IMPLANT
GOWN STRL REUS W/ TWL LRG LVL3 (GOWN DISPOSABLE) ×4 IMPLANT
GOWN STRL REUS W/ TWL XL LVL3 (GOWN DISPOSABLE) ×2 IMPLANT
GOWN STRL REUS W/TWL LRG LVL3 (GOWN DISPOSABLE) ×8
GOWN STRL REUS W/TWL XL LVL3 (GOWN DISPOSABLE) ×4
KIT RM TURNOVER STRD PROC AR (KITS) ×3 IMPLANT
LABEL OR SOLS (LABEL) IMPLANT
NDL SAFETY 22GX1.5 (NEEDLE) ×3 IMPLANT
NS IRRIG 1000ML POUR BTL (IV SOLUTION) ×3 IMPLANT
PACK BASIN MAJOR ARMC (MISCELLANEOUS) ×3 IMPLANT
PACK COLON CLEAN CLOSURE (MISCELLANEOUS) ×3 IMPLANT
RELOAD PROXIMATE 30MM BLUE (ENDOMECHANICALS) IMPLANT
RELOAD PROXIMATE 75MM BLUE (ENDOMECHANICALS) ×6 IMPLANT
RELOAD STAPLER LINEAR PROX 30 (STAPLE) IMPLANT
SEPRAFILM MEMBRANE 5X6 (MISCELLANEOUS) IMPLANT
SET YANKAUER POOLE SUCT (MISCELLANEOUS) IMPLANT
SOL PREP PVP 2OZ (MISCELLANEOUS) ×3
SOLUTION PREP PVP 2OZ (MISCELLANEOUS) ×1 IMPLANT
SPONGE LAP 18X18 5 PK (GAUZE/BANDAGES/DRESSINGS) IMPLANT
STAPLER GUN LINEAR PROX 60 (STAPLE) ×3 IMPLANT
STAPLER PROXIMATE 75MM BLUE (STAPLE) ×3 IMPLANT
STAPLER RELOAD LINEAR PROX 30 (STAPLE)
STAPLER SKIN PROX 35W (STAPLE) ×3 IMPLANT
STRIP CLOSURE SKIN 1/2X4 (GAUZE/BANDAGES/DRESSINGS) IMPLANT
SUT MNCRL 3-0 UNDYED SH (SUTURE) ×1 IMPLANT
SUT MONOCRYL 3-0 UNDYED (SUTURE) ×2
SUT PDS AB 1 CT1 27 (SUTURE) IMPLANT
SUT PDS AB 1 TP1 54 (SUTURE) ×6 IMPLANT
SUT SILK 0 (SUTURE) ×2
SUT SILK 0 30XBRD TIE 6 (SUTURE) ×1 IMPLANT
SUT SILK 3-0 (SUTURE) ×9 IMPLANT
SUT VIC AB 1 CTX 27 (SUTURE) IMPLANT
SUT VIC AB 2-0 CT1 (SUTURE) ×3 IMPLANT
SUT VICRYL 0 TIES 12 18 (SUTURE) IMPLANT
SYR 10ML LL (SYRINGE) ×3 IMPLANT
SYRINGE 10CC LL (SYRINGE) ×3 IMPLANT

## 2017-02-07 NOTE — Anesthesia Preprocedure Evaluation (Signed)
Anesthesia Evaluation  Patient identified by MRN, date of birth, ID band Patient awake    Reviewed: Allergy & Precautions, H&P , NPO status , Patient's Chart, lab work & pertinent test results, reviewed documented beta blocker date and time   Airway Mallampati: II  TM Distance: >3 FB Neck ROM: full    Dental  (+) Teeth Intact   Pulmonary neg pulmonary ROS, Current Smoker,    Pulmonary exam normal        Cardiovascular negative cardio ROS Normal cardiovascular exam Rhythm:regular Rate:Normal     Neuro/Psych negative neurological ROS  negative psych ROS   GI/Hepatic negative GI ROS, Neg liver ROS, GERD  Medicated,  Endo/Other  negative endocrine ROS  Renal/GU negative Renal ROS  negative genitourinary   Musculoskeletal   Abdominal   Peds  Hematology negative hematology ROS (+)   Anesthesia Other Findings Past Medical History: No date: Anxiety No date: Diverticulitis No date: GERD (gastroesophageal reflux disease) Past Surgical History: 08/15/2016: COLON RESECTION SIGMOID N/A     Comment: Procedure: COLON RESECTION SIGMOID;  Surgeon:               [name] E Cooper, MD;  Location: [facility] OR[facility];                Service: General;  Laterality: N/A; 07/13/2016: COLONOSCOPY WITH PROPOFOL N/A     Comment: Procedure: COLONOSCOPY WITH PROPOFOL;                Surgeon: Midge Miniumarren Wohl, MD;  [facility];  Service: Endoscopy;  Laterality:              N/A; 09/06/2016: LAPAROTOMY N/A     Comment: Procedure: DRAINAGE OF PELVIC ABSCESS,               ILEOSTOMY;  Surgeon: [name] E Cooper, MD;                Location: [facility];  Service: General;                Laterality: N/A; 2010: NOSE SURGERY   Reproductive/Obstetrics negative OB ROS                             Anesthesia Physical Anesthesia Plan  ASA: II  Anesthesia Plan: General ETT   Post-op Pain Management:     Induction:   Airway Management Planned:   Additional Equipment:   Intra-op Plan:   Post-operative Plan:   Informed Consent: I have reviewed the patients History and Physical, chart, labs and discussed the procedure including the risks, benefits and alternatives for the proposed anesthesia with the patient or authorized representative who has indicated his/her understanding and acceptance.   Dental Advisory Given  Plan Discussed with: CRNA  Anesthesia Plan Comments:         Anesthesia Quick Evaluation

## 2017-02-07 NOTE — Anesthesia Post-op Follow-up Note (Cosign Needed)
Anesthesia QCDR form completed.        

## 2017-02-07 NOTE — Anesthesia Postprocedure Evaluation (Signed)
Anesthesia Post Note  Patient: Matthew Chaney  Procedure(s) Performed: Procedure(s) (LRB): ILEOSTOMY TAKEDOWN (N/A)  Patient location during evaluation: PACU Anesthesia Type: General Level of consciousness: awake and alert and oriented Pain management: pain level controlled Vital Signs Assessment: post-procedure vital signs reviewed and stable Respiratory status: spontaneous breathing, nonlabored ventilation and respiratory function stable Cardiovascular status: blood pressure returned to baseline and stable Postop Assessment: no signs of nausea or vomiting Anesthetic complications: no     Last Vitals:  Vitals:   02/07/17 1444 02/07/17 1451  BP:  (!) 147/108  Pulse: 98 94  Resp: 17 16  Temp:  36.7 C    Last Pain:  Vitals:   02/07/17 1451  TempSrc:   PainSc: 6                  Kenn Rekowski

## 2017-02-07 NOTE — Transfer of Care (Signed)
Immediate Anesthesia Transfer of Care Note  Patient: Matthew Chaney  Procedure(s) Performed: Procedure(s): ILEOSTOMY TAKEDOWN (N/A)  Patient Location: PACU  Anesthesia Type:General  Level of Consciousness: awake, alert , oriented and patient cooperative  Airway & Oxygen Therapy: Patient Spontanous Breathing and Patient connected to face mask oxygen  Post-op Assessment: Report given to RN, Post -op Vital signs reviewed and stable and Patient moving all extremities X 4  Post vital signs: Reviewed and stable  Last Vitals:  Vitals:   02/07/17 1106 02/07/17 1339  BP: (!) 119/100 (!) 156/116  Pulse: (!) 109 95  Resp: 18 16  Temp: 37.1 C 36.8 C    Last Pain:  Vitals:   02/07/17 1106  TempSrc: Tympanic  PainSc: 4          Complications: No apparent anesthesia complications

## 2017-02-07 NOTE — Op Note (Signed)
02/07/2017  1:39 PM  PATIENT:  Matthew Chaney  36 y.o. male  PRE-OPERATIVE DIAGNOSIS:  Ileostomy, history of diverticulitis  POST-OPERATIVE DIAGNOSIS:  Same  PROCEDURE: Closure of ileostomy  SURGEON:  [name] E Shanyla Marconi MD, [facility] Assistant surgeon Dr. Ricarda Frameharles Woodham  ANESTHESIA:   Gen. with endotracheal tube   Details of Procedure: This patient with a history of a anastomotic leak following colon resection for acute diverticulitis. He had a protecting ileostomy placed and has recovered. His workup and shown no sign of stricture or additional leakage therefore closure is anticipated today. Preoperatively we discussed rationale for closure of the ileostomy the potential for leaving it open and the risk bleeding infection recurrence anastomotic leak and the need for further surgery or further ostomies this is all reviewed for him in the preop holding area.  Findings: Ileostomy Specimen: Ileostomy  Description of procedure patient was discharged general anesthesia Foley catheter was placed he was then prepped and draped sterile fashion he was in the supine position.  A time out was held he had been given IV antibiotics as well and VT E prophylaxis was in place.  A lenticular shaped incision was drawn out around the existing ileostomy and sharp dissection with electrocautery was utilized to dissect down through the subcutaneous tissues to the fascia. The fascial edges were identified and opened allowing for dissection circumferentially around the existing ileostomy. The ileostomy was elevated out of the abdomen and it was inspected.  Continuity was then obtained by utilizing a GIA stapler. In anticipation of utilizing a TA stapler to close this enterostomy, the redundant portion of the ileostomy was excised with GI staplers and the mesentery was divided between clamps and ligated with 0 silk ligatures. There was no sign of bleeding at this point.  Closure of the enterostomy was performed  with a TA stapler and Allis clamps. When the TA stapler was released there was a defect in the staple line therefore this was closed with 3-0 Monocryl and inverted with Lembert sutures of 3-0 silk. There was good patency palpable with the enteroenterostomy.  The rent in the mesentery was closed with figure-of-eight 3-0 silk's and the area was inspected. There is no sign of bleeding and the tissue was viable. It was reduced back into the abdominal cavity. Inspection of the anastomosis in situ was performed and there was no sign of bleeding and no sign of ischemia.  With the sponge lap needle count being correct the wound was closed with #1 PDS figure-of-eight sutures. Marcaine was infiltrated into the skin and subcutaneous tissues tissues for a total of 30 cc. Then deep sutures of 2-0 Vicryl was placed in a running fashion over a Penrose drain which was held in place with staples in the skin edges were approximated loosely with staples as well.  Sterile dressing was placed and sponge lap needle count was correct the estimated blood loss was 25 cc. Patient hour to this procedure well the workup occasions he was taken the recovery room in stable condition to be admitted for continued care   [name] E Tasmine Hipwell, MD FACS

## 2017-02-07 NOTE — Anesthesia Procedure Notes (Signed)
Procedure Name: Intubation Date/Time: 02/07/2017 12:18 PM Performed by: Silvana Newness Pre-anesthesia Checklist: Patient identified, Emergency Drugs available, Suction available, Patient being monitored and Timeout performed Patient Re-evaluated:Patient Re-evaluated prior to inductionOxygen Delivery Method: Circle system utilized Preoxygenation: Pre-oxygenation with 100% oxygen Intubation Type: IV induction Ventilation: Mask ventilation without difficulty Laryngoscope Size: Mac and 4 Grade View: Grade I Tube type: Oral Tube size: 7.5 mm Number of attempts: 1 Airway Equipment and Method: Stylet Placement Confirmation: ETT inserted through vocal cords under direct vision,  positive ETCO2 and breath sounds checked- equal and bilateral Secured at: 22 cm Tube secured with: Tape Dental Injury: Teeth and Oropharynx as per pre-operative assessment

## 2017-02-07 NOTE — Progress Notes (Signed)
Preoperative Review   Patient is met in the preoperative holding area. The history is reviewed in the chart and with the patient. I personally reviewed the options and rationale as well as the risks of this procedure that have been previously discussed with the patient. All questions asked by the patient and/or family were answered to their satisfaction.  Patient agrees to proceed with this procedure at this time.  Taneisha Fuson E Maureen Duesing M.D. FACS  

## 2017-02-08 ENCOUNTER — Encounter: Payer: Self-pay | Admitting: Surgery

## 2017-02-08 LAB — BASIC METABOLIC PANEL
Anion gap: 7 (ref 5–15)
BUN: 12 mg/dL (ref 6–20)
CHLORIDE: 108 mmol/L (ref 101–111)
CO2: 23 mmol/L (ref 22–32)
Calcium: 8.1 mg/dL — ABNORMAL LOW (ref 8.9–10.3)
Creatinine, Ser: 1 mg/dL (ref 0.61–1.24)
GFR calc non Af Amer: >60 mL/min (ref >60)
Glucose, Bld: 105 mg/dL — ABNORMAL HIGH (ref 65–99)
POTASSIUM: 3.3 mmol/L — AB (ref 3.5–5.1)
SODIUM: 138 mmol/L (ref 135–145)

## 2017-02-08 LAB — CBC
HEMATOCRIT: 38.6 % — AB (ref 40.0–52.0)
Hemoglobin: 13.3 g/dL (ref 13.0–18.0)
MCH: 28.7 pg (ref 26.0–34.0)
MCHC: 34.3 g/dL (ref 32.0–36.0)
MCV: 83.5 fL (ref 80.0–100.0)
Platelets: 246 10*3/uL (ref 150–440)
RBC: 4.63 MIL/uL (ref 4.40–5.90)
RDW: 14.5 % (ref 11.5–14.5)
WBC: 10.2 10*3/uL (ref 3.8–10.6)

## 2017-02-08 LAB — SURGICAL PATHOLOGY

## 2017-02-08 NOTE — Progress Notes (Signed)
1 Day Post-Op  Subjective: Patient status post ileostomy closure. He states he feels well having minimal pain no nausea vomiting but no passage of flatus or gas or stool yet.  Objective: Vital signs in last 24 hours: Temp:  [98.1 F (36.7 C)-98.7 F (37.1 C)] 98.2 F (36.8 C) (05/17 0449) Pulse Rate:  [92-114] 94 (05/17 0449) Resp:  [10-18] 16 (05/17 0449) BP: (119-156)/(85-116) 129/88 (05/17 0449) SpO2:  [95 %-100 %] 100 % (05/17 0449)    Intake/Output from previous day: 05/16 0701 - 05/17 0700 In: 2490 [P.O.:740; I.V.:1750] Out: 1040 [Urine:340; Drains:650; Blood:50] Intake/Output this shift: No intake/output data recorded.  Physical exam:  Abdomen is soft nontender dressing is in place with Penrose. Calves are nontender  Lab Results: CBC   Recent Labs  02/07/17 1528 02/08/17 0542  WBC 8.2 10.2  HGB 14.8 13.3  HCT 43.5 38.6*  PLT 234 246   BMET  Recent Labs  02/06/17 0856 02/07/17 1528 02/08/17 0542  NA 138  --  138  K 3.4*  --  3.3*  CL 110  --  108  CO2 21*  --  23  GLUCOSE 94  --  105*  BUN 16  --  12  CREATININE 1.16 1.07 1.00  CALCIUM 9.2  --  8.1*   PT/INR No results for input(s): LABPROT, INR in the last 72 hours. ABG No results for input(s): PHART, HCO3 in the last 72 hours.  Invalid input(s): PCO2, PO2  Studies/Results: No results found.  Anti-infectives: Anti-infectives    Start     Dose/Rate Route Frequency Ordered Stop   02/07/17 0600  ertapenem (INVANZ) 1 g in sodium chloride 0.9 % 50 mL IVPB     1 g 100 mL/hr over 30 Minutes Intravenous On call to O.R. 02/06/17 2238 02/07/17 1240      Assessment/Plan: s/p Procedure(s): ILEOSTOMY TAKEDOWN   Labs are reviewed. Patient doing very well recommend continue clear liquids only for this point we will decrease IV fluids and advance diet once bowel function returns.  [name] E Anastashia Westerfeld, MD, FACS  02/08/2017

## 2017-02-09 ENCOUNTER — Encounter: Payer: Self-pay | Admitting: General Surgery

## 2017-02-09 LAB — CBC WITH DIFFERENTIAL/PLATELET
Basophils Absolute: 0 10*3/uL (ref 0–0.1)
Basophils Relative: 0 %
EOS PCT: 0 %
Eosinophils Absolute: 0 10*3/uL (ref 0–0.7)
HEMATOCRIT: 35 % — AB (ref 40.0–52.0)
Hemoglobin: 12.1 g/dL — ABNORMAL LOW (ref 13.0–18.0)
LYMPHS ABS: 1.2 10*3/uL (ref 1.0–3.6)
LYMPHS PCT: 20 %
MCH: 28.7 pg (ref 26.0–34.0)
MCHC: 34.5 g/dL (ref 32.0–36.0)
MCV: 83.3 fL (ref 80.0–100.0)
MONO ABS: 0.7 10*3/uL (ref 0.2–1.0)
Monocytes Relative: 10 %
NEUTROS ABS: 4.4 10*3/uL (ref 1.4–6.5)
Neutrophils Relative %: 70 %
Platelets: 193 10*3/uL (ref 150–440)
RBC: 4.2 MIL/uL — AB (ref 4.40–5.90)
RDW: 14.6 % — AB (ref 11.5–14.5)
WBC: 6.4 10*3/uL (ref 3.8–10.6)

## 2017-02-09 MED ORDER — BISACODYL 10 MG RE SUPP
10.0000 mg | Freq: Once | RECTAL | Status: AC
  Administered 2017-02-09: 10 mg via RECTAL
  Filled 2017-02-09: qty 1

## 2017-02-09 NOTE — Care Management (Signed)
Patient admitted for Closure of ileostomy.  [facility]  [facility].  RNCM following for any discharge medication needs.

## 2017-02-09 NOTE — Progress Notes (Signed)
2 Days Post-Op  Subjective: Patient feels well today minimal pain would like to advance his diet. He has not passed any gas or had a stool yet  Objective: Vital signs in last 24 hours: Temp:  [97.6 F (36.4 C)-98.8 F (37.1 C)] 98.8 F (37.1 C) (05/18 0428) Pulse Rate:  [85-106] 106 (05/18 0428) Resp:  [18-20] 20 (05/18 0428) BP: (128-141)/(80-90) 128/80 (05/18 0428) SpO2:  [100 %] 100 % (05/18 0428) Last BM Date:  (prior to admission)  Intake/Output from previous day: 05/17 0701 - 05/18 0700 In: 3520 [P.O.:520; I.V.:3000] Out: 1610 [Urine:1610] Intake/Output this shift: No intake/output data recorded.  Physical exam:  Abdomen is soft and minimally distended incision is clean without purulence Penrose in place dressing is changed calves are nontender  Lab Results: CBC   Recent Labs  02/08/17 0542 02/09/17 0527  WBC 10.2 6.4  HGB 13.3 12.1*  HCT 38.6* 35.0*  PLT 246 193   BMET  Recent Labs  02/06/17 0856 02/07/17 1528 02/08/17 0542  NA 138  --  138  K 3.4*  --  3.3*  CL 110  --  108  CO2 21*  --  23  GLUCOSE 94  --  105*  BUN 16  --  12  CREATININE 1.16 1.07 1.00  CALCIUM 9.2  --  8.1*   PT/INR No results for input(s): LABPROT, INR in the last 72 hours. ABG No results for input(s): PHART, HCO3 in the last 72 hours.  Invalid input(s): PCO2, PO2  Studies/Results: No results found.  Anti-infectives: Anti-infectives    Start     Dose/Rate Route Frequency Ordered Stop   02/07/17 0600  ertapenem (INVANZ) 1 g in sodium chloride 0.9 % 50 mL IVPB     1 g 100 mL/hr over 30 Minutes Intravenous On call to O.R. 02/06/17 2238 02/07/17 1240      Assessment/Plan: s/p Procedure(s): ILEOSTOMY TAKEDOWN   Patient doing quite well will advance to a full liquid diet and give a suppository to stimulate activity.  [name] E Delesha Pohlman, MD, FACS  02/09/2017

## 2017-02-09 NOTE — Consult Note (Signed)
WOC Nurse ostomy follow up Patient with takedown of ileostomy.  Has been seen extensively outpatient for pouching challenges.  Patient is tearful and happy with surgical outcome. RLQ surgical dressing in place.  Complaining of tenderness to abdomen.  Encouraged to ambulate to get gas moving.  WOC team will not follow but will remain available to patient, medical and nursing teams.   Maple HudsonKaren Arling Cerone RN BSN CWON Pager [phone]

## 2017-02-10 LAB — CBC WITH DIFFERENTIAL/PLATELET
BASOS PCT: 0 %
Basophils Absolute: 0 10*3/uL (ref 0–0.1)
Eosinophils Absolute: 0 10*3/uL (ref 0–0.7)
Eosinophils Relative: 0 %
HCT: 35.5 % — ABNORMAL LOW (ref 40.0–52.0)
Hemoglobin: 12.4 g/dL — ABNORMAL LOW (ref 13.0–18.0)
LYMPHS PCT: 15 %
Lymphs Abs: 1.1 10*3/uL (ref 1.0–3.6)
MCH: 29.1 pg (ref 26.0–34.0)
MCHC: 35 g/dL (ref 32.0–36.0)
MCV: 83.1 fL (ref 80.0–100.0)
MONO ABS: 0.8 10*3/uL (ref 0.2–1.0)
MONOS PCT: 12 %
NEUTROS ABS: 5.4 10*3/uL (ref 1.4–6.5)
NEUTROS PCT: 73 %
Platelets: 205 10*3/uL (ref 150–440)
RBC: 4.28 MIL/uL — ABNORMAL LOW (ref 4.40–5.90)
RDW: 14.2 % (ref 11.5–14.5)
WBC: 7.3 10*3/uL (ref 3.8–10.6)

## 2017-02-10 NOTE — Progress Notes (Signed)
3 Days Post-Op  Subjective: Patient feels better today less gas pains he had a loose bowel movement today. He is tolerating a liquid diet  Objective: Vital signs in last 24 hours: Temp:  [97.7 F (36.5 C)-99.8 F (37.7 C)] 99.1 F (37.3 C) (05/19 0429) Pulse Rate:  [88-125] 109 (05/19 0813) Resp:  [18-19] 18 (05/19 0429) BP: (121-133)/(79-86) 123/86 (05/19 0429) SpO2:  [96 %-100 %] 97 % (05/19 0429) Last BM Date: 02/10/17  Intake/Output from previous day: No intake/output data recorded. Intake/Output this shift: No intake/output data recorded.  Physical exam:  Wound is clean minimal erythema minimal drainage around Penrose drain. Otherwise soft and nontender  Lab Results: CBC   Recent Labs  02/09/17 0527 02/10/17 0347  WBC 6.4 7.3  HGB 12.1* 12.4*  HCT 35.0* 35.5*  PLT 193 205   BMET  Recent Labs  02/07/17 1528 02/08/17 0542  NA  --  138  K  --  3.3*  CL  --  108  CO2  --  23  GLUCOSE  --  105*  BUN  --  12  CREATININE 1.07 1.00  CALCIUM  --  8.1*   PT/INR No results for input(s): LABPROT, INR in the last 72 hours. ABG No results for input(s): PHART, HCO3 in the last 72 hours.  Invalid input(s): PCO2, PO2  Studies/Results: No results found.  Anti-infectives: Anti-infectives    Start     Dose/Rate Route Frequency Ordered Stop   02/07/17 0600  ertapenem (INVANZ) 1 g in sodium chloride 0.9 % 50 mL IVPB     1 g 100 mL/hr over 30 Minutes Intravenous On call to O.R. 02/06/17 2238 02/07/17 1240      Assessment/Plan: s/p Procedure(s): ILEOSTOMY TAKEDOWN   Patient doing very well will advance diet and probably discharge tomorrow.  [name] E Cooper, MD, FACS  02/10/2017

## 2017-02-11 MED ORDER — OXYCODONE-ACETAMINOPHEN 5-325 MG PO TABS: 1.0000 | ORAL_TABLET | ORAL | 0 refills | Status: DC | PRN

## 2017-02-11 NOTE — Discharge Summary (Signed)
Physician Discharge Summary  Patient ID: Matthew AbleJoshua A Chaney MRN: [id number] DOB/AGE: [birth year] 36 y.o.  Admit date: 02/07/2017 Discharge date: 02/11/2017   Discharge Diagnoses:  Active Problems:   Diverticula of colon   Procedures:Ileostomy closure  Hospital Course: This a patient who underwent an elective colectomy for diverticulitis and then experienced a anastomotic leak. He was treated with diversion via a loop ileostomy. He was admitted the hospital for elective ileostomy closure after workup demonstrated patent non-strictured and non-leaking anastomosis of the sigmoid colon. Postoperatively he did well as having bowel movements tolerating a regular diet and is discharged stable condition on oral analgesics to follow up in our office on Wednesday for Penrose drain removal. Structures to to shower and replace with a dry dressing until seen in the office on Wednesday  Consults: None  Disposition: 01-Home or Self Care   Allergies as of 02/11/2017      Reactions   Other Other (See Comments)   Seasonal allergies:  Sneezing, watery eyes, itchy throat. Seasonal allergies:  Sneezing, watery eyes, itchy throat.   Vicodin [hydrocodone-acetaminophen] Hives, Rash      Medication List    TAKE these medications   acetaminophen 500 MG tablet Commonly known as:  TYLENOL Take 1,000 mg by mouth every 6 (six) hours as needed (for pain.).   ibuprofen 200 MG tablet Commonly known as:  ADVIL,MOTRIN Take 800 mg by mouth every 8 (eight) hours as needed (for pain.).   magnesium oxide 400 MG tablet Commonly known as:  MAG-OX Take 1 tablet (400 mg total) by mouth daily.   NEXIUM 24HR 20 MG capsule Generic drug:  esomeprazole Take 20 mg by mouth daily.   oxyCODONE-acetaminophen 5-325 MG tablet Commonly known as:  PERCOCET/ROXICET Take 1-2 tablets by mouth every 4 (four) hours as needed for moderate pain or severe pain.      Follow-up Information    Matthew Chaney, Matthew Kilgore Chaney, Matthew Chaney Follow up in 3  day(s).   Specialty:  Surgery Why:  See Dr. Excell Seltzerooper on Wednesday for Penrose drain removal Contact information: [address] [place] [id number] [phone]           Matthew Hawichard Chaney Megan Hayduk, Matthew Chaney, FACS

## 2017-02-11 NOTE — Discharge Instructions (Signed)
May shower Dry dressing over wound and Penrose drain daily Resume home medications Utilize oral analgesics for pain Regular diet Follow-up with Dr. Excell Seltzerooper on Wednesday for Penrose drain removal  Follow all MD discharge instructions. Take all medications as prescribed. Keep all follow up appointments. If your symptoms return, call your doctor. If you experience any new symptoms that are of concern to you or that are bothersome to you, call your doctor. For all questions and/or concerns, call your doctor.  If you experience any bleeding or discharge from your incision sites, redness or swelling at your incision sites, fever, pain uncontrolled by medication, increase in pain, or any nausea, call your doctor.   If you have a medical emergency, call 911

## 2017-02-11 NOTE — Progress Notes (Addendum)
Pt d/c home; d/c instructions reviewed w/ pt; pt understanding was verbalized; IV removed, catheter in tact, gauze dressing applied; incision dressing CDI at d/c; pt teaching re home dressing changes done, pt verbalized understanding; all pt questions answered; pt verbalized that all pt belongings were accounted for; pt left unit via wheelchair accompanied by staff

## 2017-02-11 NOTE — Progress Notes (Signed)
4 Days Post-Op  Subjective: Patient feels quite well today status post ileostomy closure. He is passing a lot of gas and having bowel movements he did have some nausea with eating and is only eating a small amount.  Objective: Vital signs in last 24 hours: Temp:  [98.4 F (36.9 C)-99 F (37.2 C)] 98.4 F (36.9 C) (05/20 0535) Pulse Rate:  [103-121] 121 (05/20 0535) Resp:  [16-17] 16 (05/20 0535) BP: (99-121)/(77-87) 121/87 (05/20 0535) SpO2:  [96 %-99 %] 96 % (05/20 0535) Last BM Date: 02/10/17  Intake/Output from previous day: 05/19 0701 - 05/20 0700 In: 240 [P.O.:240] Out: 0  Intake/Output this shift: No intake/output data recorded.  Physical exam:  Abdomen is soft and nontender wound is clean with minimal serous drainage around Penrose drain. Minimal erythema.  Lab Results: CBC   Recent Labs  02/09/17 0527 02/10/17 0347  WBC 6.4 7.3  HGB 12.1* 12.4*  HCT 35.0* 35.5*  PLT 193 205   BMET No results for input(s): NA, K, CL, CO2, GLUCOSE, BUN, CREATININE, CALCIUM in the last 72 hours. PT/INR No results for input(s): LABPROT, INR in the last 72 hours. ABG No results for input(s): PHART, HCO3 in the last 72 hours.  Invalid input(s): PCO2, PO2  Studies/Results: No results found.  Anti-infectives: Anti-infectives    Start     Dose/Rate Route Frequency Ordered Stop   02/07/17 0600  ertapenem (INVANZ) 1 g in sodium chloride 0.9 % 50 mL IVPB     1 g 100 mL/hr over 30 Minutes Intravenous On call to O.R. 02/06/17 2238 02/07/17 1240      Assessment/Plan: s/p Procedure(s): ILEOSTOMY TAKEDOWN   Patient is doing very well will observe today and possibly discharge this afternoon if he is feeling well enough. Penrose drain will be removed in the office this week  [name] E Shireen Rayburn, MD, Carilyn GoodpastureFACS  02/11/2017

## 2017-02-12 ENCOUNTER — Telehealth: Payer: Self-pay | Admitting: General Practice

## 2017-02-12 NOTE — Telephone Encounter (Signed)
error 

## 2017-02-13 ENCOUNTER — Telehealth: Payer: Self-pay

## 2017-02-13 NOTE — Telephone Encounter (Signed)
Patient called in and requested to speak with me. I returned phone call to patient. No answer. Voicemail was left for return phone call.

## 2017-02-13 NOTE — Telephone Encounter (Signed)
Patient returned phone call at this time. He states that he is feeling great this far into his recovery. He is passing a significant amount of flatus. I encouraged him to keep moving around and drinking water. He denies nausea, vomiting, constipation, diarrhea, fever, and chills. He states bowels are moving well without difficulty or straining. He is questioning how to take pain medication. I explained to patient that he is to take 1 percocet if he begins to have moderate pain, if this is not better within 30-45 minutes he is to take another tablet. After this time, he needs to wait 4 hours between the next dose. But he is encouraged to only use what is necessary to take care of the pain. We had a discussion about not needing to take the pain medication around the clock just because of the fear that he may have been. The pain medication should only be used if moderate to severe pain is experienced. Patient verbalizes understanding and will be at scheduled appointment tomorrow with Dr. Excell Seltzerooper.

## 2017-02-14 ENCOUNTER — Encounter: Payer: Self-pay | Admitting: Surgery

## 2017-02-14 ENCOUNTER — Ambulatory Visit (INDEPENDENT_AMBULATORY_CARE_PROVIDER_SITE_OTHER): Payer: Self-pay | Admitting: Surgery

## 2017-02-14 ENCOUNTER — Telehealth: Payer: Self-pay | Admitting: General Practice

## 2017-02-14 VITALS — BP 117/83 | HR 135 | Temp 98.9°F | Resp 16 | Ht 69.0 in | Wt 178.4 lb

## 2017-02-14 DIAGNOSIS — Z4889 Encounter for other specified surgical aftercare: Secondary | ICD-10-CM

## 2017-02-14 NOTE — Telephone Encounter (Signed)
Patient called asking if you would give him a call back when you get a chance. Please call patient and advice.

## 2017-02-14 NOTE — Patient Instructions (Signed)
We will se you back in 2 weeks. Keep a dressing over this area until it completely stops draining. This may take 3-4 days.  If you have any questions or concerns prior to your appointment, please call our office and speak with a nurse.

## 2017-02-14 NOTE — Progress Notes (Signed)
Outpatient postop visit  02/14/2017  Matthew Chaney is an 36 y.o. male.    Procedure: Ileostomy closure  CC: No complaints  HPI: Patient status post ileostomy closure. He has a Penrose drain in place.  Medications reviewed.    Physical Exam:  Ht 5\' 9"  (1.753 m)     PE: No erythema minimal serous drainage around the Penrose drains no purulence    Assessment/Plan:  Penrose drain is removed dressing is placed. Patient doing very well recommend follow-up in 10 days for remaining staple removal  [name] E Kianna Billet, MD, FACS

## 2017-02-15 NOTE — Telephone Encounter (Signed)
Spoke with patient at this time. He had questions in regards to taking Gas-X and bowel movements not being completed solid. All questions were answered. Patient encouraged to call back with any questions or concerns.

## 2017-02-27 ENCOUNTER — Ambulatory Visit (INDEPENDENT_AMBULATORY_CARE_PROVIDER_SITE_OTHER): Payer: Self-pay | Admitting: Surgery

## 2017-02-27 ENCOUNTER — Encounter: Payer: Self-pay | Admitting: Surgery

## 2017-02-27 VITALS — BP 141/103 | HR 100 | Temp 95.5°F | Ht 69.0 in | Wt 181.0 lb

## 2017-02-27 DIAGNOSIS — Z4889 Encounter for other specified surgical aftercare: Secondary | ICD-10-CM

## 2017-02-27 NOTE — Progress Notes (Signed)
Outpatient postop visit  02/27/2017  Matthew Chaney is an 36 y.o. male.    Procedure: Ileostomy closure  CC: No new problems  HPI: Patient here for exam following ileostomy closure. Penrose drain was removed from the incision last week.  Medications reviewed.    Physical Exam:  There were no vitals taken for this visit.    PE: No erythema no drainage soft nontender abdomen staples are removed    Assessment/Plan:  Patient doing very well staples are removed today. Patient will follow up with me on an as needed basis he is doing very well at this point.  [name] E Alva Kuenzel, MD, FACS

## 2017-02-27 NOTE — Patient Instructions (Signed)
Please call our office if you have questions or concerns.   

## 2017-05-09 ENCOUNTER — Telehealth: Payer: Self-pay

## 2017-05-09 NOTE — Telephone Encounter (Signed)
Patient stated he has a red painful bump on his incision. No drainage, denies fever.  Patient added to Dr.Coopers schedule 05/09/17 @ 11:45.

## 2017-05-10 ENCOUNTER — Ambulatory Visit: Payer: Self-pay | Admitting: Surgery

## 2017-05-10 ENCOUNTER — Telehealth: Payer: Self-pay

## 2017-05-10 NOTE — Telephone Encounter (Signed)
Patient called in requesting an appointment sooner to look at area on old colostomy site which he states is red and oozing drainage. Patient was not clear on exactly where drainage is coming from or any other details. I offered patient multiple appointments but explained that he would ultimately need to come back to see his operating surgeon. He was given an [facility] for assessment purposes [facility] Aleen CampiPiscoya.

## 2017-05-15 ENCOUNTER — Ambulatory Visit: Payer: Self-pay | Admitting: Surgery

## 2017-05-30 ENCOUNTER — Ambulatory Visit: Payer: Self-pay | Admitting: Surgery

## 2017-05-30 ENCOUNTER — Telehealth: Payer: Self-pay | Admitting: General Practice

## 2017-05-30 NOTE — Telephone Encounter (Signed)
Unable to leave a message on patients voicemail was calling to reschedule patients no showed visit on 05/30/17 with Dr. Excell Seltzerooper, if the patient calls back please r/s his no showed appointment.

## 2017-08-03 IMAGING — CT CT ABD-PELV W/ CM
1 of 3 series · 12 of 32 positions shown, 18 images · IV contrast (APPLIED)
Comparison: None.

CLINICAL DATA: Partial colon resection dated 08/12/2016 for
diverticulitis, left mid abdominal pain, diarrhea, leukocytosis x1
week.

EXAM:
CT ABDOMEN AND PELVIS WITH CONTRAST
TECHNIQUE: Multidetector CT imaging of the abdomen and pelvis was performed
using the standard protocol following bolus administration of
intravenous contrast.
CONTRAST:  100mL [id number] IOPAMIDOL ([id number]-UXX) INJECTION 61%

[Series 2: axial st · axial · 0.72mm/px · z∈[-940,-520]mm · 12 of 100 slices shown, 18 images]
[im 8/100  soft-tissue]
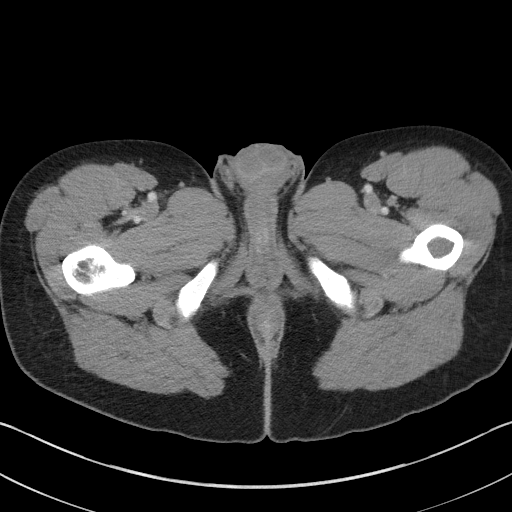
[im 8/100  bone]
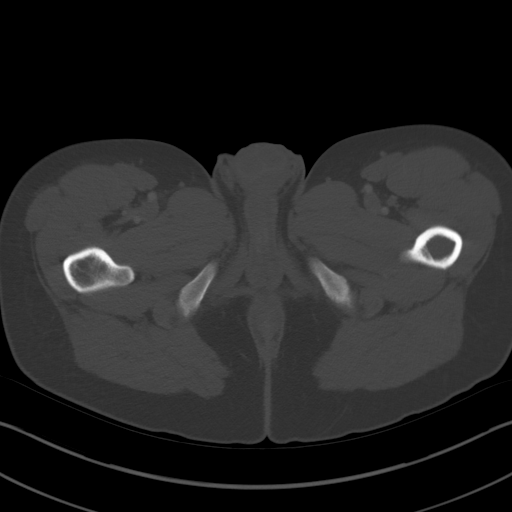
[im 15/100  soft-tissue]
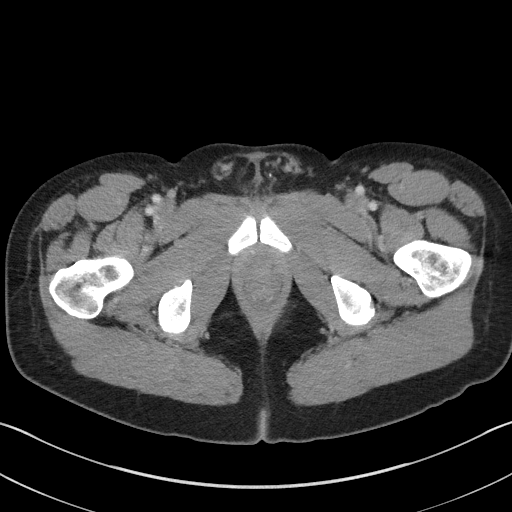
[im 22/100  soft-tissue]
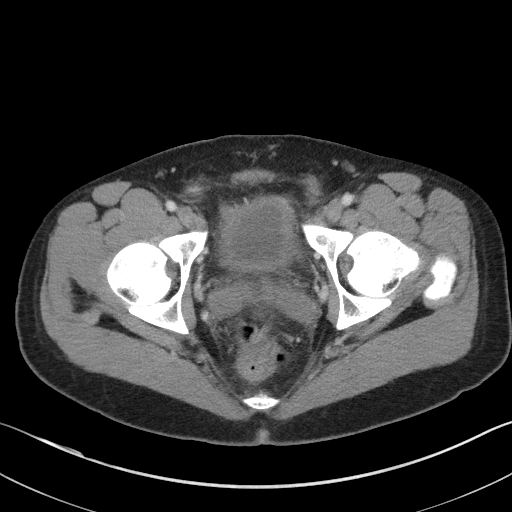
[im 29/100  soft-tissue]
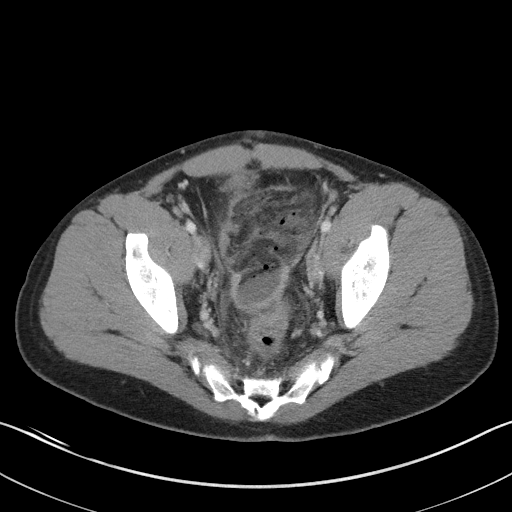
[im 36/100  soft-tissue]
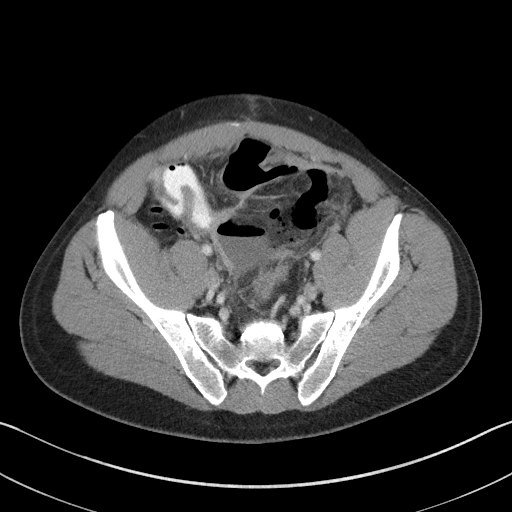
[im 43/100  soft-tissue]
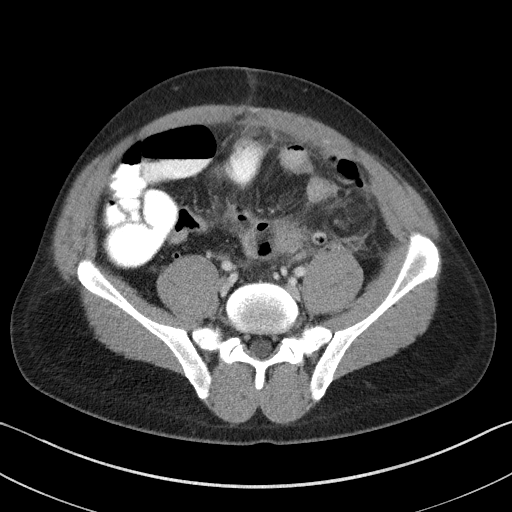
[im 57/100  soft-tissue]
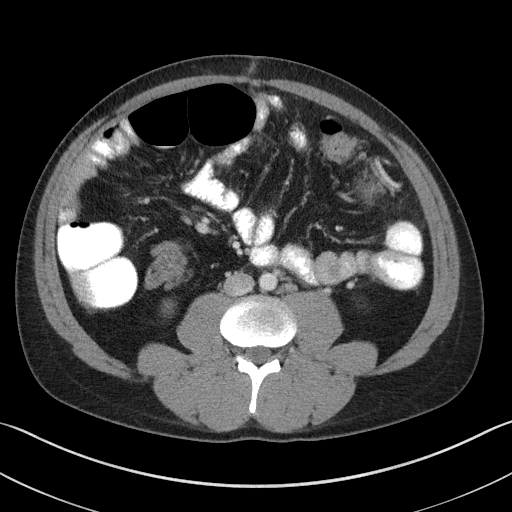
[im 64/100  soft-tissue]
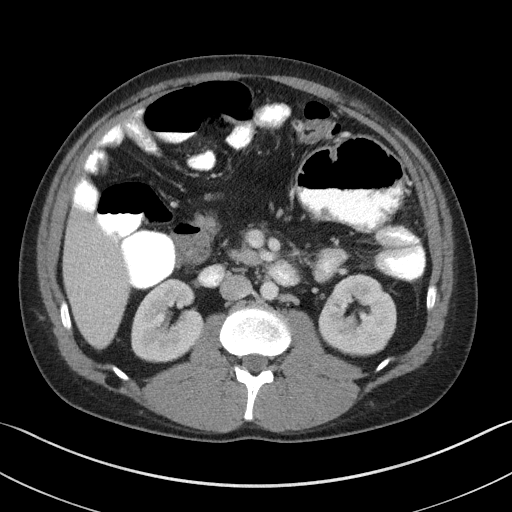
[im 71/100  soft-tissue]
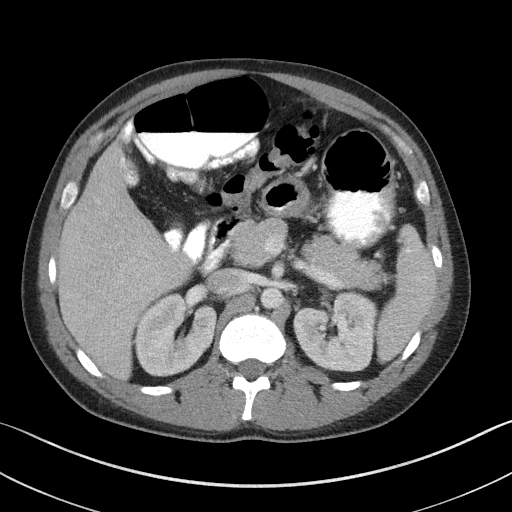
[im 71/100  lung]
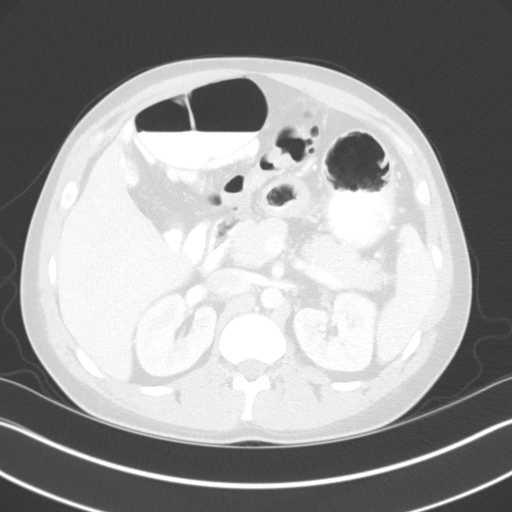
[im 71/100  bone]
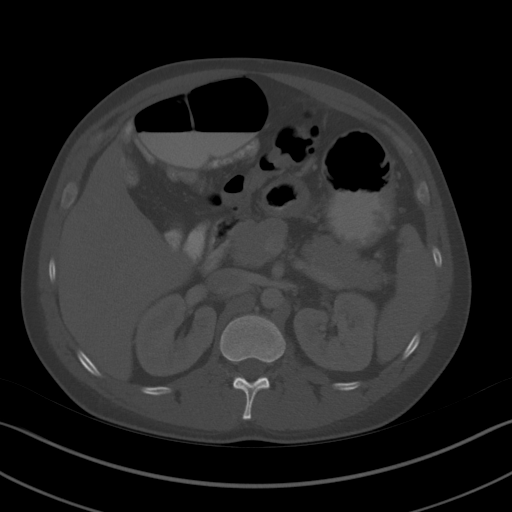
[im 78/100  soft-tissue]
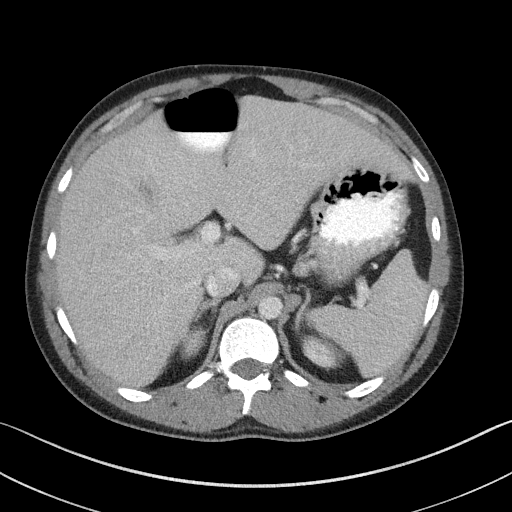
[im 78/100  lung]
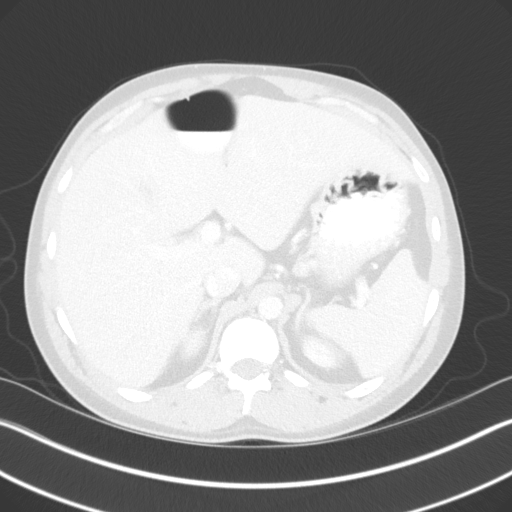
[im 85/100  soft-tissue]
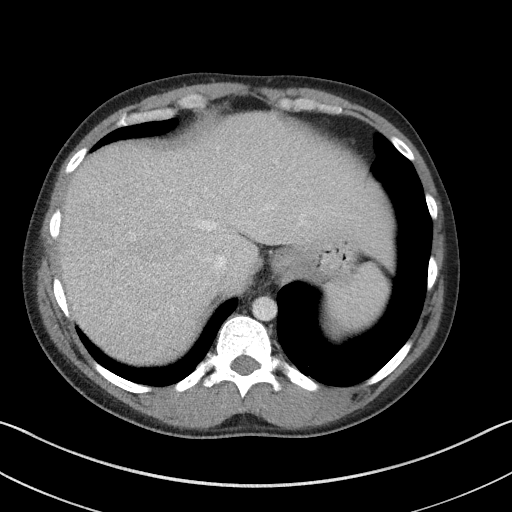
[im 85/100  lung]
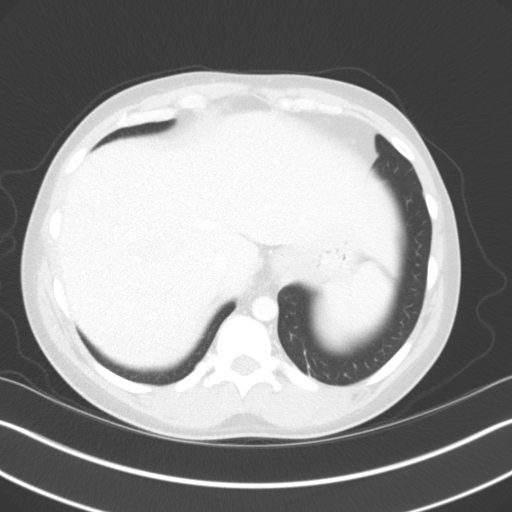
[im 92/100  soft-tissue]
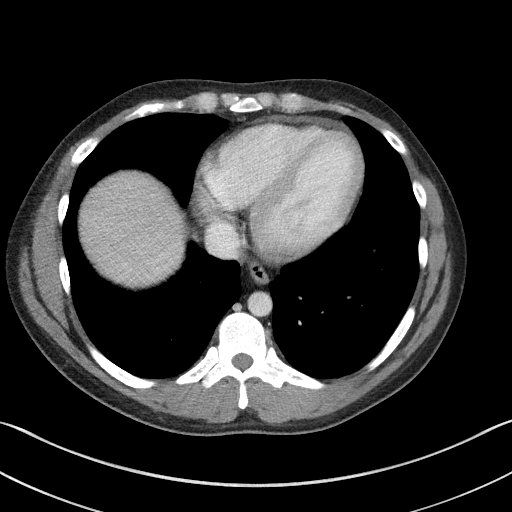
[im 92/100  lung]
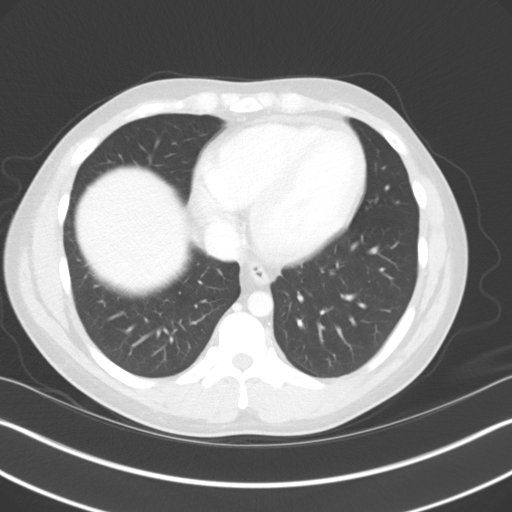

[12 of 32 positions shown; findings below may reference images not displayed]

FINDINGS: Lower chest: Lung bases are clear.

Hepatobiliary: The liver is within normal limits.

Gallbladder is unremarkable. No intrahepatic extrahepatic ductal
dilatation.

Pancreas: Within normal limits.

Spleen: Within normal limits.

Adrenals/Urinary Tract: Adrenal glands within normal limits.

Kidneys are within normal limits.  No hydronephrosis.

Bladder is mildly thick-walled although underdistended.

Stomach/Bowel: Stomach is within normal limits.

No evidence bowel obstruction.

Normal appendix (series 2/image 65).

Sigmoid diverticulosis, with associated mild pericolonic
inflammatory changes (series 2/image 57).

Status post partial left hemicolectomy with suture line in the left
lower abdomen (series 2/image 60). Foci of extraluminal gas adjacent
to the anastomosis (series 2/image 59).

Localized fluid along the anterior aspect of the rectum, with
possible discontinuity involving the anterior rectal wall (series
2/image 71). Fluid collection measures approximately 8.9 x 2.9 x
cm (series 5/image 56; series 6/image 84). No well-defined rim/wall.

Additional fluid/ gas along the anterior lower abdomen (series
2/image 66).

Overall appearance is considered worrisome for anastomotic leak or
localized perforation along the rectum. Macroscopic perforation
related to sigmoid diverticulitis is also possible.

Vascular/Lymphatic: No evidence of abdominal aortic aneurysm.

No suspicious abdominopelvic lymphadenopathy.

Reproductive: Prostate is unremarkable.

Other: Localized fluid/gas, as noted above.  No free air.

Surgical clips along the left upper abdomen.

Musculoskeletal: Visualized osseous structures are within normal
limits.
IMPRESSION: Status post partial left hemicolectomy.

Extraluminal fluid/gas in the anterior lower abdomen, as above.
Fluid collection measures approximately 8.9 x 2.9 x 4.4 cm, without
well-defined rim. Additional scattered foci of localized gas.

Overall appearance is considered worrisome for anastomotic leak or
localized perforation along the anterior rectum. Macroscopic
perforation related to sigmoid diverticulitis is also possible.

These results were called by telephone at the time of interpretation
on 09/05/2016 at [DATE] to Dr. SHANMUGA INOUE , who verbally
acknowledged these results.

## 2019-03-17 ENCOUNTER — Telehealth: Payer: Self-pay | Admitting: General Practice

## 2019-03-17 NOTE — Telephone Encounter (Signed)
Patient is calling and is having some pain in his lower abdomen, pain has been going on for a few months but only notices when he eats certain things, pain level being at a 7. Patient saw Dr. Burt Knack in the past. Please call patient and advise.

## 2019-03-17 NOTE — Telephone Encounter (Signed)
Patient states he is having sharp pains and pressure. Frequent bowel movements, loose but not diarrhea. Denies fever or chills. No blood in stool. Has noticed a little blood on tissue paper, seems to think he may have a tear. Denies bloating.  Have a bulge where his ostomy was. He had crab legs with a lot of butter and wonders if this could be the cause of the pain /discomfort.   Spoke with Dr.Pabon - added to schedule 03/19/2019. Go to emergency room if pain worsens or develops fever.

## 2019-03-19 ENCOUNTER — Other Ambulatory Visit
Admission: RE | Admit: 2019-03-19 | Discharge: 2019-03-19 | Disposition: A | Discharge status: Home / Self Care | Payer: Self-pay | Attending: Surgery | Admitting: Surgery

## 2019-03-19 ENCOUNTER — Other Ambulatory Visit: Payer: Self-pay

## 2019-03-19 ENCOUNTER — Encounter: Payer: Self-pay | Admitting: Surgery

## 2019-03-19 ENCOUNTER — Ambulatory Visit: Payer: Self-pay | Admitting: Surgery

## 2019-03-19 VITALS — BP 148/90 | HR 102 | Temp 98.6°F | Wt 223.0 lb

## 2019-03-19 DIAGNOSIS — K439 Ventral hernia without obstruction or gangrene: Secondary | ICD-10-CM | POA: Insufficient documentation

## 2019-03-19 LAB — CBC WITH DIFFERENTIAL/PLATELET
Abs Immature Granulocytes: 0.01 10*3/uL (ref 0.00–0.07)
Basophils Absolute: 0 10*3/uL (ref 0.0–0.1)
Basophils Relative: 0 %
Eosinophils Absolute: 0.1 10*3/uL (ref 0.0–0.5)
Eosinophils Relative: 1 %
HCT: 46.2 % (ref 39.0–52.0)
Hemoglobin: 15.2 g/dL (ref 13.0–17.0)
Immature Granulocytes: 0 %
Lymphocytes Relative: 40 %
Lymphs Abs: 1.9 10*3/uL (ref 0.7–4.0)
MCH: 28.8 pg (ref 26.0–34.0)
MCHC: 32.9 g/dL (ref 30.0–36.0)
MCV: 87.7 fL (ref 80.0–100.0)
Monocytes Absolute: 0.5 10*3/uL (ref 0.1–1.0)
Monocytes Relative: 11 %
Neutro Abs: 2.3 10*3/uL (ref 1.7–7.7)
Neutrophils Relative %: 48 %
Platelets: 190 10*3/uL (ref 150–400)
RBC: 5.27 MIL/uL (ref 4.22–5.81)
RDW: 14.1 % (ref 11.5–15.5)
WBC: 4.8 10*3/uL (ref 4.0–10.5)
nRBC: 0 % (ref 0.0–0.2)

## 2019-03-19 LAB — COMPREHENSIVE METABOLIC PANEL
ALT: 32 U/L (ref 0–44)
AST: 33 U/L (ref 15–41)
Albumin: 4.4 g/dL (ref 3.5–5.0)
Alkaline Phosphatase: 79 U/L (ref 38–126)
Anion gap: 8 (ref 5–15)
BUN: 21 mg/dL — ABNORMAL HIGH (ref 6–20)
CO2: 23 mmol/L (ref 22–32)
Calcium: 8.8 mg/dL — ABNORMAL LOW (ref 8.9–10.3)
Chloride: 108 mmol/L (ref 98–111)
Creatinine, Ser: 1.12 mg/dL (ref 0.61–1.24)
GFR calc Af Amer: >60 mL/min (ref >60)
GFR calc non Af Amer: >60 mL/min (ref >60)
Glucose, Bld: 98 mg/dL (ref 70–99)
Potassium: 4.3 mmol/L (ref 3.5–5.1)
Sodium: 139 mmol/L (ref 135–145)
Total Bilirubin: 0.5 mg/dL (ref 0.3–1.2)
Total Protein: 8.3 g/dL — ABNORMAL HIGH (ref 6.5–8.1)

## 2019-03-19 MED ORDER — CYCLOBENZAPRINE HCL 5 MG PO TABS: 5.0000 mg | ORAL_TABLET | Freq: Three times a day (TID) | ORAL | 0 refills | Status: DC

## 2019-03-19 MED ORDER — OXYCODONE-ACETAMINOPHEN 5-325 MG PO TABS: 1.0000 | ORAL_TABLET | ORAL | 0 refills | Status: AC | PRN

## 2019-03-19 NOTE — Patient Instructions (Addendum)
Ct scan on  03/21/19 at 12:45 in [place] . Nothing to eat or drink four hours prior. Pick up prep. Return in one week after scan ,jULY 1 @ 11:30.

## 2019-03-19 NOTE — Progress Notes (Signed)
Outpatient Surgical Follow Up  03/19/2019  Matthew Chaney is an 38 y.o. male.   Chief Complaint  Patient presents with  . Abdominal Pain    HPI: 38 year old male well-known to our practice initially underwent sigmoid colectomy for diverticulitis by Dr. Excell Seltzerooper 3 years ago complicated by anastomotic leak requiring a loop ileostomy.  2 years ago he underwent an ileostomy takedown without an issue.  He now comes with recent episode of right lower quadrant abdominal pain that apparently was trigger after eating some seafood.  No fevers no chills.  No nausea or vomiting.  He is tolerating p.o.  The pain is intermittent moderate intensity and crampy in nature.  He also feels a bulge to the right side of his abdominal wall.  He is having loose but normal bowel movements.    Past Medical History:  Diagnosis Date  . Anxiety   . Diverticulitis   . GERD (gastroesophageal reflux disease)     Past Surgical History:  Procedure Laterality Date  . COLON RESECTION SIGMOID N/A 08/15/2016   Procedure: COLON RESECTION SIGMOID;  Surgeon: [name] E Cooper, MD;  Location: [facility];  Service: General;  Laterality: N/A;  . COLONOSCOPY WITH PROPOFOL N/A 07/13/2016   Procedure: COLONOSCOPY WITH PROPOFOL;  Surgeon: Midge Miniumarren Wohl, MD;  [facility];  Service: Endoscopy;  Laterality: N/A;  . ILEOSTOMY CLOSURE N/A 02/07/2017   Procedure: ILEOSTOMY TAKEDOWN;  Surgeon: Lattie Hawooper, Richard E, MD;  Location: [facility];  Service: General;  Laterality: N/A;  . LAPAROTOMY N/A 09/06/2016   Procedure: DRAINAGE OF PELVIC ABSCESS, ILEOSTOMY;  Surgeon: [name] E Cooper, MD;  Location: [facility];  Service: General;  Laterality: N/A;  . NOSE SURGERY  2010    Family History  Problem Relation Age of Onset  . Diverticulitis Father   . Breast cancer Maternal Grandmother     Social History:  reports that he has been smoking cigars and cigarettes. He has been smoking about 0.25 packs per day. He has never used  smokeless tobacco. He reports current alcohol use. He reports that he does not use drugs.  Allergies:  Allergies  Allergen Reactions  . Other Other (See Comments)    Seasonal allergies:  Sneezing, watery eyes, itchy throat. Seasonal allergies:  Sneezing, watery eyes, itchy throat.  . Vicodin [Hydrocodone-Acetaminophen] Hives and Rash    Medications reviewed.    ROS Full ROS performed and is otherwise negative other than what is stated in HPI   BP (!) 148/90   Pulse (!) 102   Temp 98.6 F (37 C) (Skin)   Wt 223 lb (101.2 kg)   SpO2 98%   BMI 32.93 kg/m   Physical Exam Vitals signs and nursing note reviewed. Exam conducted with a chaperone present.  Constitutional:      General: He is not in acute distress.    Appearance: He is well-developed and normal weight. He is not diaphoretic.  HENT:     Head: Normocephalic and atraumatic.     Mouth/Throat:     Mouth: Mucous membranes are moist.     Pharynx: Oropharynx is clear.  Eyes:     General: No scleral icterus.    Extraocular Movements: Extraocular movements intact.  Cardiovascular:     Rate and Rhythm: Normal rate and regular rhythm.     Heart sounds: Normal heart sounds. No murmur.  Pulmonary:     Effort: Pulmonary effort is normal. No respiratory distress.     Breath sounds: No stridor. No rhonchi.  Abdominal:     General: Abdomen is protuberant. A surgical scar is present. Bowel sounds are normal. There is no distension or abdominal bruit. There are no signs of injury.     Palpations: Abdomen is soft. There is no splenomegaly.     Hernia: A hernia is present. Hernia is present in the ventral area.     Comments: Evidence of a large midline laparotomy scar.  There is a right lower quadrant transverse scar with ventral hernia that is tender to palpation but reducible.  No peritonitis.  Skin:    Capillary Refill: Capillary refill takes less than 2 seconds.  Neurological:     General: No focal deficit present.      Mental Status: He is alert and oriented to person, place, and time.  Psychiatric:        Mood and Affect: Mood normal. Mood is not anxious or depressed.        Behavior: Behavior normal.    Assessment/Plan: 38 year old male now with a ventral hernia from the ileostomy takedown incision.  Currently he is symptomatic.  I will start a work-up by obtaining a CT scan of the abdomen and pelvis.  This will allow [place] to delineate his anatomy and determine the best approach.  I will also obtain CBC and a CMP.  He reports that the ibuprofen is not helping enough and therefore I am going to give him 12 pills of Percocet.  I also going to include Flexeril as needed since he is having some muscle spasms.  I will see him next week with results of the CT scan and to determine which operative approach will soon best for him.  He will require surgical intervention at some point time but currently there is no need for any emergent intervention.    Greater than 50% of the 40 minutes  visit was spent in counseling/coordination of care   Caroleen Hamman, MD [place] Surgeon

## 2019-03-21 ENCOUNTER — Ambulatory Visit
Admission: RE | Admit: 2019-03-21 | Discharge: 2019-03-21 | Disposition: A | Discharge status: Home / Self Care | Payer: Self-pay | Source: Ambulatory Visit | Attending: Surgery | Admitting: Surgery

## 2019-03-21 ENCOUNTER — Other Ambulatory Visit: Payer: Self-pay

## 2019-03-21 DIAGNOSIS — K439 Ventral hernia without obstruction or gangrene: Secondary | ICD-10-CM | POA: Insufficient documentation

## 2019-03-21 MED ORDER — IOPAMIDOL (ISOVUE-300) INJECTION 61%
100.0000 mL | Freq: Once | INTRAVENOUS | Status: AC | PRN
  Administered 2019-03-21: 100 mL via INTRAVENOUS

## 2019-03-24 ENCOUNTER — Telehealth: Payer: Self-pay

## 2019-03-24 NOTE — Telephone Encounter (Signed)
Patient notified of cT results. Reminded to keep appointment to discuss surgery 03/26/2019

## 2019-03-26 ENCOUNTER — Ambulatory Visit (INDEPENDENT_AMBULATORY_CARE_PROVIDER_SITE_OTHER): Payer: Self-pay | Admitting: Surgery

## 2019-03-26 ENCOUNTER — Encounter: Payer: Self-pay | Admitting: *Deleted

## 2019-03-26 ENCOUNTER — Other Ambulatory Visit: Payer: Self-pay

## 2019-03-26 ENCOUNTER — Encounter: Payer: Self-pay | Admitting: Surgery

## 2019-03-26 VITALS — BP 147/107 | HR 91 | Temp 98.1°F | Resp 14 | Ht 69.0 in | Wt 224.0 lb

## 2019-03-26 DIAGNOSIS — K439 Ventral hernia without obstruction or gangrene: Secondary | ICD-10-CM

## 2019-03-26 MED ORDER — GABAPENTIN 300 MG PO CAPS: 300.0000 mg | ORAL_CAPSULE | Freq: Three times a day (TID) | ORAL | 0 refills | Status: DC

## 2019-03-26 NOTE — Patient Instructions (Signed)
We will schedule your surgery today for the Ventral Hernia.  Ventral Hernia  A ventral hernia is a bulge of tissue from inside the abdomen that pushes through a weak area of the muscles that form the front wall of the abdomen. The tissues inside the abdomen are inside a sac (peritoneum). These tissues include the small intestine, large intestine, and the fatty tissue that covers the intestines (omentum). Sometimes, the bulge that forms a hernia contains intestines. Other hernias contain only fat. Ventral hernias do not go away without surgical treatment. There are several types of ventral hernias. You may have:  A hernia at an incision site from previous abdominal surgery (incisional hernia).  A hernia just above the belly button (epigastric hernia), or at the belly button (umbilical hernia). These types of hernias can develop from heavy lifting or straining.  A hernia that comes and goes (reducible hernia). It may be visible only when you lift or strain. This type of hernia can be pushed back into the abdomen (reduced).  A hernia that traps abdominal tissue inside the hernia (incarcerated hernia). This type of hernia does not reduce.  A hernia that cuts off blood flow to the tissues inside the hernia (strangulated hernia). The tissues can start to die if this happens. This is a very painful bulge that cannot be reduced. A strangulated hernia is a medical emergency. What are the causes? This condition is caused by abdominal tissue putting pressure on an area of weakness in the abdominal muscles. What increases the risk? The following factors may make you more likely to develop this condition:  Being male.  Being 3560 or older.  Being overweight or obese.  Having had previous abdominal surgery, especially if there was an infection after surgery.  Having had an injury to the abdominal wall.  Having had several pregnancies.  Having a buildup of fluid inside the abdomen (ascites). What are  the signs or symptoms? The only symptom of a ventral hernia may be a painless bulge in the abdomen. A reducible hernia may be visible only when you strain, cough, or lift. Other symptoms may include:  Dull pain.  A feeling of pressure. Signs and symptoms of a strangulated hernia may include:  Increasing pain.  Nausea and vomiting.  Pain when pressing on the hernia.  The skin over the hernia turning red or purple.  Constipation.  Blood in the stool (feces). How is this diagnosed? This condition may be diagnosed based on:  Your symptoms.  Your medical history.  A physical exam. You may be asked to cough or strain while standing. These actions increase the pressure inside your abdomen and force the hernia through the opening in your muscles. Your health care provider may try to reduce the hernia by pressing on it.  Imaging studies, such as an ultrasound or CT scan. How is this treated? This condition is treated with surgery. If you have a strangulated hernia, surgery is done as soon as possible. If your hernia is small and not incarcerated, you may be asked to lose some weight before surgery. Follow these instructions at home:  Follow instructions from your health care provider about eating or drinking restrictions.  If you are overweight, your health care provider may recommend that you increase your activity level and eat a healthier diet.  Do not lift anything that is heavier than 10 lb (4.5 kg).  Return to your normal activities as told by your health care provider. Ask your health care provider what  activities are safe for you. You may need to avoid activities that increase pressure on your hernia.  Take over-the-counter and prescription medicines only as told by your health care provider.  Keep all follow-up visits as told by your health care provider. This is important. Contact a health care provider if:  Your hernia gets larger.  Your hernia becomes painful. Get  help right away if:  Your hernia becomes increasingly painful.  You have pain along with any of the following: ? Changes in skin color in the area of the hernia. ? Nausea. ? Vomiting. ? Fever. Summary  A ventral hernia is a bulge of tissue from inside the abdomen that pushes through a weak area of the muscles that form the front wall of the abdomen.  This condition is treated with surgery, which may be urgent depending on your hernia.  Do not lift anything that is heavier than 10 lb (4.5 kg), and follow activity instructions from your health care provider. This information is not intended to replace advice given to you by your health care provider. Make sure you discuss any questions you have with your health care provider. Document Released: 08/28/2012 Document Revised: 10/24/2017 Document Reviewed: 04/02/2017 Elsevier Patient Education  2020 [employer].  will schedule your surgery today for the Ventral Hernia.

## 2019-03-26 NOTE — Progress Notes (Signed)
Patient's surgery to be scheduled for 04-15-19 at [facility] with Dr. Dahlia Byes.   The patient is aware to have COVID-19 testing done on 04-11-19 at the [facility] drive thru ([address] ) between 8:00 am and 10:30 am. He is aware to isolate after, have no visitors, wash hands frequently, and avoid touching face.   The patient is aware he will be contacted by the [facility] to complete a phone interview sometime in the near future.  Patient aware to be NPO after midnight and have a driver.   He is aware to check in at the [place] entrance where he/she will be screened for the coronavirus and then sent to [facility].   Patient aware that he may have no visitors and driver will need to wait in the car due to COVID-19 restrictions.   The patient verbalizes understanding of the above.   The patient is aware to call the office should he have further questions.

## 2019-03-27 ENCOUNTER — Encounter: Payer: Self-pay | Admitting: Surgery

## 2019-03-27 NOTE — H&P (View-Only) (Signed)
Outpatient Surgical Follow Up  03/27/2019  Matthew Chaney is an 38 y.o. male.   Chief Complaint  Patient presents with  . 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  I have ordered a CT scan of the abdomen and pelvis that I have personally reviewed showing evidence of a complex hernia.  He does have small bowel stuck to the anterior abdominal wall along the midline.  The abdominal wall musculature is intact but there is retraction on the right side laterally. There is small bowel within the hernia sac but no evidence of incarceration or strangulation  Past Medical History:  Diagnosis Date  . Anxiety   . Diverticulitis   . GERD (gastroesophageal reflux disease)     Past Surgical History:  Procedure Laterality Date  . COLON RESECTION SIGMOID N/A 08/15/2016   Procedure: COLON RESECTION SIGMOID;  Surgeon: Florene Glen, MD;  Location: [facility];  Service: General;  Laterality: N/A;  . COLONOSCOPY WITH PROPOFOL N/A 07/13/2016   Procedure: COLONOSCOPY WITH PROPOFOL;  Surgeon: Lucilla Lame, MD;  Location: [facility];  Service: Endoscopy;  Laterality: N/A;  . ILEOSTOMY CLOSURE N/A 02/07/2017   Procedure: ILEOSTOMY TAKEDOWN;  Surgeon: Florene Glen, MD;  Location: [facility];  Service: General;  Laterality: N/A;  . LAPAROTOMY N/A 09/06/2016   Procedure: DRAINAGE OF PELVIC ABSCESS, ILEOSTOMY;  Surgeon: Florene Glen, MD;  Location: [facility];  Service: General;  Laterality: N/A;  . NOSE SURGERY  2010    Family History  Problem Relation Age of Onset  . Diverticulitis Father   . Breast cancer Maternal Grandmother     Social History:  reports that he has been smoking cigars and cigarettes. He has been smoking about 0.25 packs per day. He has never used smokeless tobacco. He reports current alcohol use. He reports that he does not use drugs.  Allergies:  Allergies  Allergen Reactions  . Other  Other (See Comments)    Seasonal allergies:  Sneezing, watery eyes, itchy throat. Seasonal allergies:  Sneezing, watery eyes, itchy throat.  . Vicodin [Hydrocodone-Acetaminophen] Hives and Rash    Medications reviewed.    ROS Full ROS performed and is otherwise negative other than what is stated in HPI   BP (!) 147/107   Pulse 91   Temp 98.1 F (36.7 C)   Resp 14   Ht 5\' 9"  (1.753 m)   Wt 224 lb (101.6 kg)   SpO2 98%   BMI 33.08 kg/m   Physical Exam Vitals signs and nursing note reviewed.  Constitutional:      Appearance: Normal appearance. He is normal weight.  Eyes:     General: No scleral icterus.       Right eye: No discharge.        Left eye: No discharge.  Neck:     Musculoskeletal: Normal range of motion. No neck rigidity.  Cardiovascular:     Rate and Rhythm: Normal rate and regular rhythm.  Pulmonary:     Effort: Pulmonary effort is normal. No respiratory distress.     Breath sounds: Normal breath sounds.  Abdominal:     General: Bowel sounds are normal. There is no distension.     Palpations: Abdomen is soft. There is no mass.     Tenderness: There is no guarding.     Hernia: A hernia is present.     Comments: Usable right lower quadrant  ventral hernia that is complex.  Mildly tender to palpation.  No peritonitis. Midline Laparotomy scar  Musculoskeletal: Normal range of motion.        General: No swelling or deformity.  Skin:    General: Skin is warm.     Capillary Refill: Capillary refill takes less than 2 seconds.     Coloration: Skin is not jaundiced.  Neurological:     General: No focal deficit present.     Mental Status: He is alert and oriented to person, place, and time.  Psychiatric:        Mood and Affect: Mood normal.        Behavior: Behavior normal.        Thought Content: Thought content normal.        Judgment: Judgment normal.        Assessment/Plan: 38-year-old male with ventral hernia from an ileostomy site.  Previous  hostile abdomen from diverticulitis. Is with the patient in detail about the need for ventral hernia repair.  Difficult choice between robotic versus open repair.  After a lengthy discussion with the patient we can try to initially do a robotic approach bearing in mind that I will have a low threshold for conversion to open.  He may benefit from a component release unilaterally, will depend on intraoperative findings. We will plan for robotic ventral hernia repair.  Risk of the procedure were discussed with the patient in detail.  Bleeding, infection, bowel injury, chronic pain and prolonged hospitalization were d/w the pt. HeUnderstands and wishes to proceed  Greater than 50% of the 40 minutes  visit was spent in counseling/coordination of care   Diego Pabon, MD FACS General Surgeon 

## 2019-03-27 NOTE — Progress Notes (Signed)
Outpatient Surgical Follow Up  03/27/2019  Matthew Chaney is an 38 y.o. male.   Chief Complaint  Patient presents with  . 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  I have ordered a CT scan of the abdomen and pelvis that I have personally reviewed showing evidence of a complex hernia.  He does have small bowel stuck to the anterior abdominal wall along the midline.  The abdominal wall musculature is intact but there is retraction on the right side laterally. There is small bowel within the hernia sac but no evidence of incarceration or strangulation  Past Medical History:  Diagnosis Date  . Anxiety   . Diverticulitis   . GERD (gastroesophageal reflux disease)     Past Surgical History:  Procedure Laterality Date  . COLON RESECTION SIGMOID N/A 08/15/2016   Procedure: COLON RESECTION SIGMOID;  Surgeon: Florene Glen, MD;  Location: [facility];  Service: General;  Laterality: N/A;  . COLONOSCOPY WITH PROPOFOL N/A 07/13/2016   Procedure: COLONOSCOPY WITH PROPOFOL;  Surgeon: Lucilla Lame, MD;  Location: [facility];  Service: Endoscopy;  Laterality: N/A;  . ILEOSTOMY CLOSURE N/A 02/07/2017   Procedure: ILEOSTOMY TAKEDOWN;  Surgeon: Florene Glen, MD;  Location: [facility];  Service: General;  Laterality: N/A;  . LAPAROTOMY N/A 09/06/2016   Procedure: DRAINAGE OF PELVIC ABSCESS, ILEOSTOMY;  Surgeon: Florene Glen, MD;  Location: [facility];  Service: General;  Laterality: N/A;  . NOSE SURGERY  2010    Family History  Problem Relation Age of Onset  . Diverticulitis Father   . Breast cancer Maternal Grandmother     Social History:  reports that he has been smoking cigars and cigarettes. He has been smoking about 0.25 packs per day. He has never used smokeless tobacco. He reports current alcohol use. He reports that he does not use drugs.  Allergies:  Allergies  Allergen Reactions  . Other  Other (See Comments)    Seasonal allergies:  Sneezing, watery eyes, itchy throat. Seasonal allergies:  Sneezing, watery eyes, itchy throat.  . Vicodin [Hydrocodone-Acetaminophen] Hives and Rash    Medications reviewed.    ROS Full ROS performed and is otherwise negative other than what is stated in HPI   BP (!) 147/107   Pulse 91   Temp 98.1 F (36.7 C)   Resp 14   Ht 5\' 9"  (1.753 m)   Wt 224 lb (101.6 kg)   SpO2 98%   BMI 33.08 kg/m   Physical Exam Vitals signs and nursing note reviewed.  Constitutional:      Appearance: Normal appearance. He is normal weight.  Eyes:     General: No scleral icterus.       Right eye: No discharge.        Left eye: No discharge.  Neck:     Musculoskeletal: Normal range of motion. No neck rigidity.  Cardiovascular:     Rate and Rhythm: Normal rate and regular rhythm.  Pulmonary:     Effort: Pulmonary effort is normal. No respiratory distress.     Breath sounds: Normal breath sounds.  Abdominal:     General: Bowel sounds are normal. There is no distension.     Palpations: Abdomen is soft. There is no mass.     Tenderness: There is no guarding.     Hernia: A hernia is present.     Comments: Usable right lower quadrant  ventral hernia that is complex.  Mildly tender to palpation.  No peritonitis. Midline Laparotomy scar  Musculoskeletal: Normal range of motion.        General: No swelling or deformity.  Skin:    General: Skin is warm.     Capillary Refill: Capillary refill takes less than 2 seconds.     Coloration: Skin is not jaundiced.  Neurological:     General: No focal deficit present.     Mental Status: He is alert and oriented to person, place, and time.  Psychiatric:        Mood and Affect: Mood normal.        Behavior: Behavior normal.        Thought Content: Thought content normal.        Judgment: Judgment normal.        Assessment/Plan: 38 year old male with ventral hernia from an ileostomy site.  Previous  hostile abdomen from diverticulitis. Is with the patient in detail about the need for ventral hernia repair.  Difficult choice between robotic versus open repair.  After a lengthy discussion with the patient we can try to initially do a robotic approach bearing in mind that I will have a low threshold for conversion to open.  He may benefit from a component release unilaterally, will depend on intraoperative findings. We will plan for robotic ventral hernia repair.  Risk of the procedure were discussed with the patient in detail.  Bleeding, infection, bowel injury, chronic pain and prolonged hospitalization were d/w the pt. HeUnderstands and wishes to proceed  Greater than 50% of the 40 minutes  visit was spent in counseling/coordination of care   Sterling Bigiego Sriansh Farra, MD [facility] General Surgeon

## 2019-04-08 ENCOUNTER — Other Ambulatory Visit: Payer: Self-pay

## 2019-04-09 ENCOUNTER — Encounter
Admission: RE | Admit: 2019-04-09 | Discharge: 2019-04-09 | Disposition: A | Discharge status: Home / Self Care | Payer: Self-pay | Source: Ambulatory Visit | Attending: Surgery | Admitting: Surgery

## 2019-04-09 ENCOUNTER — Other Ambulatory Visit: Payer: Self-pay

## 2019-04-09 NOTE — Patient Instructions (Signed)
Your procedure is scheduled on: 04/15/19 Report to Day Surgery.[facility] SECOND FLOOR To find out your arrival time please call [phone] between 1PM - 3PM on 04/14/19.  Remember: Instructions that are not followed completely may result in serious medical risk,  up to and including death, or upon the discretion of your surgeon and anesthesiologist your  surgery may need to be rescheduled.     _X__ 1. Do not eat food after midnight the night before your procedure.                 No gum chewing or hard candies. You may drink clear liquids up to 2 hours                 before you are scheduled to arrive for your surgery- DO not drink clear                 liquids within 2 hours of the start of your surgery.                 Clear Liquids include:  water, apple juice without pulp, clear carbohydrate                 drink such as Clearfast of Gatorade, Black Coffee or Tea (Do not add                 anything to coffee or tea).  __X__2.  On the morning of surgery brush your teeth with toothpaste and water, you                may rinse your mouth with mouthwash if you wish.  Do not swallow any toothpaste of mouthwash.     _X__ 3.  No Alcohol for 24 hours before or after surgery.   _X__ 4.  Do Not Smoke or use e-cigarettes For 24 Hours Prior to Your Surgery.                 Do not use any chewable tobacco products for at least 6 hours prior to                 surgery.  ____  5.  Bring all medications with you on the day of surgery if instructed.   _X___  6.  Notify your doctor if there is any change in your medical condition      (cold, fever, infections).     Do not wear jewelry, make-up, hairpins, clips or nail polish. Do not wear lotions, powders, or perfumes. You may wear deodorant. Do not shave 48 hours prior to surgery. Men may shave face and neck. Do not bring valuables to the hospital.    [facility] is not responsible for any belongings or  valuables.  Contacts, dentures or bridgework may not be worn into surgery. Leave your suitcase in the car. After surgery it may be brought to your room. For patients admitted to the hospital, discharge time is determined by your treatment team.   Patients discharged the day of surgery will not be allowed to drive home.   Please read over the following fact sheets that you were given:   Surgical Site Infection Prevention          _X___ Take these medicines the morning of surgery with A SIP OF WATER:    1. [place]  2.   3.   4.  5.  6.  ____ Fleet Enema (as directed)   _X___  Use CHG Soap as directed  ____ Use inhalers on the day of surgery  ____ Stop metformin 2 days prior to surgery    ____ Take 1/2 of usual insulin dose the night before surgery. No insulin the morning          of surgery.   ____ Stop Coumadin/Plavix/aspirin on   ____ Stop Anti-inflammatories on    ____ Stop supplements until after surgery.    ____ Bring C-Pap to the hospital.

## 2019-04-11 ENCOUNTER — Other Ambulatory Visit: Payer: Self-pay

## 2019-04-11 ENCOUNTER — Other Ambulatory Visit
Admission: RE | Admit: 2019-04-11 | Discharge: 2019-04-11 | Disposition: A | Discharge status: Home / Self Care | Payer: HRSA Program | Source: Ambulatory Visit | Attending: Surgery | Admitting: Surgery

## 2019-04-11 DIAGNOSIS — Z1159 Encounter for screening for other viral diseases: Secondary | ICD-10-CM | POA: Diagnosis present

## 2019-04-11 LAB — SARS CORONAVIRUS 2 (TAT 6-24 HRS): SARS Coronavirus 2: NEGATIVE

## 2019-04-14 MED ORDER — SODIUM CHLORIDE 0.9 % IV SOLN
2.0000 g | INTRAVENOUS | Status: AC
  Administered 2019-04-15: 08:00:00 2 g via INTRAVENOUS
  Filled 2019-04-14: qty 2

## 2019-04-15 ENCOUNTER — Ambulatory Visit: Payer: Medicaid Other | Admitting: Anesthesiology

## 2019-04-15 ENCOUNTER — Encounter
Admission: RE | Disposition: A | Discharge status: Home / Self Care | Payer: Self-pay | Source: Home / Self Care | Attending: Surgery

## 2019-04-15 ENCOUNTER — Ambulatory Visit
Admission: RE | Admit: 2019-04-15 | Discharge: 2019-04-15 | Disposition: A | Discharge status: Home / Self Care | Payer: Medicaid Other | Attending: Surgery | Admitting: Surgery

## 2019-04-15 ENCOUNTER — Encounter: Payer: Self-pay | Admitting: Anesthesiology

## 2019-04-15 DIAGNOSIS — F17290 Nicotine dependence, other tobacco product, uncomplicated: Secondary | ICD-10-CM | POA: Diagnosis not present

## 2019-04-15 DIAGNOSIS — Z932 Ileostomy status: Secondary | ICD-10-CM | POA: Diagnosis not present

## 2019-04-15 DIAGNOSIS — K660 Peritoneal adhesions (postprocedural) (postinfection): Secondary | ICD-10-CM | POA: Insufficient documentation

## 2019-04-15 DIAGNOSIS — K439 Ventral hernia without obstruction or gangrene: Secondary | ICD-10-CM | POA: Diagnosis not present

## 2019-04-15 DIAGNOSIS — K219 Gastro-esophageal reflux disease without esophagitis: Secondary | ICD-10-CM | POA: Insufficient documentation

## 2019-04-15 HISTORY — PX: XI ROBOTIC ASSISTED VENTRAL HERNIA: SHX6789

## 2019-04-15 SURGERY — REPAIR, HERNIA, VENTRAL, ROBOT-ASSISTED
Anesthesia: General

## 2019-04-15 MED ORDER — PROPOFOL 10 MG/ML IV BOLUS
INTRAVENOUS | Status: AC
  Filled 2019-04-15: qty 40

## 2019-04-15 MED ORDER — LIDOCAINE HCL (PF) 2 % IJ SOLN
INTRAMUSCULAR | Status: AC
  Filled 2019-04-15: qty 10

## 2019-04-15 MED ORDER — ROCURONIUM BROMIDE 100 MG/10ML IV SOLN
INTRAVENOUS | Status: DC | PRN
  Administered 2019-04-15: 50 mg via INTRAVENOUS
  Administered 2019-04-15: 30 mg via INTRAVENOUS
  Administered 2019-04-15: 10 mg via INTRAVENOUS
  Administered 2019-04-15: 20 mg via INTRAVENOUS

## 2019-04-15 MED ORDER — DEXMEDETOMIDINE HCL IN NACL 200 MCG/50ML IV SOLN
INTRAVENOUS | Status: DC | PRN
  Administered 2019-04-15: 4 ug via INTRAVENOUS
  Administered 2019-04-15 (×2): 8 ug via INTRAVENOUS

## 2019-04-15 MED ORDER — BUPIVACAINE-EPINEPHRINE (PF) 0.25% -1:200000 IJ SOLN
INTRAMUSCULAR | Status: AC
  Filled 2019-04-15: qty 30

## 2019-04-15 MED ORDER — FENTANYL CITRATE (PF) 100 MCG/2ML IJ SOLN
INTRAMUSCULAR | Status: AC
  Filled 2019-04-15: qty 2

## 2019-04-15 MED ORDER — ACETAMINOPHEN 500 MG PO TABS
ORAL_TABLET | ORAL | Status: AC
  Administered 2019-04-15: 1000 mg via ORAL
  Filled 2019-04-15: qty 2

## 2019-04-15 MED ORDER — PROPOFOL 10 MG/ML IV BOLUS
INTRAVENOUS | Status: DC | PRN
  Administered 2019-04-15: 200 mg via INTRAVENOUS

## 2019-04-15 MED ORDER — CELECOXIB 200 MG PO CAPS
ORAL_CAPSULE | ORAL | Status: AC
  Filled 2019-04-15: qty 1

## 2019-04-15 MED ORDER — ROCURONIUM BROMIDE 50 MG/5ML IV SOLN
INTRAVENOUS | Status: AC
  Filled 2019-04-15: qty 1

## 2019-04-15 MED ORDER — ONDANSETRON HCL 4 MG/2ML IJ SOLN
INTRAMUSCULAR | Status: AC
  Filled 2019-04-15: qty 2

## 2019-04-15 MED ORDER — GABAPENTIN 300 MG PO CAPS
300.0000 mg | ORAL_CAPSULE | ORAL | Status: AC
  Administered 2019-04-15: 300 mg via ORAL

## 2019-04-15 MED ORDER — DEXAMETHASONE SODIUM PHOSPHATE 10 MG/ML IJ SOLN
INTRAMUSCULAR | Status: AC
  Filled 2019-04-15: qty 1

## 2019-04-15 MED ORDER — CELECOXIB 200 MG PO CAPS
ORAL_CAPSULE | ORAL | Status: AC
  Administered 2019-04-15: 200 mg via ORAL
  Filled 2019-04-15: qty 2

## 2019-04-15 MED ORDER — ONDANSETRON HCL 4 MG/2ML IJ SOLN
INTRAMUSCULAR | Status: DC | PRN
  Administered 2019-04-15: 4 mg via INTRAVENOUS

## 2019-04-15 MED ORDER — BUPIVACAINE LIPOSOME 1.3 % IJ SUSP
INTRAMUSCULAR | Status: DC | PRN
  Administered 2019-04-15: 20 mL

## 2019-04-15 MED ORDER — CHLORHEXIDINE GLUCONATE CLOTH 2 % EX PADS
6.0000 | MEDICATED_PAD | Freq: Once | CUTANEOUS | Status: AC
  Administered 2019-04-15: 6 via TOPICAL

## 2019-04-15 MED ORDER — BUPIVACAINE LIPOSOME 1.3 % IJ SUSP: 20.0000 mL | Freq: Once | INTRAMUSCULAR | Status: DC

## 2019-04-15 MED ORDER — FENTANYL CITRATE (PF) 100 MCG/2ML IJ SOLN
INTRAMUSCULAR | Status: AC
  Administered 2019-04-15: 12:00:00 25 ug via INTRAVENOUS
  Filled 2019-04-15: qty 2

## 2019-04-15 MED ORDER — BUPIVACAINE LIPOSOME 1.3 % IJ SUSP
INTRAMUSCULAR | Status: AC
  Filled 2019-04-15: qty 20

## 2019-04-15 MED ORDER — KETOROLAC TROMETHAMINE 30 MG/ML IJ SOLN
INTRAMUSCULAR | Status: AC
  Administered 2019-04-15: 30 mg via INTRAVENOUS
  Filled 2019-04-15: qty 1

## 2019-04-15 MED ORDER — CELECOXIB 200 MG PO CAPS
200.0000 mg | ORAL_CAPSULE | ORAL | Status: AC
  Administered 2019-04-15: 200 mg via ORAL

## 2019-04-15 MED ORDER — ROCURONIUM BROMIDE 50 MG/5ML IV SOLN
INTRAVENOUS | Status: AC
  Filled 2019-04-15: qty 2

## 2019-04-15 MED ORDER — LACTATED RINGERS IV SOLN
INTRAVENOUS | Status: DC
  Administered 2019-04-15: 07:00:00 via INTRAVENOUS

## 2019-04-15 MED ORDER — SUGAMMADEX SODIUM 500 MG/5ML IV SOLN
INTRAVENOUS | Status: DC | PRN
  Administered 2019-04-15: 400 mg via INTRAVENOUS

## 2019-04-15 MED ORDER — GABAPENTIN 300 MG PO CAPS
ORAL_CAPSULE | ORAL | Status: AC
  Filled 2019-04-15: qty 1

## 2019-04-15 MED ORDER — FENTANYL CITRATE (PF) 100 MCG/2ML IJ SOLN
INTRAMUSCULAR | Status: DC | PRN
  Administered 2019-04-15 (×2): 50 ug via INTRAVENOUS
  Administered 2019-04-15: 100 ug via INTRAVENOUS
  Administered 2019-04-15 (×2): 50 ug via INTRAVENOUS
  Administered 2019-04-15: 100 ug via INTRAVENOUS

## 2019-04-15 MED ORDER — LACTATED RINGERS IV SOLN
INTRAVENOUS | Status: DC | PRN
  Administered 2019-04-15: 08:00:00 via INTRAVENOUS

## 2019-04-15 MED ORDER — CHLORHEXIDINE GLUCONATE CLOTH 2 % EX PADS: 6.0000 | MEDICATED_PAD | Freq: Once | CUTANEOUS | Status: DC

## 2019-04-15 MED ORDER — OXYCODONE HCL 5 MG/5ML PO SOLN: 5.0000 mg | Freq: Once | ORAL | Status: AC | PRN

## 2019-04-15 MED ORDER — OXYCODONE HCL 5 MG PO TABS
5.0000 mg | ORAL_TABLET | Freq: Once | ORAL | Status: AC | PRN
  Administered 2019-04-15: 5 mg via ORAL

## 2019-04-15 MED ORDER — FENTANYL CITRATE (PF) 100 MCG/2ML IJ SOLN
INTRAMUSCULAR | Status: AC
  Administered 2019-04-15: 11:00:00 25 ug via INTRAVENOUS
  Filled 2019-04-15: qty 2

## 2019-04-15 MED ORDER — MIDAZOLAM HCL 2 MG/2ML IJ SOLN
INTRAMUSCULAR | Status: AC
  Filled 2019-04-15: qty 2

## 2019-04-15 MED ORDER — FENTANYL CITRATE (PF) 100 MCG/2ML IJ SOLN
25.0000 ug | INTRAMUSCULAR | Status: AC | PRN
  Administered 2019-04-15: 50 ug via INTRAVENOUS
  Administered 2019-04-15 (×6): 25 ug via INTRAVENOUS

## 2019-04-15 MED ORDER — SEVOFLURANE IN SOLN
RESPIRATORY_TRACT | Status: AC
  Filled 2019-04-15: qty 250

## 2019-04-15 MED ORDER — MIDAZOLAM HCL 2 MG/2ML IJ SOLN
INTRAMUSCULAR | Status: DC | PRN
  Administered 2019-04-15: 2 mg via INTRAVENOUS

## 2019-04-15 MED ORDER — SUGAMMADEX SODIUM 500 MG/5ML IV SOLN
INTRAVENOUS | Status: AC
  Filled 2019-04-15: qty 5

## 2019-04-15 MED ORDER — BUPIVACAINE-EPINEPHRINE 0.25% -1:200000 IJ SOLN
INTRAMUSCULAR | Status: DC | PRN
  Administered 2019-04-15: 30 mL

## 2019-04-15 MED ORDER — GABAPENTIN 300 MG PO CAPS
ORAL_CAPSULE | ORAL | Status: AC
  Administered 2019-04-15: 07:00:00 300 mg via ORAL
  Filled 2019-04-15: qty 1

## 2019-04-15 MED ORDER — DEXAMETHASONE SODIUM PHOSPHATE 10 MG/ML IJ SOLN
INTRAMUSCULAR | Status: DC | PRN
  Administered 2019-04-15: 10 mg via INTRAVENOUS

## 2019-04-15 MED ORDER — LIDOCAINE HCL (CARDIAC) PF 100 MG/5ML IV SOSY
PREFILLED_SYRINGE | INTRAVENOUS | Status: DC | PRN
  Administered 2019-04-15: 100 mg via INTRAVENOUS

## 2019-04-15 MED ORDER — OXYCODONE HCL 5 MG PO TABS
ORAL_TABLET | ORAL | Status: AC
  Filled 2019-04-15: qty 1

## 2019-04-15 MED ORDER — ACETAMINOPHEN 500 MG PO TABS
1000.0000 mg | ORAL_TABLET | ORAL | Status: AC
  Administered 2019-04-15: 1000 mg via ORAL

## 2019-04-15 MED ORDER — OXYCODONE-ACETAMINOPHEN 5-325 MG PO TABS: 1.0000 | ORAL_TABLET | ORAL | 0 refills | Status: DC | PRN

## 2019-04-15 MED ORDER — KETOROLAC TROMETHAMINE 30 MG/ML IJ SOLN
30.0000 mg | Freq: Once | INTRAMUSCULAR | Status: AC
  Administered 2019-04-15: 30 mg via INTRAVENOUS

## 2019-04-15 SURGICAL SUPPLY — 53 items
BINDER ABDOMINAL 12 ML 46-62 (SOFTGOODS) ×2 IMPLANT
BLADE SURG SZ11 CARB STEEL (BLADE) ×3 IMPLANT
CANISTER SUCT 1200ML W/VALVE (MISCELLANEOUS) ×3 IMPLANT
CHLORAPREP W/TINT 26 (MISCELLANEOUS) ×3 IMPLANT
CORD BIP STRL DISP 12FT (MISCELLANEOUS) ×3 IMPLANT
COVER TIP SHEARS 8 DVNC (MISCELLANEOUS) ×1 IMPLANT
COVER TIP SHEARS 8MM DA VINCI (MISCELLANEOUS) ×2
COVER WAND RF STERILE (DRAPES) ×3 IMPLANT
DEFOGGER SCOPE WARMER CLEARIFY (MISCELLANEOUS) ×3 IMPLANT
DERMABOND ADVANCED (GAUZE/BANDAGES/DRESSINGS) ×2
DERMABOND ADVANCED .7 DNX12 (GAUZE/BANDAGES/DRESSINGS) ×1 IMPLANT
DRAPE 3/4 80X56 (DRAPES) ×1 IMPLANT
DRAPE ARM DVNC X/XI (DISPOSABLE) ×3 IMPLANT
DRAPE COLUMN DVNC XI (DISPOSABLE) ×1 IMPLANT
DRAPE DA VINCI XI ARM (DISPOSABLE) ×8
DRAPE DA VINCI XI COLUMN (DISPOSABLE) ×2
ELECT CAUTERY BLADE 6.4 (BLADE) ×3 IMPLANT
ELECT REM PT RETURN 9FT ADLT (ELECTROSURGICAL) ×3
ELECTRODE REM PT RTRN 9FT ADLT (ELECTROSURGICAL) ×1 IMPLANT
GLOVE BIO SURGEON STRL SZ7 (GLOVE) ×8 IMPLANT
GOWN STRL REUS W/ TWL LRG LVL3 (GOWN DISPOSABLE) ×3 IMPLANT
GOWN STRL REUS W/TWL LRG LVL3 (GOWN DISPOSABLE) ×6
GRASPER SUT TROCAR 14GX15 (MISCELLANEOUS) ×1 IMPLANT
IRRIGATION STRYKERFLOW (MISCELLANEOUS) IMPLANT
IRRIGATOR STRYKERFLOW (MISCELLANEOUS)
IV NS 1000ML (IV SOLUTION)
IV NS 1000ML BAXH (IV SOLUTION) IMPLANT
KIT PINK PAD W/HEAD ARE REST (MISCELLANEOUS) ×3
KIT PINK PAD W/HEAD ARM REST (MISCELLANEOUS) ×1 IMPLANT
L-HOOK LAP DISP 36CM (ELECTROSURGICAL) ×3
LABEL OR SOLS (LABEL) ×3 IMPLANT
LHOOK LAP DISP 36CM (ELECTROSURGICAL) IMPLANT
MESH VENT LT ST 10.2X15.2CM EL (Mesh General) ×2 IMPLANT
NEEDLE HYPO 22GX1.5 SAFETY (NEEDLE) ×3 IMPLANT
OBTURATOR OPTICAL STANDARD 8MM (TROCAR) ×2
OBTURATOR OPTICAL STND 8 DVNC (TROCAR) ×1
OBTURATOR OPTICALSTD 8 DVNC (TROCAR) ×1 IMPLANT
PACK LAP CHOLECYSTECTOMY (MISCELLANEOUS) ×3 IMPLANT
PENCIL ELECTRO HAND CTR (MISCELLANEOUS) ×3 IMPLANT
SCISSORS METZENBAUM CVD 33 (INSTRUMENTS) ×2 IMPLANT
SEAL CANN UNIV 5-8 DVNC XI (MISCELLANEOUS) ×3 IMPLANT
SEAL XI 5MM-8MM UNIVERSAL (MISCELLANEOUS) ×6
SOLUTION ELECTROLUBE (MISCELLANEOUS) ×3 IMPLANT
SPONGE LAP 18X18 RF (DISPOSABLE) ×3 IMPLANT
SUT DVC VLOC 180 0 12IN GS21 (SUTURE) ×6
SUT MNCRL 4-0 (SUTURE) ×2
SUT MNCRL 4-0 27XMFL (SUTURE) ×1
SUT VICRYL 0 AB UR-6 (SUTURE) ×6 IMPLANT
SUT VLOC 90 2/L VL 12 GS22 (SUTURE) ×6 IMPLANT
SUTURE DVC VLC 180 0 12IN GS21 (SUTURE) ×2 IMPLANT
SUTURE MNCRL 4-0 27XMF (SUTURE) ×1 IMPLANT
TROCAR 130MM GELPORT  DAV (MISCELLANEOUS) ×3 IMPLANT
TUBING EVAC SMOKE HEATED PNEUM (TUBING) ×3 IMPLANT

## 2019-04-15 NOTE — Anesthesia Procedure Notes (Signed)
Procedure Name: Intubation Date/Time: 04/15/2019 7:37 AM Performed by: Lavone Orn, CRNA Pre-anesthesia Checklist: Patient identified, Emergency Drugs available, Suction available, Patient being monitored and Timeout performed Patient Re-evaluated:Patient Re-evaluated prior to induction Oxygen Delivery Method: Circle system utilized Preoxygenation: Pre-oxygenation with 100% oxygen Induction Type: IV induction Ventilation: Mask ventilation without difficulty Laryngoscope Size: Mac and 4 Grade View: Grade I Tube type: Oral Number of attempts: 1 Airway Equipment and Method: Stylet Placement Confirmation: ETT inserted through vocal cords under direct vision,  positive ETCO2 and breath sounds checked- equal and bilateral Secured at: 25 cm Tube secured with: Tape Dental Injury: Teeth and Oropharynx as per pre-operative assessment

## 2019-04-15 NOTE — Discharge Instructions (Addendum)
Laparoscopic Ventral Hernia Repair, Care After This sheet gives you information about how to care for yourself after your procedure. Your health care provider may also give you more specific instructions. If you have problems or questions, contact your health care provider. What can I expect after the procedure? After the procedure, it is common to have:  Pain, discomfort, or soreness. Follow these instructions at home: Incision care   Follow instructions from your health care provider about how to take care of your incision. Make sure you: ? Wash your hands with soap and water before you change your bandage (dressing) or before you touch your abdomen. If soap and water are not available, use hand sanitizer. ? Change your dressing as told by your health care provider. ? Leave stitches (sutures), skin glue, or adhesive strips in place. These skin closures may need to stay in place for 2 weeks or longer. If adhesive strip edges start to loosen and curl up, you may trim the loose edges. Do not remove adhesive strips completely unless your health care provider tells you to do that.  Check your incision area every day for signs of infection. Check for: ? Redness, swelling, or pain. ? Fluid or blood. ? Warmth. ? Pus or a bad smell. Bathing   Do not take baths, swim, or use a hot tub until your health care provider approves. Ask your health care provider if you can take showers. You may only be allowed to take sponge baths for bathing.  Keep your bandage (dressing) dry until your health care provider says it can be removed. Activity  Do not lift anything that is heavier than 10 lb (4.5 kg) until your health care provider approves.  Do not drive or use heavy machinery while taking prescription pain medicine. Ask your health care provider when it is safe for you to drive or use heavy machinery.  Do not drive for 24 hours if you were given a medicine to help you relax (sedative) during your  procedure.  Rest as told by your health care provider. You may return to your normal activities when your health care provider approves. General instructions  Take over-the-counter and prescription medicines only as told by your health care provider.  To prevent or treat constipation while you are taking prescription pain medicine, your health care provider may recommend that you: ? Take over-the-counter or prescription medicines. ? Eat foods that are high in fiber, such as fresh fruits and vegetables, whole grains, and beans. ? Limit foods that are high in fat and processed sugars, such as fried and sweet foods.  Drink enough fluid to keep your urine clear or pale yellow.  Hold a pillow over your abdomen when you cough or sneeze. This helps with pain.  Keep all follow-up visits as told by your health care provider. This is important. Contact a health care provider if:  You have: ? A fever or chills. ? Redness, swelling, or pain around your incision. ? Fluid or blood coming from your incision. ? Pus or a bad smell coming from your incision. ? Pain that gets worse or does not get better with medicine. ? Nausea or vomiting. ? A cough. ? Shortness of breath.  Your incision feels warm to the touch.  You have not had a bowel movement in three days.  You are not able to urinate. Get help right away if:  You have severe pain in your abdomen.  You have persistent nausea and vomiting.  You have   redness, warmth, or pain in your leg.  You have chest pain.  You have trouble breathing. Summary  After this procedure, it is common to have pain, discomfort, or soreness.  Follow instructions from your health care provider about how to take care of your incision.  Check your incision area every day for signs of infection. Report any signs of infection to your health care provider.  Keep all follow-up visits as told by your health care provider. This is important. This information  is not intended to replace advice given to you by your health care provider. Make sure you discuss any questions you have with your health care provider.  AMBULATORY SURGERY  DISCHARGE INSTRUCTIONS   1) The drugs that you were given will stay in your system until tomorrow so for the next 24 hours you should not:  A) Drive an automobile B) Make any legal decisions C) Drink any alcoholic beverage   2) You may resume regular meals tomorrow.  Today it is better to start with liquids and gradually work up to solid foods.  You may eat anything you prefer, but it is better to start with liquids, then soup and crackers, and gradually work up to solid foods.   3) Please notify your doctor immediately if you have any unusual bleeding, trouble breathing, redness and pain at the surgery site, drainage, fever, or pain not relieved by medication.    4) Additional Instructions:        Please contact your physician with any problems or Same Day Surgery at [phone], Monday through Friday 6 am to 4 pm, or [facility] Main number at [phone]. Document Released: 08/28/2012 Document Revised: 08/24/2017 Document Reviewed: 05/03/2016 Elsevier Patient Education  2020 [employer]  

## 2019-04-15 NOTE — Op Note (Signed)
Robotic assisted laparoscopic ventral hernia Repair IPOM using  10x15cm ventralight BARD mesh   Pre-operative Diagnosis: ventral hernia from ileostomy RLQ   Post-operative Diagnosis: same   Surgeon: Caroleen Hamman, MD FACS   Anesthesia: Gen. with endotracheal tube     Findings: Significant adhesions from the omentum to the abdominal wall and from the small bowel to the abdominal wall. Right lower quadrant ventral hernia defect measuring 4.5 cm    Estimated Blood Loss: 81EH         Complications: none             Procedure Details  The patient was seen again in the Holding Room. The benefits, complications, treatment options, and expected outcomes were discussed with the patient. The risks of bleeding, infection, recurrence of symptoms, failure to resolve symptoms, bowel injury, mesh placement, mesh infection, any of which could require further surgery were reviewed with the patient. The likelihood of improving the patient's symptoms with return to their baseline status is good.  The patient and/or family concurred with the proposed plan, giving informed consent.  The patient was taken to Operating Room, identified and the procedure verified.  A Time Out was held and the above information confirmed.   Prior to the induction of general anesthesia, antibiotic prophylaxis was administered. VTE prophylaxis was in place. General endotracheal anesthesia was then administered and tolerated well. After the induction, the abdomen was prepped with Chloraprep and draped in the sterile fashion. The patient was positioned in the supine position.   We used a left upper quadrant subcostal incision and using a cutdown technique were able to identify the fascia both anteriorly and posteriorly elevated incised and 2 stay sutures were placed.  Hassan trocar inserted and pneumoperitoneum obtained.  No hemodynamic compromise.   3 additional 8 mm ports were placed under direct visualization.   Significant  adhesions were visualized and extreme caution was taken.  Meticulous dissection was done using a combination of scissors and cautery. Were able to obtain a good dissection window. I visualized the hernia and there was an epigastric ventral hernia measuring approximately 4.5 cm.  At this time I went ahead and inserted the  mesh with an echo location.   The robot was brought to the surgical field and docked in the standard fashion.  We made sure that all instrumentation was kept under direct vision at all times and there was no collision between the arms.  I scrubbed out and went to the console.   Confirm and measured that the defect was 4.5 cm.  .  Using a 0 V Tratafix suture we closed the ventral defect primarily.  The PMI was used to pierce the defect in the center.  Using the mesh and the echo location device we were able to bring the mesh towards the abdominal wall..   The mesh was secured circumferentially to the abdominal wall using 2 OV lock in the standard fashion.   The mesh layed really nicely against the  abdominal wall. A second look laparoscopy revealed no evidence of intra-abdominal injury.    All the needles and foreign objects were removed under direct visualization.  The instruments were removed and the robot was undocked.  I scrubbed back in, The laparoscopic ports were removed under direct visualization and the pneumoperitoneum was deflated.   Incisions were closed with  4-0 Monocryl  And the fascial sutures approximated in the standard fashion Dermabond was used to coat the skin. Liposomal  Marcainewas used to inject all  the incision sites. Patient tolerated procedure well and there were no immediate complications. Needle and laparotomy counts were correct    Sterling Bigiego Jayveon Convey, MD, FACS

## 2019-04-15 NOTE — Anesthesia Preprocedure Evaluation (Signed)
Anesthesia Evaluation  Patient identified by MRN, date of birth, ID band Patient awake    Reviewed: Allergy & Precautions, H&P , NPO status , Patient's Chart, lab work & pertinent test results  History of Anesthesia Complications Negative for: history of anesthetic complications  Airway Mallampati: II  TM Distance: >3 FB Neck ROM: full    Dental  (+) Chipped, Poor Dentition   Pulmonary neg shortness of breath, Current Smoker,           Cardiovascular Exercise Tolerance: Good (-) angina(-) Past MI and (-) DOE      Neuro/Psych PSYCHIATRIC DISORDERS negative neurological ROS     GI/Hepatic Neg liver ROS, GERD  Medicated and Controlled,  Endo/Other  negative endocrine ROS  Renal/GU      Musculoskeletal   Abdominal   Peds  Hematology negative hematology ROS (+)   Anesthesia Other Findings Past Medical History: No date: Anxiety No date: Diverticulitis No date: GERD (gastroesophageal reflux disease)  Past Surgical History: 08/15/2016: COLON RESECTION SIGMOID; N/A     Comment:  Procedure: COLON RESECTION SIGMOID;  Surgeon: Florene Glen, MD;  Location: [facility];  Service: General;                Laterality: N/A; 07/13/2016: COLONOSCOPY WITH PROPOFOL; N/A     Comment:  Procedure: COLONOSCOPY WITH PROPOFOL;  Surgeon: Lucilla Lame, MD;  Location: [place];  Service:               Endoscopy;  Laterality: N/A; 02/07/2017: ILEOSTOMY CLOSURE; N/A     Comment:  Procedure: ILEOSTOMY TAKEDOWN;  Surgeon: Florene Glen, MD;  Location: [facility];  Service: General;                Laterality: N/A; 09/06/2016: LAPAROTOMY; N/A     Comment:  Procedure: DRAINAGE OF PELVIC ABSCESS, ILEOSTOMY;                Surgeon: Florene Glen, MD;  Location: [facility];                Service: General;  Laterality: N/A; 2010: NOSE SURGERY     Reproductive/Obstetrics negative OB  ROS                             Anesthesia Physical Anesthesia Plan  ASA: II  Anesthesia Plan: General ETT   Post-op Pain Management:    Induction: Intravenous  PONV Risk Score and Plan: Ondansetron, Dexamethasone, Midazolam and Treatment may vary due to age or medical condition  Airway Management Planned: Oral ETT  Additional Equipment:   Intra-op Plan:   Post-operative Plan: Extubation in OR  Informed Consent: I have reviewed the patients History and Physical, chart, labs and discussed the procedure including the risks, benefits and alternatives for the proposed anesthesia with the patient or authorized representative who has indicated his/her understanding and acceptance.     Dental Advisory Given  Plan Discussed with: Anesthesiologist, CRNA and Surgeon  Anesthesia Plan Comments: (Patient consented for risks of anesthesia including but not limited to:  - adverse reactions to medications - damage to teeth, lips or other oral mucosa - sore throat or hoarseness - Damage to heart, brain, lungs  or loss of life  Patient voiced understanding.)        Anesthesia Quick Evaluation

## 2019-04-15 NOTE — Transfer of Care (Signed)
Immediate Anesthesia Transfer of Care Note  Patient: Matthew Chaney  Procedure(s) Performed: XI ROBOTIC ASSISTED VENTRAL HERNIA (N/A )  Patient Location: PACU  Anesthesia Type:General  Level of Consciousness: drowsy  Airway & Oxygen Therapy: Patient Spontanous Breathing and Patient connected to face mask oxygen  Post-op Assessment: Report given to RN and Post -op Vital signs reviewed and stable  Post vital signs: stable  Last Vitals:  Vitals Value Taken Time  BP 163/108 04/15/19 1035  Temp 36.5 C 04/15/19 1035  Pulse 105 04/15/19 1039  Resp 23 04/15/19 1039  SpO2 100 % 04/15/19 1039  Vitals shown include unvalidated device data.  Last Pain:  Vitals:   04/15/19 1035  TempSrc:   PainSc: Asleep         Complications: No apparent anesthesia complications

## 2019-04-15 NOTE — Interval H&P Note (Signed)
History and Physical Interval Note:  04/15/2019 7:14 AM  Gwenlyn Perking  has presented today for surgery, with the diagnosis of K43.9 VENTRAL HERNIA.  The various methods of treatment have been discussed with the patient and family. After consideration of risks, benefits and other options for treatment, the patient has consented to  Procedure(s): XI ROBOTIC [place] (N/A) as a surgical intervention.  The patient's history has been reviewed, patient examined, no change in status, stable for surgery.  I have reviewed the patient's chart and labs.  Questions were answered to the patient's satisfaction.     [place]

## 2019-04-15 NOTE — Anesthesia Postprocedure Evaluation (Signed)
Anesthesia Post Note  Patient: Matthew Chaney  Procedure(s) Performed: XI ROBOTIC ASSISTED VENTRAL HERNIA (N/A )  Patient location during evaluation: PACU Anesthesia Type: General Level of consciousness: awake and alert and oriented Pain management: pain level controlled Vital Signs Assessment: post-procedure vital signs reviewed and stable Respiratory status: spontaneous breathing, nonlabored ventilation and respiratory function stable Cardiovascular status: blood pressure returned to baseline and stable Postop Assessment: no signs of nausea or vomiting Anesthetic complications: no     Last Vitals:  Vitals:   04/15/19 1206 04/15/19 1356  BP: (!) 155/107 (!) 152/103  Pulse: (!) 101 99  Resp: 18 18  Temp: 36.7 C   SpO2: 96% 100%    Last Pain:  Vitals:   04/15/19 1356  TempSrc:   PainSc: 4                  Drake Wuertz

## 2019-04-15 NOTE — Anesthesia Post-op Follow-up Note (Signed)
Anesthesia QCDR form completed.        

## 2019-04-17 ENCOUNTER — Emergency Department: Payer: Medicaid Other

## 2019-04-17 ENCOUNTER — Encounter: Payer: Self-pay | Admitting: Emergency Medicine

## 2019-04-17 ENCOUNTER — Other Ambulatory Visit: Payer: Self-pay

## 2019-04-17 ENCOUNTER — Observation Stay: Payer: Medicaid Other

## 2019-04-17 ENCOUNTER — Observation Stay
Admission: EM | Admit: 2019-04-17 | Discharge: 2019-04-18 | Disposition: A | Discharge status: Home / Self Care | Payer: Medicaid Other | Attending: Surgery | Admitting: Surgery

## 2019-04-17 DIAGNOSIS — Z9889 Other specified postprocedural states: Secondary | ICD-10-CM | POA: Insufficient documentation

## 2019-04-17 DIAGNOSIS — F17210 Nicotine dependence, cigarettes, uncomplicated: Secondary | ICD-10-CM | POA: Diagnosis not present

## 2019-04-17 DIAGNOSIS — Z1159 Encounter for screening for other viral diseases: Secondary | ICD-10-CM | POA: Insufficient documentation

## 2019-04-17 DIAGNOSIS — Z885 Allergy status to narcotic agent status: Secondary | ICD-10-CM | POA: Insufficient documentation

## 2019-04-17 DIAGNOSIS — K219 Gastro-esophageal reflux disease without esophagitis: Secondary | ICD-10-CM | POA: Diagnosis not present

## 2019-04-17 DIAGNOSIS — F17290 Nicotine dependence, other tobacco product, uncomplicated: Secondary | ICD-10-CM | POA: Insufficient documentation

## 2019-04-17 DIAGNOSIS — K567 Ileus, unspecified: Secondary | ICD-10-CM | POA: Diagnosis not present

## 2019-04-17 DIAGNOSIS — Z0184 Encounter for antibody response examination: Secondary | ICD-10-CM | POA: Insufficient documentation

## 2019-04-17 DIAGNOSIS — F419 Anxiety disorder, unspecified: Secondary | ICD-10-CM | POA: Insufficient documentation

## 2019-04-17 DIAGNOSIS — Z79899 Other long term (current) drug therapy: Secondary | ICD-10-CM | POA: Insufficient documentation

## 2019-04-17 DIAGNOSIS — G8918 Other acute postprocedural pain: Secondary | ICD-10-CM

## 2019-04-17 LAB — CBC WITH DIFFERENTIAL/PLATELET
Abs Immature Granulocytes: 0.01 10*3/uL (ref 0.00–0.07)
Basophils Absolute: 0 10*3/uL (ref 0.0–0.1)
Basophils Relative: 0 %
Eosinophils Absolute: 0 10*3/uL (ref 0.0–0.5)
Eosinophils Relative: 0 %
HCT: 47.3 % (ref 39.0–52.0)
Hemoglobin: 15.8 g/dL (ref 13.0–17.0)
Immature Granulocytes: 0 %
Lymphocytes Relative: 17 %
Lymphs Abs: 1.6 10*3/uL (ref 0.7–4.0)
MCH: 28.1 pg (ref 26.0–34.0)
MCHC: 33.4 g/dL (ref 30.0–36.0)
MCV: 84.2 fL (ref 80.0–100.0)
Monocytes Absolute: 0.9 10*3/uL (ref 0.1–1.0)
Monocytes Relative: 10 %
Neutro Abs: 6.5 10*3/uL (ref 1.7–7.7)
Neutrophils Relative %: 73 %
Platelets: 249 10*3/uL (ref 150–400)
RBC: 5.62 MIL/uL (ref 4.22–5.81)
RDW: 13.1 % (ref 11.5–15.5)
WBC: 9 10*3/uL (ref 4.0–10.5)
nRBC: 0 % (ref 0.0–0.2)

## 2019-04-17 LAB — URINALYSIS, ROUTINE W REFLEX MICROSCOPIC
Bilirubin Urine: NEGATIVE
Glucose, UA: NEGATIVE mg/dL
Hgb urine dipstick: NEGATIVE
Ketones, ur: 20 mg/dL — AB
Leukocytes,Ua: NEGATIVE
Nitrite: NEGATIVE
Protein, ur: NEGATIVE mg/dL
Specific Gravity, Urine: >1.046 — ABNORMAL HIGH (ref 1.005–1.030)
pH: 6 (ref 5.0–8.0)

## 2019-04-17 LAB — LACTIC ACID, PLASMA: Lactic Acid, Venous: 0.9 mmol/L (ref 0.5–1.9)

## 2019-04-17 LAB — COMPREHENSIVE METABOLIC PANEL
ALT: 27 U/L (ref 0–44)
AST: 25 U/L (ref 15–41)
Albumin: 4.6 g/dL (ref 3.5–5.0)
Alkaline Phosphatase: 79 U/L (ref 38–126)
Anion gap: 13 (ref 5–15)
BUN: 18 mg/dL (ref 6–20)
CO2: 21 mmol/L — ABNORMAL LOW (ref 22–32)
Calcium: 9.5 mg/dL (ref 8.9–10.3)
Chloride: 106 mmol/L (ref 98–111)
Creatinine, Ser: 1.09 mg/dL (ref 0.61–1.24)
GFR calc Af Amer: >60 mL/min (ref >60)
GFR calc non Af Amer: >60 mL/min (ref >60)
Glucose, Bld: 110 mg/dL — ABNORMAL HIGH (ref 70–99)
Potassium: 3.6 mmol/L (ref 3.5–5.1)
Sodium: 140 mmol/L (ref 135–145)
Total Bilirubin: 1.7 mg/dL — ABNORMAL HIGH (ref 0.3–1.2)
Total Protein: 8.8 g/dL — ABNORMAL HIGH (ref 6.5–8.1)

## 2019-04-17 LAB — SARS CORONAVIRUS 2 BY RT PCR (HOSPITAL ORDER, PERFORMED IN ~~LOC~~ HOSPITAL LAB): SARS Coronavirus 2: NEGATIVE

## 2019-04-17 LAB — LIPASE, BLOOD: Lipase: 21 U/L (ref 11–51)

## 2019-04-17 MED ORDER — KETOROLAC TROMETHAMINE 30 MG/ML IJ SOLN: 30.0000 mg | Freq: Four times a day (QID) | INTRAMUSCULAR | Status: DC | PRN

## 2019-04-17 MED ORDER — GABAPENTIN 300 MG PO CAPS
300.0000 mg | ORAL_CAPSULE | Freq: Three times a day (TID) | ORAL | Status: DC
  Administered 2019-04-17 – 2019-04-18 (×3): 300 mg via ORAL
  Filled 2019-04-17 (×3): qty 1

## 2019-04-17 MED ORDER — HYDROMORPHONE HCL 1 MG/ML IJ SOLN
1.0000 mg | Freq: Once | INTRAMUSCULAR | Status: AC
  Administered 2019-04-17: 1 mg via INTRAVENOUS
  Filled 2019-04-17: qty 1

## 2019-04-17 MED ORDER — IOHEXOL 240 MG/ML SOLN
25.0000 mL | INTRAMUSCULAR | Status: AC
  Administered 2019-04-17: 25 mL via ORAL

## 2019-04-17 MED ORDER — SODIUM CHLORIDE 0.9 % IV BOLUS
1000.0000 mL | Freq: Once | INTRAVENOUS | Status: AC
  Administered 2019-04-17: 1000 mL via INTRAVENOUS

## 2019-04-17 MED ORDER — KETOROLAC TROMETHAMINE 30 MG/ML IJ SOLN
30.0000 mg | Freq: Four times a day (QID) | INTRAMUSCULAR | Status: DC
  Administered 2019-04-17 – 2019-04-18 (×4): 30 mg via INTRAVENOUS
  Filled 2019-04-17 (×5): qty 1

## 2019-04-17 MED ORDER — CYCLOBENZAPRINE HCL 10 MG PO TABS
5.0000 mg | ORAL_TABLET | Freq: Three times a day (TID) | ORAL | Status: DC
  Administered 2019-04-17 – 2019-04-18 (×3): 5 mg via ORAL
  Filled 2019-04-17 (×3): qty 1

## 2019-04-17 MED ORDER — PANTOPRAZOLE SODIUM 40 MG PO TBEC
40.0000 mg | DELAYED_RELEASE_TABLET | Freq: Every day | ORAL | Status: DC
  Administered 2019-04-17 – 2019-04-18 (×2): 40 mg via ORAL
  Filled 2019-04-17 (×2): qty 1

## 2019-04-17 MED ORDER — ONDANSETRON HCL 4 MG/2ML IJ SOLN
4.0000 mg | Freq: Four times a day (QID) | INTRAMUSCULAR | Status: DC | PRN
  Administered 2019-04-17: 4 mg via INTRAVENOUS
  Filled 2019-04-17: qty 2

## 2019-04-17 MED ORDER — ONDANSETRON 4 MG PO TBDP: 4.0000 mg | ORAL_TABLET | Freq: Four times a day (QID) | ORAL | Status: DC | PRN

## 2019-04-17 MED ORDER — ONDANSETRON HCL 4 MG/2ML IJ SOLN
4.0000 mg | Freq: Once | INTRAMUSCULAR | Status: AC
  Administered 2019-04-17: 4 mg via INTRAVENOUS
  Filled 2019-04-17: qty 2

## 2019-04-17 MED ORDER — HYDROMORPHONE HCL 1 MG/ML IJ SOLN
1.0000 mg | Freq: Once | INTRAMUSCULAR | Status: AC | PRN
  Administered 2019-04-17: 1 mg via INTRAVENOUS
  Filled 2019-04-17: qty 1

## 2019-04-17 MED ORDER — ENOXAPARIN SODIUM 40 MG/0.4ML ~~LOC~~ SOLN
40.0000 mg | SUBCUTANEOUS | Status: DC
  Administered 2019-04-17: 40 mg via SUBCUTANEOUS
  Filled 2019-04-17: qty 0.4

## 2019-04-17 MED ORDER — OXYCODONE-ACETAMINOPHEN 5-325 MG PO TABS
1.0000 | ORAL_TABLET | ORAL | Status: DC | PRN
  Administered 2019-04-17 – 2019-04-18 (×3): 2 via ORAL
  Filled 2019-04-17 (×3): qty 2

## 2019-04-17 MED ORDER — LACTATED RINGERS IV SOLN
INTRAVENOUS | Status: DC
  Administered 2019-04-17 – 2019-04-18 (×2): via INTRAVENOUS

## 2019-04-17 MED ORDER — IOHEXOL 300 MG/ML  SOLN
100.0000 mL | Freq: Once | INTRAMUSCULAR | Status: AC | PRN
  Administered 2019-04-17: 100 mL via INTRAVENOUS

## 2019-04-17 MED ORDER — ALPRAZOLAM 0.5 MG PO TABS: 0.2500 mg | ORAL_TABLET | Freq: Three times a day (TID) | ORAL | Status: DC | PRN

## 2019-04-17 NOTE — ED Provider Notes (Signed)
[facility] Emergency Department Provider Note  ____________________________________________   First MD Initiated Contact with Patient 04/17/19 [phone]     (approximate)  I have reviewed the triage vital signs and the nursing notes.   HISTORY  Chief Complaint Post-op Problem    HPI Matthew Chaney is a 38 y.o. male who is status post a ventral hernia repair on 7/21 by Dr. Dahlia Byes who now presents with postop concerns.  Patient overnight developed chills, cough and has been unable to tolerate p.o. patient says his abdominal pain is severe, constant, non radiating, not better with taking additional Percocet. It  has been associated with chills, increasing pain with coughing, low-grade temperatures.  Denies any GU symptoms.  Denies any shortness of breath.      Past Medical History:  Diagnosis Date  . Anxiety   . Diverticulitis   . GERD (gastroesophageal reflux disease)     Patient Active Problem List   Diagnosis Date Noted  . Diverticula of colon 02/07/2017  . Pelvic abscess in male [facility]) 09/05/2016  . Diverticulosis large intestine w/o perforation or abscess w/o bleeding 08/15/2016  . Diverticulitis of intestine without perforation or abscess without bleeding   . Acute diverticulitis 05/10/2016  . Elevated blood-pressure reading without diagnosis of hypertension 12/27/2011  . Esophageal reflux 12/27/2011  . Lesion of earlobe 12/27/2011    Past Surgical History:  Procedure Laterality Date  . COLON RESECTION SIGMOID N/A 08/15/2016   Procedure: COLON RESECTION SIGMOID;  Surgeon: Florene Glen, MD;  Location: [facility];  Service: General;  Laterality: N/A;  . COLONOSCOPY WITH PROPOFOL N/A 07/13/2016   Procedure: COLONOSCOPY WITH PROPOFOL;  Surgeon: Lucilla Lame, MD;  Location: [place];  Service: Endoscopy;  Laterality: N/A;  . ILEOSTOMY CLOSURE N/A 02/07/2017   Procedure: ILEOSTOMY TAKEDOWN;  Surgeon: Florene Glen, MD;  Location: [facility];  Service: General;  Laterality: N/A;  . LAPAROTOMY N/A 09/06/2016   Procedure: DRAINAGE OF PELVIC ABSCESS, ILEOSTOMY;  Surgeon: Florene Glen, MD;  Location: [facility];  Service: General;  Laterality: N/A;  . NOSE SURGERY  2010  . XI ROBOTIC ASSISTED VENTRAL HERNIA N/A 04/15/2019   Procedure: XI ROBOTIC ASSISTED VENTRAL HERNIA;  Surgeon: Jules Husbands, MD;  Location: [facility];  Service: General;  Laterality: N/A;    Prior to Admission medications   Medication Sig Start Date End Date Taking? Authorizing Provider  cyclobenzaprine (FLEXERIL) 5 MG tablet Take 1 tablet (5 mg total) by mouth 3 (three) times daily. Patient not taking: Reported on 04/02/2019 03/19/19   Caroleen Hamman F, MD  esomeprazole (NEXIUM 24HR) 20 MG capsule Take 20 mg by mouth daily.     [provider]  fexofenadine (ALLEGRA) 180 MG tablet Take 180 mg by mouth daily.    [provider]  gabapentin (NEURONTIN) 300 MG capsule Take 1 capsule (300 mg total) by mouth 3 (three) times daily. Patient not taking: Reported on 04/02/2019 03/26/19   Caroleen Hamman F, MD  oxyCODONE-acetaminophen (PERCOCET) 5-325 MG tablet Take 1-2 tablets by mouth every 4 (four) hours as needed for severe pain. 04/15/19 04/14/20  Jules Husbands, MD    Allergies Other and Vicodin [hydrocodone-acetaminophen]  Family History  Problem Relation Age of Onset  . Diverticulitis Father   . Breast cancer Maternal Grandmother     Social History Social History   Tobacco Use  . Smoking status: Current Some Day Smoker    Packs/day: 0.25    Types: Cigars, Cigarettes  .  Smokeless tobacco: Never Used  . Tobacco comment: daily  Substance Use Topics  . Alcohol use: Yes    Comment: EVERY OTHER WEEKEND  . Drug use: No      Review of Systems Constitutional: No fever/chills Eyes: No visual changes. ENT: Positive sore throat Cardiovascular: Denies chest pain. Respiratory: Denies shortness of breath.  Positive cough Gastrointestinal:  Positive abdominal pain.  Positive nausea.  No diarrhea.  No constipation. Genitourinary: Negative for dysuria. Musculoskeletal: Negative for back pain. Skin: Negative for rash. Neurological: Negative for headaches, focal weakness or numbness. All other ROS negative ____________________________________________   PHYSICAL EXAM:  VITAL SIGNS: ED Triage Vitals  Enc Vitals Group     BP 04/17/19 0727 (!) 159/102     Pulse Rate 04/17/19 0727 (!) 128     Resp 04/17/19 0727 20     Temp 04/17/19 0727 99.2 F (37.3 C)     Temp Source 04/17/19 0727 Oral     SpO2 04/17/19 0727 96 %     Weight 04/17/19 0728 224 lb (101.6 kg)     Height 04/17/19 0728 5\' 9"  (1.753 m)     Head Circumference --      Peak Flow --      Pain Score 04/17/19 0728 8     Pain Loc --      Pain Edu? --      Excl. in GC? --     Constitutional: Alert and oriented. Well appearing and in no acute distress. Eyes: Conjunctivae are normal. EOMI. Head: Atraumatic. Nose: No congestion/rhinnorhea. Mouth/Throat: Mucous membranes are moist.  OP clear Neck: No stridor. Trachea Midline. FROM Cardiovascular: Tachycardic regular rhythm. Grossly normal heart sounds.  Good peripheral circulation. Respiratory: Normal respiratory effort.  No retractions. Lungs CTAB. Gastrointestinal: Soft but tender most notably in the left upper quadrant.  No distention. No abdominal bruits.  Well-healing surgical scars. Musculoskeletal: No lower extremity tenderness nor edema.  No joint effusions. Neurologic:  Normal speech and language. No gross focal neurologic deficits are appreciated.  Skin:  Skin is warm, dry and intact. No rash noted. Psychiatric: Mood and affect are normal. Speech and behavior are normal. GU: Deferred   ____________________________________________   LABS (all labs ordered are listed, but only abnormal results are displayed)  Labs Reviewed  COMPREHENSIVE METABOLIC PANEL - Abnormal; Notable for the following components:       Result Value   CO2 21 (*)    Glucose, Bld 110 (*)    Total Protein 8.8 (*)    Total Bilirubin 1.7 (*)    All other components within normal limits  SARS CORONAVIRUS 2 (HOSPITAL ORDER, PERFORMED IN [facility])  CULTURE, BLOOD (ROUTINE X 2)  CULTURE, BLOOD (ROUTINE X 2)  URINE CULTURE  CBC WITH DIFFERENTIAL/PLATELET  LIPASE, BLOOD  LACTIC ACID, PLASMA  URINALYSIS, ROUTINE W REFLEX MICROSCOPIC   ____________________________________________   ED ECG REPORT I, Concha SeMary E Hyrum Shaneyfelt, the attending physician, personally viewed and interpreted this ECG.  EKG sinus tachycardia rate of 106, no ST elevation, T wave inversion in lead III, normal intervals except for a QTC of 476.,  Normal axis.   ____________________________________________  RADIOLOGY Vela ProseI, Klein Willcox E Alverda Nazzaro, personally viewed and evaluated these images (plain radiographs) as part of my medical decision making, as well as reviewing the written report by the radiologist.  ED MD interpretation: Chest x-ray without consolidation.  Official radiology report(s): Dg Chest 1 View  Result Date: 04/17/2019 CLINICAL DATA:  Postop hernia repair, nausea, fever  EXAM: CHEST  1 VIEW COMPARISON:  09/10/2016 FINDINGS: The heart size and mediastinal contours are within normal limits. There is no acute appearing airspace opacity. Bandlike scarring of the perihilar right lung unchanged from prior examination the visualized skeletal structures are unremarkable. IMPRESSION: No acute abnormality of the lungs in AP portable projection Electronically Signed   By: Lauralyn PrimesAlex  Bibbey M.D.   On: 04/17/2019 08:24   Ct Abdomen Pelvis W Contrast  Result Date: 04/17/2019 CLINICAL DATA:  Abdominal distension, post hernia repair 2 days ago. Fever, abdominal pain. EXAM: CT ABDOMEN AND PELVIS WITH CONTRAST TECHNIQUE: Multidetector CT imaging of the abdomen and pelvis was performed using the standard protocol following bolus administration of intravenous contrast.  CONTRAST:  100mL OMNIPAQUE IOHEXOL 300 MG/ML  SOLN COMPARISON:  03/21/2019 FINDINGS: Lower chest: Small hiatal hernia.  No acute abnormality. Hepatobiliary: No focal hepatic abnormality. Gallbladder unremarkable. Pancreas: No focal abnormality or ductal dilatation. Spleen: No focal abnormality.  Normal size. Adrenals/Urinary Tract: No adrenal abnormality. No focal renal abnormality. No stones or hydronephrosis. Urinary bladder is unremarkable. Stomach/Bowel: There is gaseous distension of both large and small bowel loops. Several fluid-filled small bowel loops. Findings likely reflect postoperative ileus. Vascular/Lymphatic: No evidence of aneurysm or adenopathy. Reproductive: No visible focal abnormality. Other: Within the right lateral abdominal wall in the area of prior hernia sac contains small bowel, there are fluid collections containing gas. These appear rim enhancing. On the inferior-most fluid collections, there is what appears to be a hematocrit level. I favor this represents postoperative hematoma. However, some of these fluid collections appear tubular with some also containing gas, raising the possibility that these could be bowel loops. However, I do not see any definite bowel extending from intra-abdominal into this area. There does appear to continue to be a small hernia defect with fat passing through the fascia from intra-abdominal into this area. Musculoskeletal: No acute bony abnormality. IMPRESSION: Complex fluid collections within the right lateral abdominal wall in the area of prior hernia sac. I favor this represents a multiloculated postoperative hematoma. However, some of these do have a tubular shaped. I do not see any definite bowel loops passing into the area, so I doubt this is recurrent/residual hernia with small bowel, but it cannot be completely excluded given the gas and tubular appearance of some of the fluid collections. There does appear to be a continued small defect through the  fascia of with fat passing through the fascial plane. Electronically Signed   By: Charlett NoseKevin  Dover M.D.   On: 04/17/2019 10:15    ____________________________________________   PROCEDURES  Procedure(s) performed (including Critical Care):  Procedures   ____________________________________________   INITIAL IMPRESSION / ASSESSMENT AND PLAN / ED COURSE  Leilani AbleJoshua A Dise was evaluated in Emergency Department on 04/17/2019 for the symptoms described in the history of present illness. He was evaluated in the context of the global COVID-19 pandemic, which necessitated consideration that the patient might be at risk for infection with the SARS-CoV-2 virus that causes COVID-19. Institutional protocols and algorithms that pertain to the evaluation of patients at risk for COVID-19 are in a state of rapid change based on information released by regulatory bodies including the CDC and federal and state organizations. These policies and algorithms were followed during the patient's care in the ED.     Patient is a 38 year old who was recently postop from hernia repair.  Patient is tachycardic with some low-grade temps.  Will get blood cultures to evaluate for bacteremia.  Will get  chest x-ray to evaluate for pneumonia, UA to evaluate for UTI, CT scan to evaluate for postop complications such as abscess versus perforation versus SBO.  Patient denies any shortness of breath to suggest PE.  Will give IV pain medication and nausea medicine and fluid and continue to monitor patient closely.  We will also get coronavirus testing.   White count is normal which is reassuring.  Patient's lipase rules out pancreatitis.  LFTs are normal low suspicion for cholecystitis.  9:28 AM reevaluated patient.  Patient is feeling better after pain medication.  Pending the CT scan  CT scan normal post op changes per Dr. Everlene FarrierPabon.   12:42 PM discussed with Dr. Everlene FarrierPabon. Plan to admit for observation overnight.    ____________________________________________   FINAL CLINICAL IMPRESSION(S) / ED DIAGNOSES   Final diagnoses:  Post-operative pain      MEDICATIONS GIVEN DURING THIS VISIT:  Medications  iohexol (OMNIPAQUE) 240 MG/ML injection 25 mL (25 mLs Oral Contrast Given 04/17/19 0753)  sodium chloride 0.9 % bolus 1,000 mL (0 mLs Intravenous Stopped 04/17/19 0920)  HYDROmorphone (DILAUDID) injection 1 mg (1 mg Intravenous Given 04/17/19 0800)  ondansetron (ZOFRAN) injection 4 mg (4 mg Intravenous Given 04/17/19 0759)  HYDROmorphone (DILAUDID) injection 1 mg (1 mg Intravenous Given 04/17/19 1015)  iohexol (OMNIPAQUE) 300 MG/ML solution 100 mL (100 mLs Intravenous Contrast Given 04/17/19 0938)  sodium chloride 0.9 % bolus 1,000 mL (1,000 mLs Intravenous New Bag/Given 04/17/19 1126)     ED Discharge Orders    None       Note:  This document was prepared using Dragon voice recognition software and may include unintentional dictation errors.   Concha SeFunke, Meleny Tregoning E, MD 04/17/19 1242

## 2019-04-17 NOTE — H&P (Signed)
[facility] SURGICAL HISTORY & PHYSICAL  HISTORY OF PRESENT ILLNESS (HPI):  38 y.o. male presented to [facility] ED today for post-op concerns. Patient underwent ventral hernia repair from previous ileostomy site on 07/21 with Dr Everlene FarrierPabon. He reports that he was doing well following the surgery. However last night, he developed abdominal discomfort, chills, and nausea. He had decreased PO intake at that time. No complaints of emesis, chest pain, or SOB. Still passing flatus. He tried his percocet at home without improvement in his symptoms. He is very anxious today. Work up in the ED revealed expected post-surgical changes although he clinically appears to have likely post-surgical ileus.   General surgery is consulted by emergency medicine physician Dr Artis DelayMary Funke, MD for evaluation and management of post-op concerns.      PAST MEDICAL HISTORY (PMH):  Past Medical History:  Diagnosis Date  . Anxiety   . Diverticulitis   . GERD (gastroesophageal reflux disease)     Reviewed. Otherwise negative.   PAST SURGICAL HISTORY (PSH):  Past Surgical History:  Procedure Laterality Date  . COLON RESECTION SIGMOID N/A 08/15/2016   Procedure: COLON RESECTION SIGMOID;  Surgeon: [name] E Cooper, MD;  Location: [facility] ORS;  Service: General;  Laterality: N/A;  . COLONOSCOPY WITH PROPOFOL N/A 07/13/2016   Procedure: COLONOSCOPY WITH PROPOFOL;  Surgeon: Midge Miniumarren Wohl, MD;  Location[facility];  Service: Endoscopy;  Laterality: N/A;  . ILEOSTOMY CLOSURE N/A 02/07/2017   Procedure: ILEOSTOMY TAKEDOWN;  Surgeon: Lattie Hawooper, Richard E, MD;  Location: [facility] ORS;  Service: General;  Laterality: N/A;  . LAPAROTOMY N/A 09/06/2016   Procedure: DRAINAGE OF PELVIC ABSCESS, ILEOSTOMY;  Surgeon: [name] E Cooper, MD;  Location: [facility] ORS;  Service: General;  Laterality: N/A;  . NOSE SURGERY  2010  . XI ROBOTIC ASSISTED VENTRAL HERNIA N/A 04/15/2019   Procedure: XI ROBOTIC ASSISTED VENTRAL HERNIA;  Surgeon:  Leafy RoPabon, Diego F, MD;  Location: [facility] ORS;  Service: General;  Laterality: N/A;    Reviewed. Otherwise negative.   MEDICATIONS:  Prior to Admission medications   Medication Sig Start Date End Date Taking? Authorizing Provider  cyclobenzaprine (FLEXERIL) 5 MG tablet Take 1 tablet (5 mg total) by mouth 3 (three) times daily. Patient not taking: Reported on 04/02/2019 03/19/19   Sterling BigPabon, Diego F, MD  esomeprazole (NEXIUM 24HR) 20 MG capsule Take 20 mg by mouth daily.     [provider]  fexofenadine (ALLEGRA) 180 MG tablet Take 180 mg by mouth daily.    [provider]  gabapentin (NEURONTIN) 300 MG capsule Take 1 capsule (300 mg total) by mouth 3 (three) times daily. Patient not taking: Reported on 04/02/2019 03/26/19   Sterling BigPabon, Diego F, MD  oxyCODONE-acetaminophen (PERCOCET) 5-325 MG tablet Take 1-2 tablets by mouth every 4 (four) hours as needed for severe pain. 04/15/19 04/14/20  Leafy RoPabon, Diego F, MD     ALLERGIES:  Allergies  Allergen Reactions  . Other Other (See Comments)    Seasonal allergies:  Sneezing, watery eyes, itchy throat.  . Vicodin [Hydrocodone-Acetaminophen] Hives and Rash     SOCIAL HISTORY:  Social History   Socioeconomic History  . Marital status: Married    Spouse name: Not on file  . Number of children: Not on file  . Years of education: Not on file  . Highest education level: Not on file  Occupational History  . Not on file  Social Needs  . Financial resource strain: Not on file  . Food insecurity  Worry: Not on file    Inability: Not on file  . Transportation needs    Medical: Not on file    Non-medical: Not on file  Tobacco Use  . Smoking status: Current Some Day Smoker    Packs/day: 0.25    Types: Cigars, Cigarettes  . Smokeless tobacco: Never Used  . Tobacco comment: daily  Substance and Sexual Activity  . Alcohol use: Yes    Comment: EVERY OTHER WEEKEND  . Drug use: No  . Sexual activity: Not on file  Lifestyle  . Physical activity     Days per week: Not on file    Minutes per session: Not on file  . Stress: Not on file  Relationships  . Social Herbalist on phone: Not on file    Gets together: Not on file    Attends religious service: Not on file    Active member of club or organization: Not on file    Attends meetings of clubs or organizations: Not on file    Relationship status: Not on file  . Intimate partner violence    Fear of current or ex partner: Not on file    Emotionally abused: Not on file    Physically abused: Not on file    Forced sexual activity: Not on file  Other Topics Concern  . Not on file  Social History Narrative  . Not on file     FAMILY HISTORY:  Family History  Problem Relation Age of Onset  . Diverticulitis Father   . Breast cancer Maternal Grandmother     Otherwise negative.   REVIEW OF SYSTEMS:  Review of Systems  Constitutional: Positive for chills. Negative for fever, malaise/fatigue and weight loss.       + Decreased PO intake  HENT: Negative for congestion and sore throat.   Respiratory: Negative for cough and shortness of breath.   Cardiovascular: Negative for chest pain and palpitations.  Gastrointestinal: Positive for abdominal pain and nausea. Negative for blood in stool, constipation, diarrhea and vomiting.  Genitourinary: Negative for dysuria and urgency.  Neurological: Negative for dizziness and headaches.  All other systems reviewed and are negative.   VITAL SIGNS:  Temp:  [99.2 F (37.3 C)] 99.2 F (37.3 C) (07/23 0727) Pulse Rate:  [109-128] 110 (07/23 1100) Resp:  [19-32] 19 (07/23 1100) BP: (117-159)/(92-111) 151/106 (07/23 1100) SpO2:  [90 %-97 %] 97 % (07/23 1100) Weight:  [101.6 kg] 101.6 kg (07/23 0728)     Height: 5\' 9"  (175.3 cm) Weight: 101.6 kg BMI (Calculated): 33.06   PHYSICAL EXAM:  Physical Exam Vitals signs and nursing note reviewed.  Constitutional:      General: He is not in acute distress.    Appearance: Normal  appearance. He is obese. He is not ill-appearing.  HENT:     Head: Normocephalic and atraumatic.  Eyes:     General: No scleral icterus.    Conjunctiva/sclera: Conjunctivae normal.  Cardiovascular:     Rate and Rhythm: Normal rate and regular rhythm.     Pulses: Normal pulses.     Heart sounds: No murmur.  Pulmonary:     Effort: Pulmonary effort is normal. No respiratory distress.     Breath sounds: Normal breath sounds. No wheezing.  Abdominal:     General: Abdomen is protuberant. A surgical scar is present. There is distension (mild).     Palpations: Abdomen is soft.     Tenderness: There is no abdominal tenderness.  There is no guarding or rebound.     Hernia: No hernia is present.     Comments: Well healed laparotomy and previous ileostomy site, no recurrent hernia, he is mildly distended  Genitourinary:    Comments: deferred Musculoskeletal: Normal range of motion.        General: No swelling.     Right lower leg: No edema.     Left lower leg: No edema.  Skin:    General: Skin is warm and dry.     Coloration: Skin is not pale.     Findings: No erythema.  Neurological:     General: No focal deficit present.     Mental Status: He is alert. He is disoriented.  Psychiatric:        Mood and Affect: Mood is anxious.        Behavior: Behavior normal.     INTAKE/OUTPUT:  This shift: Total I/O In: 1000 [IV Piggyback:1000] Out: -   Last 2 shifts: @IOLAST2SHIFTS @  Labs:  CBC Latest Ref Rng & Units 04/17/2019 03/19/2019 02/10/2017  WBC 4.0 - 10.5 K/uL 9.0 4.8 7.3  Hemoglobin 13.0 - 17.0 g/dL 82.915.8 56.215.2 12.4(L)  Hematocrit 39.0 - 52.0 % 47.3 46.2 35.5(L)  Platelets 150 - 400 K/uL 249 190 205   CMP Latest Ref Rng & Units 04/17/2019 03/19/2019 02/08/2017  Glucose 70 - 99 mg/dL 130(Q110(H) 98 657(Q105(H)  BUN 6 - 20 mg/dL 18 46(N21(H) 12  Creatinine 0.61 - 1.24 mg/dL 6.291.09 5.281.12 4.131.00  Sodium 135 - 145 mmol/L 140 139 138  Potassium 3.5 - 5.1 mmol/L 3.6 4.3 3.3(L)  Chloride 98 - 111 mmol/L 106  108 108  CO2 22 - 32 mmol/L 21(L) 23 23  Calcium 8.9 - 10.3 mg/dL 9.5 2.4(M8.8(L) 8.1(L)  Total Protein 6.5 - 8.1 g/dL 0.1(U8.8(H) 8.3(H) -  Total Bilirubin 0.3 - 1.2 mg/dL 2.7(O1.7(H) 0.5 -  Alkaline Phos 38 - 126 U/L 79 79 -  AST 15 - 41 U/L 25 33 -  ALT 0 - 44 U/L 27 32 -     Imaging studies:   CT Abdomen/Pelvis personally reviewed with Dr Everlene FarrierPabon which shows expected post-surgical changes, radiologist report reviewed: IMPRESSION: Complex fluid collections within the right lateral abdominal wall in the area of prior hernia sac. I favor this represents a multiloculated postoperative hematoma. However, some of these do have a tubular shaped. I do not see any definite bowel loops passing into the area, so I doubt this is recurrent/residual hernia with small bowel, but it cannot be completely excluded given the gas and tubular appearance of some of the fluid collections. There does appear to be a continued small defect through the fascia of with fat passing through the fascial plane.   Assessment/Plan:  38 y.o. male with what clinically appears to be post-surgical ileus   - Admit to general surgery for observation  - clear liquid diet as tolerates  - IVF  - pain control; anti-emetics prn  - monitor abdominal examination; on-going bowel function  - Will repeat KUB in the AM  - mobilization encouraged  - medical management of comorbidities   - anxiolytic ordered given anxiety   - DVT prophylaxis  All of the above findings and recommendations were discussed with the patient, and all of his questions were answered to his expressed satisfaction.  -- Lynden OxfordZachary Gianah Batt, PA-C [facility] 04/17/2019, 12:22 PM [phone] M-F: 7am - 4pm

## 2019-04-17 NOTE — ED Notes (Signed)
Pt ambulatory to toilet in room with steady gait at this time

## 2019-04-17 NOTE — ED Notes (Signed)
ED TO INPATIENT HANDOFF REPORT  ED Nurse Name and Phone #: Darion Juhasz [phone]  S Name/Age/Gender Matthew Chaney 38 y.o. male Room/Bed: ED26A/ED26A  Code Status   Code Status: Full Code  Home/SNF/Other home {Patient oriented x 4 Is this baseline?   Triage Complete: Triage complete  Chief Complaint post op  Triage Note Pt presents to ED via POv with c/o post op problem, states had hernia repair on Tuesday, last night developed chills, cough, and has been unable to tolerate PO intake since then. Pt presents with low grade temp and tachycardia at this time.    Allergies Allergies  Allergen Reactions  . Other Other (See Comments)    Seasonal allergies:  Sneezing, watery eyes, itchy throat.  . Vicodin [Hydrocodone-Acetaminophen] Hives and Rash    Level of Care/Admitting Diagnosis ED Disposition    ED Disposition Condition Comment   Admit  Hospital Area: [facility] [[id number]]  Level of Care: Med-Surg [16]  Covid Evaluation: Asymptomatic Screening Protocol (No Symptoms)  Diagnosis: Ileus [facility](HCC) [[id number]]) [[id number]]  Admitting Physician: Leafy RoPABON, DIEGO F [[id number]]  Attending Physician: Leafy RoPABON, DIEGO F [[id number]]  PT Class (Do Not Modify): Observation [104]  PT Acc Code (Do Not Modify): Observation [10022]       B Medical/Surgery History Past Medical History:  Diagnosis Date  . Anxiety   . Diverticulitis   . GERD (gastroesophageal reflux disease)    Past Surgical History:  Procedure Laterality Date  . COLON RESECTION SIGMOID N/A 08/15/2016   Procedure: COLON RESECTION SIGMOID;  Surgeon: [name] E Cooper, MD;  Location: [facility] ORS;  Service: General;  Laterality: N/A;  . COLONOSCOPY WITH PROPOFOL N/A 07/13/2016   Procedure: COLONOSCOPY WITH PROPOFOL;  Surgeon: Midge Miniumarren Wohl, MD;  [facility];  Service: Endoscopy;  Laterality: N/A;  . ILEOSTOMY CLOSURE N/A 02/07/2017   Procedure: ILEOSTOMY TAKEDOWN;  Surgeon: Lattie Hawooper, Richard E, MD;  Location: [facility] OR[facility];   Service: General;  Laterality: N/A;  . LAPAROTOMY N/A 09/06/2016   Procedure: DRAINAGE OF PELVIC ABSCESS, ILEOSTOMY;  Surgeon: [name] E Cooper, MD;  Location: [facility] OR[facility];  Service: General;  Laterality: N/A;  . NOSE SURGERY  2010  . XI ROBOTIC ASSISTED VENTRAL HERNIA N/A 04/15/2019   Procedure: XI ROBOTIC ASSISTED VENTRAL HERNIA;  Surgeon: Leafy RoPabon, Diego F, MD;  Location: [facility] ORS;  Service: General;  Laterality: N/A;     A IV Location/Drains/Wounds Patient Lines/Drains/Airways Status   Active Line/Drains/Airways    Name:   Placement date:   Placement time:   Site:   Days:   Peripheral IV 04/17/19 Left Forearm   04/17/19    0758    Forearm   less than 1   Open Drain 1 Left Abdomen    02/07/17    1325    Abdomen   799   Incision (Closed) 02/07/17 Abdomen   02/07/17    1326     799   Incision - 4 Ports Abdomen Left;Upper;Lateral Left;Mid;Lateral Left;Lower;Lateral Lower;Mid   04/15/19    0830     2          Intake/Output Last 24 hours  Intake/Output Summary (Last 24 hours) at 04/17/2019 1325 Last data filed at 04/17/2019 0920 Gross per 24 hour  Intake 1000 ml  Output -  Net 1000 ml    Labs/Imaging Results for orders placed or performed during the hospital encounter of 04/17/19 (from the past 48 hour(s))  CBC with Differential     Status: None   Collection Time: 04/17/19  7:47 AM  Result Value Ref Range   WBC 9.0 4.0 - 10.5 K/uL   RBC 5.62 4.22 - 5.81 MIL/uL   Hemoglobin 15.8 13.0 - 17.0 g/dL   HCT 47.3 39.0 - 52.0 %   MCV 84.2 80.0 - 100.0 fL   MCH 28.1 26.0 - 34.0 pg   MCHC 33.4 30.0 - 36.0 g/dL   RDW 13.1 11.5 - 15.5 %   Platelets 249 150 - 400 K/uL   nRBC 0.0 0.0 - 0.2 %   Neutrophils Relative % 73 %   Neutro Abs 6.5 1.7 - 7.7 K/uL   Lymphocytes Relative 17 %   Lymphs Abs 1.6 0.7 - 4.0 K/uL   Monocytes Relative 10 %   Monocytes Absolute 0.9 0.1 - 1.0 K/uL   Eosinophils Relative 0 %   Eosinophils Absolute 0.0 0.0 - 0.5 K/uL   Basophils Relative 0 %   Basophils  Absolute 0.0 0.0 - 0.1 K/uL   Immature Granulocytes 0 %   Abs Immature Granulocytes 0.01 0.00 - 0.07 K/uL    Comment: Performed at [facility], [address] [place], [place] [address]  Comprehensive metabolic panel     Status: Abnormal   Collection Time: 04/17/19  7:47 AM  Result Value Ref Range   Sodium 140 135 - 145 mmol/L   Potassium 3.6 3.5 - 5.1 mmol/L   Chloride 106 98 - 111 mmol/L   CO2 21 (L) 22 - 32 mmol/L   Glucose, Bld 110 (H) 70 - 99 mg/dL   BUN 18 6 - 20 mg/dL   Creatinine, Ser 1.09 0.61 - 1.24 mg/dL   Calcium 9.5 8.9 - 10.3 mg/dL   Total Protein 8.8 (H) 6.5 - 8.1 g/dL   Albumin 4.6 3.5 - 5.0 g/dL   AST 25 15 - 41 U/L   ALT 27 0 - 44 U/L   Alkaline Phosphatase 79 38 - 126 U/L   Total Bilirubin 1.7 (H) 0.3 - 1.2 mg/dL   GFR calc non Af Amer >60 >60 mL/min   GFR calc Af Amer >60 >60 mL/min   Anion gap 13 5 - 15    Comment: Performed at [facility], [address] [place], [place] [address]  Lipase, blood     Status: None   Collection Time: 04/17/19  7:47 AM  Result Value Ref Range   Lipase 21 11 - 51 U/L    Comment: Performed at [facility], [address] [place], [place] [address]  Lactic acid, plasma     Status: None   Collection Time: 04/17/19  7:47 AM  Result Value Ref Range   Lactic Acid, Venous 0.9 0.5 - 1.9 mmol/L    Comment: Performed at [facility], [address] [place], [place] [address]  SARS Coronavirus 2 (CEPHEID - Performed in [place] hospital lab), Hosp Order     Status: None   Collection Time: 04/17/19  8:08 AM   Specimen: Nasopharyngeal Swab  Result Value Ref Range   SARS Coronavirus 2 NEGATIVE NEGATIVE    Comment: (NOTE) If result is NEGATIVE SARS-CoV-2 target nucleic acids are NOT DETECTED. The SARS-CoV-2 RNA is generally detectable in upper and lower  respiratory specimens during the acute phase of infection. The lowest  concentration of SARS-CoV-2 viral copies this assay can detect is  250  copies / mL. A negative result does not preclude SARS-CoV-2 infection  and should not be used as the sole basis for treatment or other  patient management decisions.  A negative result may occur with  improper specimen collection / handling, submission of specimen other  than nasopharyngeal swab, presence of viral mutation(s) within the  areas targeted by this assay, and inadequate number of viral copies  (<250 copies / mL). A negative result must be combined with clinical  observations, patient history, and epidemiological information. If result is POSITIVE SARS-CoV-2 target nucleic acids are DETECTED. The SARS-CoV-2 RNA is generally detectable in upper and lower  respiratory specimens dur ing the acute phase of infection.  Positive  results are indicative of active infection with SARS-CoV-2.  Clinical  correlation with patient history and other diagnostic information is  necessary to determine patient infection status.  Positive results do  not rule out bacterial infection or co-infection with other viruses. If result is PRESUMPTIVE POSTIVE SARS-CoV-2 nucleic acids MAY BE PRESENT.   A presumptive positive result was obtained on the submitted specimen  and confirmed on repeat testing.  While 2019 novel coronavirus  (SARS-CoV-2) nucleic acids may be present in the submitted sample  additional confirmatory testing may be necessary for epidemiological  and / or clinical management purposes  to differentiate between  SARS-CoV-2 and other Sarbecovirus currently known to infect humans.  If clinically indicated additional testing with an alternate test  methodology [phone]) is advised. The SARS-CoV-2 RNA is generally  detectable in upper and lower respiratory sp ecimens during the acute  phase of infection. The expected result is Negative. Fact Sheet for Patients:  BoilerBrush.com.cy Fact Sheet for Healthcare  Providers: https://pope.com/ This test is not yet approved or cleared by the [place] FDA and has been authorized for detection and/or diagnosis of SARS-CoV-2 by FDA under an Emergency Use Authorization (EUA).  This EUA will remain in effect (meaning this test can be used) for the duration of the COVID-19 declaration under Section 564(b)(1) of the Act, 21 U.S.C. section 360bbb-3(b)(1), unless the authorization is terminated or revoked sooner. Performed at [facility], [address] [place], [place] [address]   Urinalysis, Routine w reflex microscopic     Status: Abnormal   Collection Time: 04/17/19  1:04 PM  Result Value Ref Range   Color, Urine YELLOW (A) YELLOW   APPearance CLEAR (A) CLEAR   Specific Gravity, Urine >1.046 (H) 1.005 - 1.030   pH 6.0 5.0 - 8.0   Glucose, UA NEGATIVE NEGATIVE mg/dL   Hgb urine dipstick NEGATIVE NEGATIVE   Bilirubin Urine NEGATIVE NEGATIVE   Ketones, ur 20 (A) NEGATIVE mg/dL   Protein, ur NEGATIVE NEGATIVE mg/dL   Nitrite NEGATIVE NEGATIVE   Leukocytes,Ua NEGATIVE NEGATIVE    Comment: Performed at [facility], [address] [place], [place] [address]   Dg Chest 1 View  Result Date: 04/17/2019 CLINICAL DATA:  Postop hernia repair, nausea, fever EXAM: CHEST  1 VIEW COMPARISON:  09/10/2016 FINDINGS: The heart size and mediastinal contours are within normal limits. There is no acute appearing airspace opacity. Bandlike scarring of the perihilar right lung unchanged from prior examination the visualized skeletal structures are unremarkable. IMPRESSION: No acute abnormality of the lungs in AP portable projection Electronically Signed   By: Lauralyn Primes M.D.   On: 04/17/2019 08:24   Ct Abdomen Pelvis W Contrast  Result Date: 04/17/2019 CLINICAL DATA:  Abdominal distension, post hernia repair 2 days ago. Fever, abdominal pain. EXAM: CT ABDOMEN AND PELVIS WITH CONTRAST TECHNIQUE: Multidetector CT imaging  of the abdomen and pelvis was performed using the standard protocol following bolus  administration of intravenous contrast. CONTRAST:  100mL OMNIPAQUE IOHEXOL 300 MG/ML  SOLN COMPARISON:  03/21/2019 FINDINGS: Lower chest: Small hiatal hernia.  No acute abnormality. Hepatobiliary: No focal hepatic abnormality. Gallbladder unremarkable. Pancreas: No focal abnormality or ductal dilatation. Spleen: No focal abnormality.  Normal size. Adrenals/Urinary Tract: No adrenal abnormality. No focal renal abnormality. No stones or hydronephrosis. Urinary bladder is unremarkable. Stomach/Bowel: There is gaseous distension of both large and small bowel loops. Several fluid-filled small bowel loops. Findings likely reflect postoperative ileus. Vascular/Lymphatic: No evidence of aneurysm or adenopathy. Reproductive: No visible focal abnormality. Other: Within the right lateral abdominal wall in the area of prior hernia sac contains small bowel, there are fluid collections containing gas. These appear rim enhancing. On the inferior-most fluid collections, there is what appears to be a hematocrit level. I favor this represents postoperative hematoma. However, some of these fluid collections appear tubular with some also containing gas, raising the possibility that these could be bowel loops. However, I do not see any definite bowel extending from intra-abdominal into this area. There does appear to continue to be a small hernia defect with fat passing through the fascia from intra-abdominal into this area. Musculoskeletal: No acute bony abnormality. IMPRESSION: Complex fluid collections within the right lateral abdominal wall in the area of prior hernia sac. I favor this represents a multiloculated postoperative hematoma. However, some of these do have a tubular shaped. I do not see any definite bowel loops passing into the area, so I doubt this is recurrent/residual hernia with small bowel, but it cannot be completely excluded given  the gas and tubular appearance of some of the fluid collections. There does appear to be a continued small defect through the fascia of with fat passing through the fascial plane. Electronically Signed   By: Charlett NoseKevin  Dover M.D.   On: 04/17/2019 10:15    Pending Labs Unresulted Labs (From admission, onward)    Start     Ordered   04/24/19 0500  Creatinine, serum  (enoxaparin (LOVENOX)    CrCl >/= 30 ml/min)  Weekly,   STAT    Comments: while on enoxaparin therapy    04/17/19 1254   04/18/19 0500  Basic metabolic panel  Tomorrow morning,   STAT     04/17/19 1254   04/18/19 0500  CBC  Tomorrow morning,   STAT     04/17/19 1254   04/18/19 0500  Magnesium  Tomorrow morning,   STAT     04/17/19 1254   04/18/19 0500  Phosphorus  Tomorrow morning,   STAT     04/17/19 1254   04/17/19 1255  HIV antibody (Routine Testing)  Once,   STAT     04/17/19 1254   04/17/19 1255  CBC  (enoxaparin (LOVENOX)    CrCl >/= 30 ml/min)  Once,   STAT    Comments: Baseline for enoxaparin therapy IF NOT ALREADY DRAWN.  Notify MD if PLT < 100 K.    04/17/19 1254   04/17/19 1255  Creatinine, serum  (enoxaparin (LOVENOX)    CrCl >/= 30 ml/min)  Once,   STAT    Comments: Baseline for enoxaparin therapy IF NOT ALREADY DRAWN.    04/17/19 1254   04/17/19 0739  Blood culture (routine x 2)  BLOOD CULTURE X 2,   STAT     04/17/19 0739   04/17/19 0739  Urine culture  ONCE - STAT,   STAT     04/17/19 [id number]  Vitals/Pain Today's Vitals   04/17/19 1126 04/17/19 1200 04/17/19 1323 04/17/19 1325  BP:  (!) 147/108 (!) 146/107   Pulse:  (!) 102 (!) 112   Resp:  (!) 21    Temp:      TempSrc:      SpO2:  (!) 89% 96%   Weight:      Height:      PainSc: 5    7     Isolation Precautions No active isolations  Medications Medications  iohexol (OMNIPAQUE) 240 MG/ML injection 25 mL (25 mLs Oral Contrast Given 04/17/19 0753)  oxyCODONE-acetaminophen (PERCOCET/ROXICET) 5-325 MG per tablet 1-2 tablet (has no  administration in time range)  pantoprazole (PROTONIX) EC tablet 40 mg (has no administration in time range)  cyclobenzaprine (FLEXERIL) tablet 5 mg (has no administration in time range)  gabapentin (NEURONTIN) capsule 300 mg (has no administration in time range)  enoxaparin (LOVENOX) injection 40 mg (has no administration in time range)  lactated ringers infusion (has no administration in time range)  ketorolac (TORADOL) 30 MG/ML injection 30 mg (has no administration in time range)    Followed by  ketorolac (TORADOL) 30 MG/ML injection 30 mg (has no administration in time range)  ondansetron (ZOFRAN-ODT) disintegrating tablet 4 mg (has no administration in time range)    Or  ondansetron (ZOFRAN) injection 4 mg (has no administration in time range)  ALPRAZolam (XANAX) tablet 0.25 mg (has no administration in time range)  sodium chloride 0.9 % bolus 1,000 mL (0 mLs Intravenous Stopped 04/17/19 0920)  HYDROmorphone (DILAUDID) injection 1 mg (1 mg Intravenous Given 04/17/19 0800)  ondansetron (ZOFRAN) injection 4 mg (4 mg Intravenous Given 04/17/19 0759)  HYDROmorphone (DILAUDID) injection 1 mg (1 mg Intravenous Given 04/17/19 1015)  iohexol (OMNIPAQUE) 300 MG/ML solution 100 mL (100 mLs Intravenous Contrast Given 04/17/19 0938)  sodium chloride 0.9 % bolus 1,000 mL (1,000 mLs Intravenous New Bag/Given 04/17/19 1126)    Mobility Low fall risk   Focused Assessments   R Recommendations: See Admitting Provider Note  Report given to:   Additional Notes:

## 2019-04-17 NOTE — ED Triage Notes (Signed)
Pt presents to ED via POv with c/o post op problem, states had hernia repair on Tuesday, last night developed chills, cough, and has been unable to tolerate PO intake since then. Pt presents with low grade temp and tachycardia at this time.

## 2019-04-17 NOTE — ED Notes (Signed)
Nurse spoke with Stanton Kidney pt's wife.

## 2019-04-18 ENCOUNTER — Observation Stay: Payer: Medicaid Other

## 2019-04-18 DIAGNOSIS — K567 Ileus, unspecified: Secondary | ICD-10-CM

## 2019-04-18 LAB — BASIC METABOLIC PANEL
Anion gap: 9 (ref 5–15)
BUN: 20 mg/dL (ref 6–20)
CO2: 25 mmol/L (ref 22–32)
Calcium: 8.5 mg/dL — ABNORMAL LOW (ref 8.9–10.3)
Chloride: 107 mmol/L (ref 98–111)
Creatinine, Ser: 1.13 mg/dL (ref 0.61–1.24)
GFR calc Af Amer: >60 mL/min (ref >60)
GFR calc non Af Amer: >60 mL/min (ref >60)
Glucose, Bld: 89 mg/dL (ref 70–99)
Potassium: 3.5 mmol/L (ref 3.5–5.1)
Sodium: 141 mmol/L (ref 135–145)

## 2019-04-18 LAB — CBC
HCT: 42.5 % (ref 39.0–52.0)
Hemoglobin: 13.9 g/dL (ref 13.0–17.0)
MCH: 28.1 pg (ref 26.0–34.0)
MCHC: 32.7 g/dL (ref 30.0–36.0)
MCV: 85.9 fL (ref 80.0–100.0)
Platelets: 190 10*3/uL (ref 150–400)
RBC: 4.95 MIL/uL (ref 4.22–5.81)
RDW: 13.2 % (ref 11.5–15.5)
WBC: 5.9 10*3/uL (ref 4.0–10.5)
nRBC: 0 % (ref 0.0–0.2)

## 2019-04-18 LAB — MAGNESIUM: Magnesium: 2.5 mg/dL — ABNORMAL HIGH (ref 1.7–2.4)

## 2019-04-18 LAB — PHOSPHORUS: Phosphorus: 3.8 mg/dL (ref 2.5–4.6)

## 2019-04-18 MED ORDER — ONDANSETRON HCL 4 MG PO TABS: 4.0000 mg | ORAL_TABLET | Freq: Three times a day (TID) | ORAL | 0 refills | Status: DC | PRN

## 2019-04-18 NOTE — Care Management (Signed)
Patient admitted with post op ileus.  Patient is self pay.  RNCM following for medication needs.   This RNCM previously provided resources to Medication Management  [facility] in 2018.  Per chart review it does not appears that patient enrolled in these services

## 2019-04-18 NOTE — Discharge Summary (Signed)
  Patient ID: CRISTOPHER CICCARELLI MRN: [id number] DOB/AGE: 36-Aug-1982 36 y.o.  Admit date: 04/17/2019 Discharge date: 04/18/2019   Discharge Diagnoses:  Active Problems:   Ileus [facility])   Procedures:none  Hospital Course: 38 year old male admitted for ileus after a robotic complex ventral hernia repair.  CT scan rule out any evidence of abscess or bowel injury.  He did have nausea and vomiting he was kept n.p.o. and slowly his diet was advanced. He was passing gas and tolerated regular diet/  The time of discharge she was ambulating, tolerating regular diet.  His physical exam showed a male no acute distress.  Awake and alert.  His vital signs were stable he was afebrile.  Abdomen soft incisions healing well without evidence of infection no peritonitis.  Extremities well-perfused.  Condition of the patient at time of discharge was stable  Consults: none  Disposition:   Discharge Instructions    Call MD for:  difficulty breathing, headache or visual disturbances   Complete by: As directed    Call MD for:  extreme fatigue   Complete by: As directed    Call MD for:  hives   Complete by: As directed    Call MD for:  persistant dizziness or light-headedness   Complete by: As directed    Call MD for:  persistant nausea and vomiting   Complete by: As directed    Call MD for:  redness, tenderness, or signs of infection (pain, swelling, redness, odor or green/yellow discharge around incision site)   Complete by: As directed    Call MD for:  severe uncontrolled pain   Complete by: As directed    Call MD for:  temperature >100.4   Complete by: As directed    Diet - low sodium heart healthy   Complete by: As directed    Increase activity slowly   Complete by: As directed      Allergies as of 04/18/2019      Reactions   Other Other (See Comments)   Seasonal allergies:  Sneezing, watery eyes, itchy throat.   Vicodin [hydrocodone-acetaminophen] Hives, Rash      Medication List    TAKE  these medications   cyclobenzaprine 5 MG tablet Commonly known as: FLEXERIL Take 1 tablet (5 mg total) by mouth 3 (three) times daily.   fexofenadine 180 MG tablet Commonly known as: ALLEGRA Take 180 mg by mouth daily.   gabapentin 300 MG capsule Commonly known as: Neurontin Take 1 capsule (300 mg total) by mouth 3 (three) times daily.   NexIUM 24HR 20 MG capsule Generic drug: esomeprazole Take 20 mg by mouth daily.   ondansetron 4 MG tablet Commonly known as: Zofran Take 1 tablet (4 mg total) by mouth every 8 (eight) hours as needed for nausea or vomiting.   oxyCODONE-acetaminophen 5-325 MG tablet Commonly known as: Percocet Take 1-2 tablets by mouth every 4 (four) hours as needed for severe pain.         Caroleen Hamman, MD FACS

## 2019-04-19 LAB — URINE CULTURE: Culture: NO GROWTH

## 2019-04-19 LAB — HIV ANTIBODY (ROUTINE TESTING W REFLEX): HIV Screen 4th Generation wRfx: NONREACTIVE

## 2019-04-22 LAB — CULTURE, BLOOD (ROUTINE X 2)
Culture: NO GROWTH
Culture: NO GROWTH
Special Requests: ADEQUATE

## 2019-04-29 ENCOUNTER — Other Ambulatory Visit: Payer: Self-pay

## 2019-04-29 ENCOUNTER — Encounter: Payer: Self-pay | Admitting: Physician Assistant

## 2019-04-29 ENCOUNTER — Ambulatory Visit (INDEPENDENT_AMBULATORY_CARE_PROVIDER_SITE_OTHER): Payer: Self-pay | Admitting: Physician Assistant

## 2019-04-29 VITALS — BP 124/87 | HR 107 | Temp 96.2°F | Resp 16 | Ht 69.0 in | Wt 212.0 lb

## 2019-04-29 DIAGNOSIS — K439 Ventral hernia without obstruction or gangrene: Secondary | ICD-10-CM

## 2019-04-29 DIAGNOSIS — Z09 Encounter for follow-up examination after completed treatment for conditions other than malignant neoplasm: Secondary | ICD-10-CM

## 2019-04-29 NOTE — Patient Instructions (Signed)
GENERAL POST-OPERATIVE PATIENT INSTRUCTIONS   WOUND CARE INSTRUCTIONS:  Keep a dry clean dressing on the wound if there is drainage. The initial bandage may be removed after 24 hours.  Once the wound has quit draining you may leave it open to air.  If clothing rubs against the wound or causes irritation and the wound is not draining you may cover it with a dry dressing during the daytime.  Try to keep the wound dry and avoid ointments on the wound unless directed to do so.  If the wound becomes bright red and painful or starts to drain infected material that is not clear, please contact your physician immediately.  If the wound is mildly pink and has a thick firm ridge underneath it, this is normal, and is referred to as a healing ridge.  This will resolve over the next 4-6 weeks.  BATHING: You may shower if you have been informed of this by your surgeon. However, Please do not submerge in a tub, hot tub, or pool until incisions are completely sealed or have been told by your surgeon that you may do so.  DIET:  You may eat any foods that you can tolerate.  It is a good idea to eat a high fiber diet and take in plenty of fluids to prevent constipation.  If you do become constipated you may want to take a mild laxative or take ducolax tablets on a daily basis until your bowel habits are regular.  Constipation can be very uncomfortable, along with straining, after recent surgery.  ACTIVITY:  You are encouraged to cough and deep breath or use your incentive spirometer if you were given one, every 15-30 minutes when awake.  This will help prevent respiratory complications and low grade fevers post-operatively if you had a general anesthetic.  You may want to hug a pillow when coughing and sneezing to add additional support to the surgical area, if you had abdominal or chest surgery, which will decrease pain during these times.  You are encouraged to walk and engage in light activity for the next two weeks.  You  should not lift more than 20 pounds, until 05/27/2019 as it could put you at increased risk for complications.  Twenty pounds is roughly equivalent to a plastic bag of groceries. At that time- Listen to your body when lifting, if you have pain when lifting, stop and then try again in a few days. Soreness after doing exercises or activities of daily living is normal as you get back in to your normal routine.  MEDICATIONS:  Try to take narcotic medications and anti-inflammatory medications, such as tylenol, ibuprofen, naprosyn, etc., with food.  This will minimize stomach upset from the medication.  Should you develop nausea and vomiting from the pain medication, or develop a rash, please discontinue the medication and contact your physician.  You should not drive, make important decisions, or operate machinery when taking narcotic pain medication.  SUNBLOCK Use sun block to incision area over the next year if this area will be exposed to sun. This helps decrease scarring and will allow you avoid a permanent darkened area over your incision.  QUESTIONS:  Please feel free to call our office if you have any questions, and we will be glad to assist you. [phone]    

## 2019-04-29 NOTE — Progress Notes (Signed)
[facility] POST-OP OFFICE VISIT  04/29/2019  HPI: Matthew Chaney is a 38 y.o. male 2 weeks s/p robot assisted laparoscopic ventral hernia repair with Dr Dahlia Byes and readmission for post-op ileus.   Today, he reports that he is doing well. Biggest issue is an intermittent "burning and stinging" pain over his previous ileostomy site. This resolves with rest and is typically brought on when he over exerts himself. No fever, chills, nausea, or emesis. He is having normal bowel function. Otherwise doing well without issue.   Vital signs: BP 124/87   Pulse (!) 107   Temp (!) 96.2 F (35.7 C) (Temporal)   Resp 16   Ht 5\' 9"  (1.753 m)   Wt 212 lb (96.2 kg)   SpO2 98%   BMI 31.31 kg/m    Physical Exam: Constitutional: Well appearing male, NAD Abdomen: Soft, non-tender, non-distended, no hernia recurrence.  Skin: well healed laparoscopic incisions without erythema or drainage. Previous midline and ileostomy incisions are well healed.   Assessment/Plan: This is a 38 y.o. male 2 weeks s/p robot assisted laparoscopic ventral hernia repair.   - Offered flexeril vs gabapentin for pain, but patient deferred.   - Tylenol/Motrin okay for pain prn  - Complete 6 weeks total of lifting restrictions  - Call if new issues arise or "stinging" pain does not resolve.   - RTC PRN  -- Edison Simon, PA-C [facility] 04/29/2019, 9:35 AM [phone] M-F: 7am - 4pm

## 2019-06-19 ENCOUNTER — Other Ambulatory Visit: Payer: Self-pay

## 2019-06-19 DIAGNOSIS — Z20822 Contact with and (suspected) exposure to covid-19: Secondary | ICD-10-CM

## 2019-06-20 LAB — NOVEL CORONAVIRUS, NAA: SARS-CoV-2, NAA: NOT DETECTED

## 2022-08-11 ENCOUNTER — Emergency Department: Payer: Commercial Managed Care - PPO

## 2022-08-11 ENCOUNTER — Other Ambulatory Visit: Payer: Self-pay

## 2022-08-11 ENCOUNTER — Emergency Department
Admission: EM | Admit: 2022-08-11 | Discharge: 2022-08-11 | Disposition: A | Discharge status: Home / Self Care | Payer: Commercial Managed Care - PPO | Attending: Emergency Medicine | Admitting: Emergency Medicine

## 2022-08-11 DIAGNOSIS — K5732 Diverticulitis of large intestine without perforation or abscess without bleeding: Secondary | ICD-10-CM | POA: Insufficient documentation

## 2022-08-11 DIAGNOSIS — R109 Unspecified abdominal pain: Secondary | ICD-10-CM | POA: Diagnosis present

## 2022-08-11 DIAGNOSIS — K5792 Diverticulitis of intestine, part unspecified, without perforation or abscess without bleeding: Secondary | ICD-10-CM

## 2022-08-11 LAB — CBC WITH DIFFERENTIAL/PLATELET
Abs Immature Granulocytes: 0.01 10*3/uL (ref 0.00–0.07)
Basophils Absolute: 0 10*3/uL (ref 0.0–0.1)
Basophils Relative: 1 %
Eosinophils Absolute: 0.1 10*3/uL (ref 0.0–0.5)
Eosinophils Relative: 2 %
HCT: 46.3 % (ref 39.0–52.0)
Hemoglobin: 15.2 g/dL (ref 13.0–17.0)
Immature Granulocytes: 0 %
Lymphocytes Relative: 44 %
Lymphs Abs: 1.8 10*3/uL (ref 0.7–4.0)
MCH: 27.7 pg (ref 26.0–34.0)
MCHC: 32.8 g/dL (ref 30.0–36.0)
MCV: 84.3 fL (ref 80.0–100.0)
Monocytes Absolute: 0.4 10*3/uL (ref 0.1–1.0)
Monocytes Relative: 10 %
Neutro Abs: 1.8 10*3/uL (ref 1.7–7.7)
Neutrophils Relative %: 43 %
Platelets: 206 10*3/uL (ref 150–400)
RBC: 5.49 MIL/uL (ref 4.22–5.81)
RDW: 13 % (ref 11.5–15.5)
WBC: 4.1 10*3/uL (ref 4.0–10.5)
nRBC: 0 % (ref 0.0–0.2)

## 2022-08-11 LAB — COMPREHENSIVE METABOLIC PANEL
ALT: 33 U/L (ref 0–44)
AST: 35 U/L (ref 15–41)
Albumin: 4.1 g/dL (ref 3.5–5.0)
Alkaline Phosphatase: 80 U/L (ref 38–126)
Anion gap: 9 (ref 5–15)
BUN: 15 mg/dL (ref 6–20)
CO2: 25 mmol/L (ref 22–32)
Calcium: 9.2 mg/dL (ref 8.9–10.3)
Chloride: 106 mmol/L (ref 98–111)
Creatinine, Ser: 1.21 mg/dL (ref 0.61–1.24)
GFR, Estimated: >60 mL/min (ref >60)
Glucose, Bld: 100 mg/dL — ABNORMAL HIGH (ref 70–99)
Potassium: 4.6 mmol/L (ref 3.5–5.1)
Sodium: 140 mmol/L (ref 135–145)
Total Bilirubin: 1.4 mg/dL — ABNORMAL HIGH (ref 0.3–1.2)
Total Protein: 8.3 g/dL — ABNORMAL HIGH (ref 6.5–8.1)

## 2022-08-11 LAB — LIPASE, BLOOD: Lipase: 31 U/L (ref 11–51)

## 2022-08-11 MED ORDER — MORPHINE SULFATE (PF) 4 MG/ML IV SOLN
4.0000 mg | Freq: Once | INTRAVENOUS | Status: AC
  Administered 2022-08-11: 4 mg via INTRAVENOUS
  Filled 2022-08-11: qty 1

## 2022-08-11 MED ORDER — ONDANSETRON HCL 4 MG/2ML IJ SOLN
4.0000 mg | Freq: Once | INTRAMUSCULAR | Status: AC
  Administered 2022-08-11: 4 mg via INTRAVENOUS
  Filled 2022-08-11: qty 2

## 2022-08-11 MED ORDER — METRONIDAZOLE 500 MG PO TABS: 500.0000 mg | ORAL_TABLET | Freq: Three times a day (TID) | ORAL | 0 refills | Status: AC

## 2022-08-11 MED ORDER — IOHEXOL 300 MG/ML  SOLN
100.0000 mL | Freq: Once | INTRAMUSCULAR | Status: AC | PRN
  Administered 2022-08-11: 100 mL via INTRAVENOUS

## 2022-08-11 MED ORDER — OXYCODONE-ACETAMINOPHEN 5-325 MG PO TABS: 1.0000 | ORAL_TABLET | ORAL | 0 refills | Status: AC | PRN

## 2022-08-11 MED ORDER — CIPROFLOXACIN HCL 500 MG PO TABS: 500.0000 mg | ORAL_TABLET | Freq: Two times a day (BID) | ORAL | 0 refills | Status: AC

## 2022-08-11 NOTE — ED Triage Notes (Signed)
Pt presents via POV c/o lower abd pain since Sunday. Sent from [facility]. Reported pt has extensive abd surgical history.

## 2022-08-11 NOTE — Discharge Instructions (Signed)
You have some diverticulitis.  Its not severe.  I will give you some Cipro 1 pill twice a day for a week and Flagyl 1 pill 3 times a day for a week to help treat it.  Remember very rarely the Cipro can cause altered mental status and it can also very rarely cause tendons to rupture.  If you get arm or leg pain stop the Cipro and come in.  This is very very rare however.  Also antibiotics can give you bad diarrhea.  She gets bad diarrhea with fever or just repeated episodes of diarrhea come in for that as well.  Also return for increasing pain.  Or fever by itself.  I have written you a prescription for some Percocet.  This should help with the pain.  Be careful can make you constipated which will make the diverticulitis worse.  Also be careful can make you woozy do not fall and do not drive on it because the police will consider you an impaired driver if they catch you.

## 2022-08-11 NOTE — ED Provider Notes (Signed)
[facility] Provider Note    Event Date/Time   First MD Initiated Contact with Patient 08/11/22 [phone]     (approximate)   History   Abdominal Pain   HPI  Matthew Chaney is a 41 y.o. male with a history of repeated abdominal surgery starting with partial colectomy for diverticulitis and an anastomotic leak ileostomy takedown ileostomy ventral hernia repair with mesh.  He comes in today complaining of some abdominal pain and distention.  It is pressure and crampy.  Pain started on Sunday almost a week ago.  Seen here from [place] [facility].      Physical Exam   Triage Vital Signs: ED Triage Vitals [08/11/22 0902]  Enc Vitals Group     BP (!) 141/107     Pulse Rate 88     Resp 16     Temp 98.4 F (36.9 C)     Temp Source Oral     SpO2 99 %     Weight 217 lb (98.4 kg)     Height      Head Circumference      Peak Flow      Pain Score 6     Pain Loc      Pain Edu?      Excl. in GC?     Most recent vital signs: Vitals:   08/11/22 0930 08/11/22 1030  BP: (!) 152/110   Pulse: 91 83  Resp:    Temp:    SpO2: 96% 97%     General: Awake, no distress.  CV:  Good peripheral perfusion.  Heart regular rate and rhythm no audible murmurs Resp:  Normal effort.  Lungs are clear Abd:  Mildly distended bowel sounds are positive there is diffuse tenderness to palpation none to percussion Back with no CVA tenderness   ED Results / Procedures / Treatments   Labs (all labs ordered are listed, but only abnormal results are displayed) Labs Reviewed  COMPREHENSIVE METABOLIC PANEL - Abnormal; Notable for the following components:      Result Value   Glucose, Bld 100 (*)    Total Protein 8.3 (*)    Total Bilirubin 1.4 (*)    All other components within normal limits  LIPASE, BLOOD  CBC WITH DIFFERENTIAL/PLATELET  URINALYSIS, ROUTINE W REFLEX MICROSCOPIC     EKG     RADIOLOGY CT read by radiology reviewed and interpreted by me does show  diverticulitis.  PROCEDURES:  Critical Care performed:   Procedures   MEDICATIONS ORDERED IN ED: Medications  morphine (PF) 4 MG/ML injection 4 mg (4 mg Intravenous Given 08/11/22 0928)  ondansetron (ZOFRAN) injection 4 mg (4 mg Intravenous Given 08/11/22 0928)  iohexol (OMNIPAQUE) 300 MG/ML solution 100 mL (100 mLs Intravenous Contrast Given 08/11/22 0950)     IMPRESSION / MDM / ASSESSMENT AND PLAN / ED COURSE  I reviewed the triage vital signs and the nursing notes. ----------------------------------------- 10:40 AM on 08/11/2022 ----------------------------------------- Patient does not have any peritoneal signs at this point.  I will treat him with some Cipro and Flagyl.  I have warned him about side effects of Cipro including tendon rupture and altered mental status.  He has for something for pain he is allergic to Vicodin but can take oxycodone so given him some Percocet.  I have warned him about the Percocet making him constipated and making the diverticulitis worse.  Differential diagnosis includes, but is not limited to, obstruction or diverticulitis or pancreatitis are all possibilities  obstruction could be from his or hernia or failure of surgical repair could also cause this problem  Patient's presentation is most consistent with acute complicated illness / injury requiring diagnostic workup.  The patient is on the cardiac monitor to evaluate for evidence of arrhythmia and/or significant heart rate changes.  None have been seen  I did consider admission for this patient however his diverticulitis does not appear to be  severe enough to admit him.   FINAL CLINICAL IMPRESSION(S) / ED DIAGNOSES   Final diagnoses:  Diverticulitis     Rx / DC Orders   ED Discharge Orders          Ordered    oxyCODONE-acetaminophen (PERCOCET) 5-325 MG tablet  Every 4 hours PRN        08/11/22 1037    metroNIDAZOLE (FLAGYL) 500 MG tablet  3 times daily        08/11/22 1037     ciprofloxacin (CIPRO) 500 MG tablet  2 times daily        08/11/22 1037             Note:  This document was prepared using Dragon voice recognition software and may include unintentional dictation errors.   Arnaldo Natal, MD 08/11/22 [phone]
